# Patient Record
Sex: Female | Born: 1955 | Race: White | Hispanic: No | Marital: Married | Smoking: Never smoker
Health system: Southern US, Community
[De-identification: ages and names within clinical notes are randomized; demographics above are authoritative.]

## PROBLEM LIST (undated history)

## (undated) DIAGNOSIS — M1990 Unspecified osteoarthritis, unspecified site: Secondary | ICD-10-CM

## (undated) DIAGNOSIS — Z8049 Family history of malignant neoplasm of other genital organs: Secondary | ICD-10-CM

## (undated) DIAGNOSIS — N189 Chronic kidney disease, unspecified: Secondary | ICD-10-CM

## (undated) DIAGNOSIS — Z803 Family history of malignant neoplasm of breast: Secondary | ICD-10-CM

## (undated) DIAGNOSIS — Z8042 Family history of malignant neoplasm of prostate: Secondary | ICD-10-CM

## (undated) DIAGNOSIS — M797 Fibromyalgia: Secondary | ICD-10-CM

## (undated) DIAGNOSIS — Z801 Family history of malignant neoplasm of trachea, bronchus and lung: Secondary | ICD-10-CM

## (undated) DIAGNOSIS — G4730 Sleep apnea, unspecified: Secondary | ICD-10-CM

## (undated) DIAGNOSIS — K589 Irritable bowel syndrome without diarrhea: Secondary | ICD-10-CM

## (undated) DIAGNOSIS — R251 Tremor, unspecified: Secondary | ICD-10-CM

## (undated) DIAGNOSIS — Z800 Family history of malignant neoplasm of digestive organs: Secondary | ICD-10-CM

## (undated) DIAGNOSIS — R569 Unspecified convulsions: Secondary | ICD-10-CM

## (undated) DIAGNOSIS — E7800 Pure hypercholesterolemia, unspecified: Secondary | ICD-10-CM

## (undated) HISTORY — DX: Fibromyalgia: M79.7

## (undated) HISTORY — DX: Sleep apnea, unspecified: G47.30

## (undated) HISTORY — DX: Family history of malignant neoplasm of breast: Z80.3

## (undated) HISTORY — DX: Family history of malignant neoplasm of other genital organs: Z80.49

## (undated) HISTORY — DX: Irritable bowel syndrome, unspecified: K58.9

## (undated) HISTORY — DX: Tremor, unspecified: R25.1

## (undated) HISTORY — DX: Pure hypercholesterolemia, unspecified: E78.00

## (undated) HISTORY — DX: Unspecified convulsions: R56.9

## (undated) HISTORY — DX: Family history of malignant neoplasm of digestive organs: Z80.0

## (undated) HISTORY — DX: Unspecified osteoarthritis, unspecified site: M19.90

## (undated) HISTORY — DX: Chronic kidney disease, unspecified: N18.9

## (undated) HISTORY — DX: Family history of malignant neoplasm of prostate: Z80.42

## (undated) HISTORY — DX: Family history of malignant neoplasm of trachea, bronchus and lung: Z80.1

## (undated) MED FILL — Paclitaxel IV Conc 300 MG/50ML (6 MG/ML): INTRAVENOUS | Qty: 27 | Status: AC

## (undated) MED FILL — Dexamethasone Sodium Phosphate Inj 100 MG/10ML: INTRAMUSCULAR | Qty: 1 | Status: AC

---

## 2012-07-10 DIAGNOSIS — R531 Weakness: Secondary | ICD-10-CM | POA: Insufficient documentation

## 2012-07-10 DIAGNOSIS — R269 Unspecified abnormalities of gait and mobility: Secondary | ICD-10-CM

## 2012-07-10 HISTORY — DX: Weakness: R53.1

## 2012-07-10 HISTORY — DX: Unspecified abnormalities of gait and mobility: R26.9

## 2013-02-12 DIAGNOSIS — M47814 Spondylosis without myelopathy or radiculopathy, thoracic region: Secondary | ICD-10-CM | POA: Insufficient documentation

## 2013-02-12 DIAGNOSIS — B0229 Other postherpetic nervous system involvement: Secondary | ICD-10-CM | POA: Insufficient documentation

## 2013-02-12 DIAGNOSIS — IMO0002 Reserved for concepts with insufficient information to code with codable children: Secondary | ICD-10-CM

## 2013-02-12 DIAGNOSIS — M533 Sacrococcygeal disorders, not elsewhere classified: Secondary | ICD-10-CM

## 2013-02-12 DIAGNOSIS — M4714 Other spondylosis with myelopathy, thoracic region: Secondary | ICD-10-CM

## 2013-02-12 DIAGNOSIS — B0222 Postherpetic trigeminal neuralgia: Secondary | ICD-10-CM

## 2013-02-12 DIAGNOSIS — M961 Postlaminectomy syndrome, not elsewhere classified: Secondary | ICD-10-CM

## 2013-02-12 DIAGNOSIS — IMO0001 Reserved for inherently not codable concepts without codable children: Secondary | ICD-10-CM | POA: Insufficient documentation

## 2013-02-12 DIAGNOSIS — M546 Pain in thoracic spine: Secondary | ICD-10-CM

## 2013-02-12 HISTORY — DX: Postlaminectomy syndrome, not elsewhere classified: M96.1

## 2013-02-12 HISTORY — DX: Sacrococcygeal disorders, not elsewhere classified: M53.3

## 2013-02-12 HISTORY — DX: Reserved for inherently not codable concepts without codable children: IMO0001

## 2013-02-12 HISTORY — DX: Postherpetic trigeminal neuralgia: B02.22

## 2013-02-12 HISTORY — DX: Pain in thoracic spine: M54.6

## 2013-02-12 HISTORY — DX: Spondylosis without myelopathy or radiculopathy, thoracic region: M47.814

## 2013-02-12 HISTORY — DX: Other spondylosis with myelopathy, thoracic region: M47.14

## 2013-02-12 HISTORY — DX: Reserved for concepts with insufficient information to code with codable children: IMO0002

## 2013-02-12 HISTORY — DX: Other postherpetic nervous system involvement: B02.29

## 2013-05-29 DIAGNOSIS — N3946 Mixed incontinence: Secondary | ICD-10-CM | POA: Insufficient documentation

## 2013-05-29 HISTORY — DX: Mixed incontinence: N39.46

## 2013-08-30 DIAGNOSIS — G43909 Migraine, unspecified, not intractable, without status migrainosus: Secondary | ICD-10-CM | POA: Insufficient documentation

## 2013-08-30 DIAGNOSIS — G40909 Epilepsy, unspecified, not intractable, without status epilepticus: Secondary | ICD-10-CM

## 2013-08-30 DIAGNOSIS — J309 Allergic rhinitis, unspecified: Secondary | ICD-10-CM | POA: Insufficient documentation

## 2013-08-30 DIAGNOSIS — E785 Hyperlipidemia, unspecified: Secondary | ICD-10-CM | POA: Insufficient documentation

## 2013-08-30 DIAGNOSIS — M797 Fibromyalgia: Secondary | ICD-10-CM | POA: Insufficient documentation

## 2013-08-30 DIAGNOSIS — R262 Difficulty in walking, not elsewhere classified: Secondary | ICD-10-CM | POA: Insufficient documentation

## 2013-08-30 DIAGNOSIS — G4733 Obstructive sleep apnea (adult) (pediatric): Secondary | ICD-10-CM

## 2013-08-30 DIAGNOSIS — F418 Other specified anxiety disorders: Secondary | ICD-10-CM

## 2013-08-30 HISTORY — DX: Obstructive sleep apnea (adult) (pediatric): G47.33

## 2013-08-30 HISTORY — DX: Epilepsy, unspecified, not intractable, without status epilepticus: G40.909

## 2013-08-30 HISTORY — DX: Migraine, unspecified, not intractable, without status migrainosus: G43.909

## 2013-08-30 HISTORY — DX: Difficulty in walking, not elsewhere classified: R26.2

## 2013-08-30 HISTORY — DX: Allergic rhinitis, unspecified: J30.9

## 2013-08-30 HISTORY — DX: Other specified anxiety disorders: F41.8

## 2013-08-30 HISTORY — DX: Hyperlipidemia, unspecified: E78.5

## 2013-10-16 ENCOUNTER — Ambulatory Visit (HOSPITAL_COMMUNITY): Payer: Self-pay | Admitting: Psychiatry

## 2015-03-24 DIAGNOSIS — M533 Sacrococcygeal disorders, not elsewhere classified: Secondary | ICD-10-CM | POA: Insufficient documentation

## 2015-03-24 HISTORY — DX: Sacrococcygeal disorders, not elsewhere classified: M53.3

## 2015-09-07 DIAGNOSIS — Z9181 History of falling: Secondary | ICD-10-CM

## 2015-09-07 DIAGNOSIS — G4700 Insomnia, unspecified: Secondary | ICD-10-CM | POA: Insufficient documentation

## 2015-09-07 HISTORY — DX: History of falling: Z91.81

## 2015-09-07 HISTORY — DX: Insomnia, unspecified: G47.00

## 2017-01-02 HISTORY — PX: BREAST LUMPECTOMY: SHX2

## 2017-05-21 DIAGNOSIS — N6321 Unspecified lump in the left breast, upper outer quadrant: Secondary | ICD-10-CM | POA: Insufficient documentation

## 2017-05-21 DIAGNOSIS — N6091 Unspecified benign mammary dysplasia of right breast: Secondary | ICD-10-CM

## 2017-05-21 HISTORY — DX: Unspecified benign mammary dysplasia of right breast: N60.91

## 2017-05-21 HISTORY — DX: Unspecified lump in the left breast, upper outer quadrant: N63.21

## 2017-05-22 DIAGNOSIS — G2581 Restless legs syndrome: Secondary | ICD-10-CM | POA: Insufficient documentation

## 2017-05-22 HISTORY — DX: Restless legs syndrome: G25.81

## 2017-06-01 DIAGNOSIS — Z01818 Encounter for other preprocedural examination: Secondary | ICD-10-CM | POA: Diagnosis not present

## 2017-06-19 DIAGNOSIS — Z09 Encounter for follow-up examination after completed treatment for conditions other than malignant neoplasm: Secondary | ICD-10-CM | POA: Insufficient documentation

## 2017-06-19 HISTORY — DX: Encounter for follow-up examination after completed treatment for conditions other than malignant neoplasm: Z09

## 2017-07-03 DIAGNOSIS — G4719 Other hypersomnia: Secondary | ICD-10-CM

## 2017-07-03 HISTORY — DX: Other hypersomnia: G47.19

## 2017-10-26 DIAGNOSIS — Z818 Family history of other mental and behavioral disorders: Secondary | ICD-10-CM | POA: Insufficient documentation

## 2017-10-26 HISTORY — DX: Family history of other mental and behavioral disorders: Z81.8

## 2017-12-20 ENCOUNTER — Encounter: Payer: Self-pay | Admitting: Gastroenterology

## 2018-03-13 HISTORY — PX: BLADDER SUSPENSION: SHX72

## 2018-03-13 HISTORY — PX: BLADDER REPAIR: SHX76

## 2018-11-05 DIAGNOSIS — R928 Other abnormal and inconclusive findings on diagnostic imaging of breast: Secondary | ICD-10-CM

## 2018-11-05 HISTORY — DX: Other abnormal and inconclusive findings on diagnostic imaging of breast: R92.8

## 2018-12-19 ENCOUNTER — Encounter: Payer: Self-pay | Admitting: Gastroenterology

## 2019-01-02 ENCOUNTER — Other Ambulatory Visit: Payer: Self-pay

## 2019-01-02 ENCOUNTER — Ambulatory Visit (INDEPENDENT_AMBULATORY_CARE_PROVIDER_SITE_OTHER): Payer: Medicare Other | Admitting: Allergy and Immunology

## 2019-01-02 ENCOUNTER — Encounter: Payer: Self-pay | Admitting: Allergy and Immunology

## 2019-01-02 VITALS — BP 108/84 | HR 104 | Resp 16 | Ht 64.75 in | Wt 203.0 lb

## 2019-01-02 DIAGNOSIS — T782XXD Anaphylactic shock, unspecified, subsequent encounter: Secondary | ICD-10-CM | POA: Diagnosis not present

## 2019-01-02 DIAGNOSIS — T50905D Adverse effect of unspecified drugs, medicaments and biological substances, subsequent encounter: Secondary | ICD-10-CM

## 2019-01-02 NOTE — Progress Notes (Signed)
[facility] [place] [facility] [place]   Follow-up Note  Referring Provider: No ref. provider found Primary Provider: Enid Skeens., MD Date of Office Visit: 01/02/2019  Subjective:   Brandy Maxwell (DOB: 08-16-1955) is a 63 y.o. female who returns to the [facility] on 01/02/2019 in re-evaluation of the following:  HPI: [name] presents to this clinic in evaluation of a recent allergic reaction.  Apparently 1 week ago after her first dose of nitrofurantoin she developed a rather significant reaction manifested as global urticaria and global itching and then she passed out and had a low blood pressure and diarrhea and was evaluated emergency room and admitted to the hospital for treatment including the administration of injectable epinephrine and steroids.  This was her first exposure to nitrofurantoin other than having a few doses earlier this year.  Fortunately, her reaction resolved and she has not had any sequela.  However, since her discharge from the hospital she has been having rather significant pelvic pain.  Her nitrofurantoin was administered for a urinary tract infection and she thinks that she did receive other antibiotics to address this infection when in the hospital.  She has an appointment to see her urologist who performed a bladder sling surgery in March 2020 sometime in the next week or 2.  In October 2020 she apparently developed a sensation of throat swelling and difficulty swallowing and once again required evaluation emergency room.  With this episode she did not have any associated systemic or constitutional symptoms and there was no obvious provoking factor giving rise to that issue.  As well, she has a very complex medical history dating back about 8 years ago.  Apparently she was diagnosed with "shingles in her spine" and ever since that point in time she has had rather significant neurological issues including urinary incontinence,  global muscle weakness, vertigo, chronic pain syndrome, and a fair amount of cognitive difficulties that has been evaluated by multiple medical centers.  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  And she developed sleep apnea around that point in time.  In addition, she has reflux with regurgitation that she treats with Tums on occasion.  She also notes the fact that her voice gets very weak the more that she talks throughout the day.  Allergies as of 01/02/2019      Reactions   Nitrofurantoin Anaphylaxis   Sulfa Antibiotics Rash, Other (See Comments), Hives   Other reaction(s): Rash      Medication List      b complex vitamins capsule Take by mouth.   chlorhexidine 0.12 % solution Commonly known as: PERIDEX USE AS DIRECTED ON BOTTLE   [employer]S VITAMIN B12 1000 MCG tablet Generic drug: cyanocobalamin Take 1,000 mcg by mouth daily.   DULoxetine 30 MG capsule Commonly known as: CYMBALTA Take 90 mg by mouth at bedtime.   EPINEPHrine 0.3 mg/0.3 mL Soaj injection Commonly known as: EPI-PEN PLEASE SEE ATTACHED FOR DETAILED DIRECTIONS   fenofibrate 160 MG tablet Take 160 mg by mouth daily.   fluticasone 50 MCG/ACT nasal spray Commonly known as: FLONASE 1 spray.   gabapentin 600 MG tablet Commonly known as: NEURONTIN Take 1,200 mg by mouth 3 (three) times daily.   lidocaine 5 % Commonly known as: [place] onto the skin.   Linzess 72 MCG capsule Generic drug: linaclotide Take 72 mcg by mouth daily.   modafinil 200 MG tablet  Commonly known as: PROVIGIL Take 200 mg by mouth 2 (two) times daily.   mupirocin cream 2 % Commonly known as: BACTROBAN APPLY TO AFFECTED AREA 3 TIMES A DAY FOR 7 DAYS   oxybutynin 10 MG 24 hr tablet Commonly known as: DITROPAN-XL Take 10 mg by mouth daily.   pramipexole 0.25 MG tablet Commonly known as: MIRAPEX Take 0.5 mg by mouth at bedtime.   traMADol 50 MG  tablet Commonly known as: ULTRAM Take 50 mg by mouth 2 (two) times daily as needed.   Vitamin D (Ergocalciferol) 1.25 MG (50000 UT) Caps capsule Commonly known as: DRISDOL TAKE 1 CAPSULE BY MOUTH ONCE A WEEK FOR 30 DAYS       Past Medical History:  Diagnosis Date  . Fibromyalgia   . Hypercholesteremia   . IBS (irritable bowel syndrome)   . Seizures ([place])   . Tremors of nervous system     Past Surgical History:  Procedure Laterality Date  . BLADDER REPAIR  03/13/2018  . BLADDER SUSPENSION  03/13/2018  . BREAST LUMPECTOMY  2019  . LAMINECTOMY  1998    Review of systems negative except as noted in HPI / PMHx or noted below:  Review of Systems  Constitutional: Negative.   HENT: Negative.   Eyes: Negative.   Respiratory: Negative.   Cardiovascular: Negative.   Gastrointestinal: Negative.   Genitourinary: Negative.   Musculoskeletal: Negative.   Skin: Negative.   Neurological: Negative.   Endo/Heme/Allergies: Negative.   Psychiatric/Behavioral: Negative.      Objective:   Vitals:   01/02/19 1411  BP: 108/84  Pulse: (!) 104  Resp: 16  SpO2: 97%   Height: 5' 4.75" (164.5 cm)  Weight: 203 lb (92.1 kg)   Physical Exam Constitutional:      Appearance: She is not diaphoretic.     Comments: Obvious difficulty with muscle weakness while ambulating using a cane.  HENT:     Head: Normocephalic.     Right Ear: Tympanic membrane, ear canal and external ear normal.     Left Ear: Tympanic membrane, ear canal and external ear normal.     Nose: Nose normal. No mucosal edema or rhinorrhea.     Mouth/Throat:     Pharynx: Uvula midline. No oropharyngeal exudate.  Eyes:     Conjunctiva/sclera: Conjunctivae normal.  Neck:     Thyroid: No thyromegaly.     Trachea: Trachea normal. No tracheal tenderness or tracheal deviation.  Cardiovascular:     Rate and Rhythm: Normal rate and regular rhythm.     Heart sounds: Normal heart sounds, S1 normal and S2 normal. No murmur.    Pulmonary:     Effort: No respiratory distress.     Breath sounds: Normal breath sounds. No stridor. No wheezing or rales.  Lymphadenopathy:     Head:     Right side of head: No tonsillar adenopathy.     Left side of head: No tonsillar adenopathy.     Cervical: No cervical adenopathy.  Skin:    Findings: No erythema or rash.     Nails: There is no clubbing.  Neurological:     Mental Status: She is alert.     Diagnostics: none  Assessment and Plan:   No diagnosis found.  There are no Patient Instructions on file for this visit.  Allena Katz, MD Allergy / Immunology [place]

## 2019-01-06 ENCOUNTER — Encounter: Payer: Self-pay | Admitting: Allergy and Immunology

## 2019-01-06 NOTE — Progress Notes (Signed)
[facility] [place]   NEW PATIENT NOTE  Referring Provider: No ref. provider found Primary Provider: Enid Skeens., MD Date of office visit: 01/02/2019    Subjective:   Chief Complaint:  Brandy Maxwell (DOB: 02-May-1955) is a 64 y.o. female who presents to the clinic on 01/02/2019 with a chief complaint of Anaphylaxis and Allergic Reaction .  HPI:  Brandy Maxwell presents to this clinic in evaluation of a recent allergic reaction.  Apparently 1 week ago, after her first dose of nitrofurantoin, she developed a rather significant reaction manifested as global urticaria and global itching and then she passed out and had a low blood pressure and diarrhea and was evaluated emergency room and admitted to the hospital for treatment including the administration of injectable epinephrine and steroids.  This was her first exposure to nitrofurantoin other than having a few doses earlier this year.  Fortunately, her reaction resolved and she has not had any sequela.  However, since her discharge from the hospital she has been having rather significant pelvic pain.  Her nitrofurantoin was administered for a urinary tract infection and she thinks that she did receive other antibiotics to address this infection when in the hospital.  She has an appointment to see her urologist who performed a bladder sling surgery in March 2020 sometime in the next week or 2.  In October 2020 she apparently developed a sensation of throat swelling and difficulty swallowing and once again required evaluation emergency room.  With this episode she did not have any associated systemic or constitutional symptoms and there was no obvious provoking factor giving rise to that issue.  As well, she has a very complex medical history dating back about 8 years ago.  Apparently she was diagnosed with "shingles in her spine" and ever since that point in time she has had rather significant neurological  issues including urinary incontinence, global muscle weakness, vertigo, chronic pain syndrome, and a fair amount of cognitive difficulties that has been evaluated by multiple medical centers.  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  And she developed sleep apnea around that point in time.  In addition, she has reflux with regurgitation that she treats with Tums on occasion.  She also notes the fact that her voice gets very weak the more that she talks throughout the day.   Past Medical History:  Diagnosis Date  . Fibromyalgia   . Hypercholesteremia   . IBS (irritable bowel syndrome)   . Seizures ([place])   . Tremors of nervous system     Past Surgical History:  Procedure Laterality Date  . BLADDER REPAIR  03/13/2018  . BLADDER SUSPENSION  03/13/2018  . BREAST LUMPECTOMY  2019  . LAMINECTOMY  1998    Allergies as of 01/02/2019      Reactions   Nitrofurantoin Anaphylaxis   Sulfa Antibiotics Rash, Other (See Comments), Hives   Other reaction(s): Rash      Medication List      b complex vitamins capsule Take by mouth.   chlorhexidine 0.12 % solution Commonly known as: PERIDEX USE AS DIRECTED ON BOTTLE   CVS VITAMIN B12 1000 MCG tablet Generic drug: cyanocobalamin Take 1,000 mcg by mouth daily.   DULoxetine 30 MG capsule Commonly known as: CYMBALTA Take 90 mg by mouth at bedtime.   EPINEPHrine 0.3 mg/0.3 mL Soaj injection Commonly known as: EPI-PEN PLEASE SEE ATTACHED FOR  DETAILED DIRECTIONS   fenofibrate 160 MG tablet Take 160 mg by mouth daily.   fluticasone 50 MCG/ACT nasal spray Commonly known as: FLONASE 1 spray.   gabapentin 600 MG tablet Commonly known as: NEURONTIN Take 1,200 mg by mouth 3 (three) times daily.   lidocaine 5 % Commonly known as: [place] onto the skin.   Linzess 72 MCG capsule Generic drug: linaclotide Take 72 mcg by mouth daily.   modafinil 200 MG  tablet Commonly known as: PROVIGIL Take 200 mg by mouth 2 (two) times daily.   mupirocin cream 2 % Commonly known as: BACTROBAN APPLY TO AFFECTED AREA 3 TIMES A DAY FOR 7 DAYS   oxybutynin 10 MG 24 hr tablet Commonly known as: DITROPAN-XL Take 10 mg by mouth daily.   pramipexole 0.25 MG tablet Commonly known as: MIRAPEX Take 0.5 mg by mouth at bedtime.   traMADol 50 MG tablet Commonly known as: ULTRAM Take 50 mg by mouth 2 (two) times daily as needed.   Vitamin D (Ergocalciferol) 1.25 MG (50000 UT) Caps capsule Commonly known as: DRISDOL TAKE 1 CAPSULE BY MOUTH ONCE A WEEK FOR 30 DAYS       Review of systems negative except as noted in HPI / PMHx or noted below:  Review of Systems  Constitutional: Negative.   HENT: Negative.   Eyes: Negative.   Respiratory: Negative.   Cardiovascular: Negative.   Gastrointestinal: Negative.   Genitourinary: Negative.   Musculoskeletal: Negative.   Skin: Negative.   Neurological: Negative.   Endo/Heme/Allergies: Negative.   Psychiatric/Behavioral: Negative.     Family History  Problem Relation Age of Onset  . Hypercholesterolemia Mother   . Alzheimer's disease Mother   . Heart Problems Father   . Clotting disorder Father   . Alzheimer's disease Father   . Allergic rhinitis Sister     Social History   Socioeconomic History  . Marital status: Married    Spouse name: Not on file  . Number of children: Not on file  . Years of education: Not on file  . Highest education level: Not on file  Occupational History  . Not on file  Tobacco Use  . Smoking status: Never Smoker  . Smokeless tobacco: Never Used  Substance and Sexual Activity  . Alcohol use: Never  . Drug use: Never  . Sexual activity: Not on file  Other Topics Concern  . Not on file  Social History Narrative  . Not on file   Social Determinants of Health   Financial Resource Strain:   . Difficulty of Paying Living Expenses: Not on file  Food Insecurity:    . Worried About Charity fundraiser in the Last Year: Not on file  . Ran Out of Food in the Last Year: Not on file  Transportation Needs:   . Lack of Transportation (Medical): Not on file  . Lack of Transportation (Non-Medical): Not on file  Physical Activity:   . Days of Exercise per Week: Not on file  . Minutes of Exercise per Session: Not on file  Stress:   . Feeling of Stress : Not on file  Social Connections:   . Frequency of Communication with Friends and Family: Not on file  . Frequency of Social Gatherings with Friends and Family: Not on file  . Attends Religious Services: Not on file  . Active Member of Clubs or Organizations: Not on file  . Attends Archivist Meetings: Not on file  . Marital Status: Not  on file  Intimate Partner Violence:   . Fear of Current or Ex-Partner: Not on file  . Emotionally Abused: Not on file  . Physically Abused: Not on file  . Sexually Abused: Not on file    Environmental and Social history  Lives in a house with a dry environment, no animals located inside the household, no carpet in the bedroom, no plastic on the bed, no plastic on the pillow, no smokers located inside the household.  Objective:   Vitals:   01/02/19 1411  BP: 108/84  Pulse: (!) 104  Resp: 16  SpO2: 97%   Height: 5' 4.75" (164.5 cm) Weight: 203 lb (92.1 kg)  Physical Exam Constitutional:      Appearance: She is not diaphoretic.     Comments: Obvious weakness ambulating with a cane  HENT:     Head: Normocephalic.     Right Ear: Tympanic membrane, ear canal and external ear normal.     Left Ear: Tympanic membrane, ear canal and external ear normal.     Nose: Nose normal. No mucosal edema or rhinorrhea.     Mouth/Throat:     Pharynx: Uvula midline. No oropharyngeal exudate.  Eyes:     Conjunctiva/sclera: Conjunctivae normal.  Neck:     Thyroid: No thyromegaly.     Trachea: Trachea normal. No tracheal tenderness or tracheal deviation.    Cardiovascular:     Rate and Rhythm: Normal rate and regular rhythm.     Heart sounds: Normal heart sounds, S1 normal and S2 normal. No murmur.  Pulmonary:     Effort: No respiratory distress.     Breath sounds: Normal breath sounds. No stridor. No wheezing or rales.  Lymphadenopathy:     Head:     Right side of head: No tonsillar adenopathy.     Left side of head: No tonsillar adenopathy.     Cervical: No cervical adenopathy.  Skin:    Findings: No erythema or rash.     Nails: There is no clubbing.  Neurological:     Mental Status: She is alert.     Diagnostics: Allergy skin tests were not performed.   Assessment and Plan:    1. Anaphylaxis, subsequent encounter   2. Adverse effect of drug, subsequent encounter    [place] had an obvious episode of anaphylaxis directed against her nitrofurantoin and this medication should never be used again in the future and she should be labeled as severely allergic to this agent.  I have informed her that if she has any allergic reactions in the future unrelated to the use of this medication then she will require further evaluation for her potentially overactive immune system.  The issue of why she has very significant neurological and muscular issues is not well-defined.  She has had extensive evaluation with neurology including nerve conduction velocity studies and screening blood test for a paraneoplastic syndrome and myasthenia gravis all of which have been negative.  I cannot really offer anything at this point in time regarding further evaluation for the etiologic factor responsible for her neurological issues.  Allena Katz, MD Allergy / Immunology [place]

## 2019-01-08 ENCOUNTER — Encounter: Payer: Self-pay | Admitting: Gastroenterology

## 2019-01-20 ENCOUNTER — Encounter: Payer: Self-pay | Admitting: Gastroenterology

## 2019-01-31 DIAGNOSIS — Z79899 Other long term (current) drug therapy: Secondary | ICD-10-CM

## 2019-01-31 HISTORY — DX: Other long term (current) drug therapy: Z79.899

## 2019-02-03 ENCOUNTER — Encounter: Payer: Self-pay | Admitting: Gastroenterology

## 2019-02-03 ENCOUNTER — Telehealth (INDEPENDENT_AMBULATORY_CARE_PROVIDER_SITE_OTHER): Payer: Medicare Other | Admitting: Gastroenterology

## 2019-02-03 ENCOUNTER — Other Ambulatory Visit: Payer: Self-pay

## 2019-02-03 VITALS — Ht 60.0 in | Wt 190.0 lb

## 2019-02-03 DIAGNOSIS — K581 Irritable bowel syndrome with constipation: Secondary | ICD-10-CM | POA: Diagnosis not present

## 2019-02-03 DIAGNOSIS — G8929 Other chronic pain: Secondary | ICD-10-CM

## 2019-02-03 DIAGNOSIS — Z800 Family history of malignant neoplasm of digestive organs: Secondary | ICD-10-CM | POA: Diagnosis not present

## 2019-02-03 DIAGNOSIS — R102 Pelvic and perineal pain: Secondary | ICD-10-CM | POA: Diagnosis not present

## 2019-02-03 DIAGNOSIS — Z01818 Encounter for other preprocedural examination: Secondary | ICD-10-CM | POA: Diagnosis not present

## 2019-02-03 MED ORDER — SUPREP BOWEL PREP KIT 17.5-3.13-1.6 GM/177ML PO SOLN: 1.0000 | ORAL | 0 refills | Status: DC

## 2019-02-03 NOTE — Patient Instructions (Signed)
If you are age 63 or older, your body mass index should be between 23-30. Your Body mass index is 37.11 kg/m. If this is out of the aforementioned range listed, please consider follow up with your Primary Care Provider.  If you are age 2 or younger, your body mass index should be between 19-25. Your Body mass index is 37.11 kg/m. If this is out of the aformentioned range listed, please consider follow up with your Primary Care Provider.   It has been recommended to you by your physician that you have a(n) Colonoscopy completed. Per your request, we did not schedule the procedure(s) today. Please contact our office at [phone] should you decide to have the procedure completed. You will be scheduled for a pre-visit and procedure at that time.  Two days before your procedure: Mix 3 packs (or capfuls) of Miralax in 48 ounces of clear liquid and drink at 6pm.  Continue Linzess   Please purchase the following medications over the counter and take as directed: Miralax 17 grams once daily.    Kegel Exercises  Kegel exercises can help strengthen your pelvic floor muscles. The pelvic floor is a group of muscles that support your rectum, small intestine, and bladder. In females, pelvic floor muscles also help support the womb (uterus). These muscles help you control the flow of urine and stool. Kegel exercises are painless and simple, and they do not require any equipment. Your provider may suggest Kegel exercises to:  Improve bladder and bowel control.  Improve sexual response.  Improve weak pelvic floor muscles after surgery to remove the uterus (hysterectomy) or pregnancy (females).  Improve weak pelvic floor muscles after prostate gland removal or surgery (males). Kegel exercises involve squeezing your pelvic floor muscles, which are the same muscles you squeeze when you try to stop the flow of urine or keep from passing gas. The exercises can be done while sitting, standing, or lying down,  but it is best to vary your position. Exercises How to do Kegel exercises: 1. Squeeze your pelvic floor muscles tight. You should feel a tight lift in your rectal area. If you are a female, you should also feel a tightness in your vaginal area. Keep your stomach, buttocks, and legs relaxed. 2. Hold the muscles tight for up to 10 seconds. 3. Breathe normally. 4. Relax your muscles. 5. Repeat as told by your health care provider. Repeat this exercise daily as told by your health care provider. Continue to do this exercise for at least 4-6 weeks, or for as long as told by your health care provider. You may be referred to a physical therapist who can help you learn more about how to do Kegel exercises. Depending on your condition, your health care provider may recommend:  Varying how long you squeeze your muscles.  Doing several sets of exercises every day.  Doing exercises for several weeks.  Making Kegel exercises a part of your regular exercise routine. This information is not intended to replace advice given to you by your health care provider. Make sure you discuss any questions you have with your health care provider. Document Revised: 08/08/2017 Document Reviewed: 08/08/2017 Elsevier Patient Education  2020 [employer].   Thank you,  Dr. Jackquline Denmark

## 2019-02-03 NOTE — Progress Notes (Signed)
Chief Complaint:   Referring Provider:  Enid Skeens., MD      ASSESSMENT AND PLAN;     #1. Chronic pelvic pain. S/P bladder urethral sling 03/2018.  Being followed by urology (Dr. Venia Minks)  #2. IBS with constipation   #3. FH colon cancer MGM.  Plan: -Linzess 72 mcg p.o. once a day to continue. -Add MiraLAX 17 g p.o. once a day. -Kegel's exercises -Proceed with colonoscopy (2 day prep) for further evaluation as recommended by Dr. Wendie Agreste.  I have discussed risks and benefits. -If still with urinary problems, reappt with [facility] for UDS/Botox bladder injection. -If still with problems and the above WU is negative, proceed with CT scan abdo/pelvis.    HPI:    Brandy Maxwell is a 64 y.o. female with chronic fatigue syndrome, fibromyalgia, chronic pain syndrome being followed by pain clinic, H LD, OSA on CPAP, seizure disorder with longstanding urinary problems including urinary incontinence SP retropubic mid urethral sling on 03/13/2018 with continued urge incontinence and chronic pelvic pain.  Also has longstanding history of constipation with pellet-like stools.  Most recently she has been taking Linzess 72 mcg p.o. once a day and would still be constipated with bowel movements at the frequency of 1 per 2 to 3 days.  She occasionally would have diarrhea.  No melena or hematochezia.  She attributes part of the pelvic pain to "colonic spasms".  Very much concerned as her maternal grandmother had colon cancer.  Advised to get colonoscopy performed.  Past GI procedures: -Colonoscopy 12/2012 ([facility])-mild sigmoid diverticulosis, small internal hemorrhoids.  Advised repeat in 46yr due to [place] Past Medical History:  Diagnosis Date  . Fibromyalgia   . Hypercholesteremia   . IBS (irritable bowel syndrome)   . Seizures ([place])   . Tremors of nervous system     Past Surgical History:  Procedure Laterality Date  . BLADDER REPAIR  03/13/2018  . BLADDER SUSPENSION   03/13/2018  . BREAST LUMPECTOMY  2019  . COLONOSCOPY  12/20/2012   Left colonic diverticulosis predominantly in the sigmoid colon. Small internal hemmorhoids. Otherwise normal colonoscopy.   Marland Kitchen LAMINECTOMY  1998    Family History  Problem Relation Age of Onset  . Hypercholesterolemia Mother   . Alzheimer's disease Mother   . Heart Problems Father   . Clotting disorder Father   . Alzheimer's disease Father   . Allergic rhinitis Sister   . Colon cancer Maternal Grandfather   . Uterine cancer Maternal Aunt   . Esophageal cancer Neg Hx     Social History   Tobacco Use  . Smoking status: Never Smoker  . Smokeless tobacco: Never Used  Substance Use Topics  . Alcohol use: Never  . Drug use: Never    Current Outpatient Medications  Medication Sig Dispense Refill  . b complex vitamins capsule Take 1 capsule by mouth daily.     . chlorhexidine (PERIDEX) 0.12 % solution Use as directed in the mouth or throat daily.     . CVS VITAMIN B12 1000 MCG tablet Take 1,000 mcg by mouth daily.    . DULoxetine (CYMBALTA) 30 MG capsule Take 90 mg by mouth at bedtime.    . fenofibrate 160 MG tablet Take 160 mg by mouth daily.    . fluticasone (FLONASE) 50 MCG/ACT nasal spray Place 1 spray into both nostrils as needed.     . gabapentin (NEURONTIN) 600 MG tablet Take 1,200 mg by mouth 3 (three) times daily.    Marland Kitchen  lidocaine (LIDODERM) 5 % Place 1 patch onto the skin as needed.     Marland Kitchen LINZESS 72 MCG capsule Take 72 mcg by mouth daily.    . modafinil (PROVIGIL) 200 MG tablet Take 200 mg by mouth 2 (two) times daily.    . mupirocin cream (BACTROBAN) 2 % Apply 1 application topically as needed.     Marland Kitchen oxybutynin (DITROPAN-XL) 10 MG 24 hr tablet Take 10 mg by mouth daily.    . pramipexole (MIRAPEX) 0.25 MG tablet Take 0.5 mg by mouth at bedtime.    . traMADol (ULTRAM) 50 MG tablet Take 50 mg by mouth 2 (two) times daily as needed.    . Vitamin D, Ergocalciferol, (DRISDOL) 1.25 MG (50000 UT) CAPS capsule  TAKE 1 CAPSULE BY MOUTH ONCE A WEEK FOR 30 DAYS    . EPINEPHrine 0.3 mg/0.3 mL IJ SOAJ injection PLEASE SEE ATTACHED FOR DETAILED DIRECTIONS     No current facility-administered medications for this visit.    Allergies  Allergen Reactions  . Nitrofurantoin Anaphylaxis  . Sulfa Antibiotics Rash, Other (See Comments) and Hives    Other reaction(s): Rash     Review of Systems:  Constitutional: Denies fever, chills, diaphoresis, appetite change and has fatigue.  HEENT: neg   Respiratory: Denies SOB, DOE, cough, chest tightness,  and wheezing.   Cardiovascular: Denies chest pain, palpitations and leg swelling.  Genitourinary: Denies dysuria, urgency, frequency, hematuria, flank pain and difficulty urinating.  Musculoskeletal: has myalgias, back pain, joint swelling, arthralgias and gait problem.  Skin: No rash.  Neurological: Denies dizziness, had seizures, syncope, weakness, light-headedness, numbness and headaches.  Hematological: Denies adenopathy. Easy bruising, personal or family bleeding history  Psychiatric/Behavioral: has  anxiety or depression     Physical Exam:    Ht 5' (1.524 m)   Wt 190 lb (86.2 kg)   BMI 37.11 kg/m  Wt Readings from Last 3 Encounters:  02/03/19 190 lb (86.2 kg)  01/02/19 203 lb (92.1 kg)   Not examined since it was a televisit  She was originally scheduled to have visit in person.  However, she got confused with the appointments and went to [facility].  Hence we did televisit.  I connected with  Brandy Maxwell on 02/03/19 by a (video not available) phone telemedicine application and verified that I am speaking with the correct person using two identifiers.   I discussed the limitations of evaluation and management by telemedicine. The patient expressed understanding and agreed to proceed.  Extensive notes were reviewed     Carmell Austria, MD 02/03/2019, 2:24 PM  Cc: Enid Skeens., MD

## 2019-02-12 ENCOUNTER — Ambulatory Visit (AMBULATORY_SURGERY_CENTER): Payer: Self-pay | Admitting: *Deleted

## 2019-02-12 ENCOUNTER — Other Ambulatory Visit: Payer: Self-pay

## 2019-02-12 VITALS — Temp 97.6°F | Ht 65.0 in | Wt 195.0 lb

## 2019-02-12 DIAGNOSIS — Z01818 Encounter for other preprocedural examination: Secondary | ICD-10-CM

## 2019-02-12 DIAGNOSIS — Z1211 Encounter for screening for malignant neoplasm of colon: Secondary | ICD-10-CM

## 2019-02-12 NOTE — Progress Notes (Signed)

## 2019-02-21 ENCOUNTER — Other Ambulatory Visit: Payer: Self-pay

## 2019-02-21 ENCOUNTER — Other Ambulatory Visit: Payer: Self-pay | Admitting: Gastroenterology

## 2019-02-21 ENCOUNTER — Telehealth: Payer: Self-pay | Admitting: Gastroenterology

## 2019-02-21 ENCOUNTER — Ambulatory Visit (INDEPENDENT_AMBULATORY_CARE_PROVIDER_SITE_OTHER): Payer: Medicare Other

## 2019-02-21 DIAGNOSIS — Z1159 Encounter for screening for other viral diseases: Secondary | ICD-10-CM

## 2019-02-21 NOTE — Telephone Encounter (Signed)
Sent  Prep instructions via my chart  Called pt- informed resent in my chart

## 2019-02-21 NOTE — Telephone Encounter (Signed)
Pt is scheduled for a colon and stated that she has lost her instructions.  Pt requested for instructions to be resent through [facility].

## 2019-02-22 LAB — SARS CORONAVIRUS 2 (TAT 6-24 HRS): SARS Coronavirus 2: NEGATIVE

## 2019-02-25 ENCOUNTER — Encounter: Payer: Self-pay | Admitting: Gastroenterology

## 2019-02-25 ENCOUNTER — Other Ambulatory Visit: Payer: Self-pay

## 2019-02-25 ENCOUNTER — Ambulatory Visit (AMBULATORY_SURGERY_CENTER): Payer: Medicare Other | Admitting: Gastroenterology

## 2019-02-25 VITALS — BP 108/57 | HR 98 | Temp 97.1°F | Resp 14 | Ht 65.0 in | Wt 195.0 lb

## 2019-02-25 DIAGNOSIS — Z800 Family history of malignant neoplasm of digestive organs: Secondary | ICD-10-CM | POA: Diagnosis not present

## 2019-02-25 DIAGNOSIS — Z1211 Encounter for screening for malignant neoplasm of colon: Secondary | ICD-10-CM

## 2019-02-25 MED ORDER — SODIUM CHLORIDE 0.9 % IV SOLN: 500.0000 mL | Freq: Once | INTRAVENOUS | Status: DC

## 2019-02-25 NOTE — Patient Instructions (Signed)
YOU HAD AN ENDOSCOPIC PROCEDURE TODAY AT [facility]:   Refer to the procedure report that was given to you for any specific questions about what was found during the examination.  If the procedure report does not answer your questions, please call your gastroenterologist to clarify.  If you requested that your care partner not be given the details of your procedure findings, then the procedure report has been included in a sealed envelope for you to review at your convenience later.  YOU SHOULD EXPECT: Some feelings of bloating in the abdomen. Passage of more gas than usual.  Walking can help get rid of the air that was put into your GI tract during the procedure and reduce the bloating. If you had a lower endoscopy (such as a colonoscopy or flexible sigmoidoscopy) you may notice spotting of blood in your stool or on the toilet paper. If you underwent a bowel prep for your procedure, you may not have a normal bowel movement for a few days.  Please Note:  You might notice some irritation and congestion in your nose or some drainage.  This is from the oxygen used during your procedure.  There is no need for concern and it should clear up in a day or so.  SYMPTOMS TO REPORT IMMEDIATELY:   Following lower endoscopy (colonoscopy or flexible sigmoidoscopy):  Excessive amounts of blood in the stool  Significant tenderness or worsening of abdominal pains  Swelling of the abdomen that is new, acute  Fever of 100F or higher   For urgent or emergent issues, a gastroenterologist can be reached at any hour by calling [phone].   DIET:  Please follow a High Fiber Diet (see handout given to you by your recovery nurse).We do recommend a small meal at first, but then you may proceed to your regular diet.  Drink plenty of fluids but you should avoid alcoholic beverages for 24 hours.  MEDICATIONS: Continue present medications.  Follow up with Dr. Lyndel Safe in his office as  needed.  ACTIVITY:  You should plan to take it easy for the rest of today and you should NOT DRIVE or use heavy machinery until tomorrow (because of the sedation medicines used during the test).    FOLLOW UP: Our staff will call the number listed on your records 48-72 hours following your procedure to check on you and address any questions or concerns that you may have regarding the information given to you following your procedure. If we do not reach you, we will leave a message.  We will attempt to reach you two times.  During this call, we will ask if you have developed any symptoms of COVID 19. If you develop any symptoms (ie: fever, flu-like symptoms, shortness of breath, cough etc.) before then, please call [phone].  If you test positive for Covid 19 in the 2 weeks post procedure, please call and report this information to [place].    If any biopsies were taken you will be contacted by phone or by letter within the next 1-3 weeks.  Please call us at [phone] if you have not heard about the biopsies in 3 weeks.   Thank you for allowing [place] to provide for your healthcare needs today.   SIGNATURES/CONFIDENTIALITY: You and/or your care partner have signed paperwork which will be entered into your electronic medical record.  These signatures attest to the fact that that the information above on your After Visit Summary has been reviewed and is understood.  Full responsibility of the confidentiality of this discharge information lies with you and/or your care-partner.

## 2019-02-25 NOTE — Progress Notes (Signed)
PT taken to PACU. Monitors in place. VSS. Report given to RN. 

## 2019-02-25 NOTE — Op Note (Signed)
[facility] Patient Name: Brandy Maxwell Procedure Date: 02/25/2019 10:56 AM MRN: [id number] Endoscopist: Jackquline Denmark , MD Age: 64 Referring MD:  Date of Birth: 1955/11/23 Gender: Female Account #: [id number] Procedure:                Colonoscopy Indications:              Screening for colorectal malignant neoplasm. FH of                            colon cancer in a second-degree relative. Medicines:                Monitored Anesthesia Care Procedure:                Pre-Anesthesia Assessment:                           - Prior to the procedure, a History and Physical                            was performed, and patient medications and                            allergies were reviewed. The patient's tolerance of                            previous anesthesia was also reviewed. The risks                            and benefits of the procedure and the sedation                            options and risks were discussed with the patient.                            All questions were answered, and informed consent                            was obtained. Prior Anticoagulants: The patient has                            taken no previous anticoagulant or antiplatelet                            agents. ASA Grade Assessment: II - A patient with                            mild systemic disease. After reviewing the risks                            and benefits, the patient was deemed in                            satisfactory condition to undergo the procedure.  After obtaining informed consent, the colonoscope                            was passed under direct vision. Throughout the                            procedure, the patient's blood pressure, pulse, and                            oxygen saturations were monitored continuously. The                            Colonoscope was introduced through the anus and                            advanced to the 2 cm  into the ileum. The                            colonoscopy was performed without difficulty. The                            patient tolerated the procedure well. The quality                            of the bowel preparation was good. The terminal                            ileum, ileocecal valve, appendiceal orifice, and                            rectum were photographed. Scope In: 11:03:34 AM Scope Out: 11:20:11 AM Scope Withdrawal Time: 0 hours 12 minutes 20 seconds  Total Procedure Duration: 0 hours 16 minutes 37 seconds  Findings:                 A few small-mouthed diverticula were found in the                            sigmoid colon and descending colon.                           Non-bleeding internal hemorrhoids were found during                            retroflexion. The hemorrhoids were small.                           The terminal ileum appeared normal.                           The exam was otherwise without abnormality on                            direct and retroflexion views. Complications:            No  immediate complications. Estimated Blood Loss:     Estimated blood loss: none. Impression:               -Mild left colonic diverticulosis.                           -Non-bleeding internal hemorrhoids.                           -Otherwise normal colonoscopy to TI. Recommendation:           - Patient has a contact number available for                            emergencies. The signs and symptoms of potential                            delayed complications were discussed with the                            patient. Return to normal activities tomorrow.                            Written discharge instructions were provided to the                            patient.                           - High fiber diet.                           - Continue present medications.                           - Repeat colonoscopy in 10 years for screening                             purposes. Earlier, if with any problems or if there                            is any change in family history.                           - Return to GI office PRN. Jackquline Denmark, MD 02/25/2019 11:25:47 AM This report has been signed electronically.

## 2019-02-25 NOTE — Progress Notes (Signed)
Temp by LC, Vitals by DT  Pt's states no medical or surgical changes since previsit or office visit.

## 2019-02-27 ENCOUNTER — Telehealth: Payer: Self-pay | Admitting: *Deleted

## 2019-02-27 NOTE — Telephone Encounter (Signed)
Follow up call made, no answer, left message 

## 2019-02-27 NOTE — Telephone Encounter (Signed)
  Follow up Call-  Call back number 02/25/2019  Post procedure Call Back phone  # [phone]  Permission to leave phone message Yes  Some recent data might be hidden     Patient questions:  Do you have a fever, pain , or abdominal swelling? No. Pain Score  0 *  Have you tolerated food without any problems? Yes.    Have you been able to return to your normal activities? Yes.    Do you have any questions about your discharge instructions: Diet   No. Medications  No. Follow up visit  No.  Do you have questions or concerns about your Care? No.  Actions: * If pain score is 4 or above: No action needed, pain <4.   1. Have you developed a fever since your procedure? no  2.   Have you had an respiratory symptoms (SOB or cough) since your procedure? no  3.   Have you tested positive for COVID 19 since your procedure no  4.   Have you had any family members/close contacts diagnosed with the COVID 19 since your procedure?  no   If yes to any of these questions please route to Joylene John, RN and Alphonsa Gin, Therapist, sports.

## 2019-03-17 DIAGNOSIS — R32 Unspecified urinary incontinence: Secondary | ICD-10-CM | POA: Insufficient documentation

## 2019-03-17 DIAGNOSIS — E559 Vitamin D deficiency, unspecified: Secondary | ICD-10-CM

## 2019-03-17 DIAGNOSIS — G3184 Mild cognitive impairment, so stated: Secondary | ICD-10-CM

## 2019-03-17 DIAGNOSIS — G629 Polyneuropathy, unspecified: Secondary | ICD-10-CM | POA: Insufficient documentation

## 2019-03-17 DIAGNOSIS — E66811 Obesity, class 1: Secondary | ICD-10-CM

## 2019-03-17 DIAGNOSIS — Z8669 Personal history of other diseases of the nervous system and sense organs: Secondary | ICD-10-CM | POA: Insufficient documentation

## 2019-03-17 DIAGNOSIS — E538 Deficiency of other specified B group vitamins: Secondary | ICD-10-CM

## 2019-03-17 DIAGNOSIS — E669 Obesity, unspecified: Secondary | ICD-10-CM | POA: Insufficient documentation

## 2019-03-17 HISTORY — DX: Obesity, unspecified: E66.9

## 2019-03-17 HISTORY — DX: Deficiency of other specified B group vitamins: E53.8

## 2019-03-17 HISTORY — DX: Polyneuropathy, unspecified: G62.9

## 2019-03-17 HISTORY — DX: Vitamin D deficiency, unspecified: E55.9

## 2019-03-17 HISTORY — DX: Unspecified urinary incontinence: R32

## 2019-03-17 HISTORY — DX: Personal history of other diseases of the nervous system and sense organs: Z86.69

## 2019-03-17 HISTORY — DX: Obesity, class 1: E66.811

## 2019-03-17 HISTORY — DX: Mild cognitive impairment of uncertain or unknown etiology: G31.84

## 2019-03-18 ENCOUNTER — Telehealth: Payer: Self-pay | Admitting: Gastroenterology

## 2019-03-18 NOTE — Telephone Encounter (Signed)
Patient calling- she states that her doctor who refills Linzess for her does not do the prior authorization right and she is always late taking it because she never gets the medication on time. She is asking if Dr. Lyndel Safe would prescribe her this medication so she can go through him to get it for now on.

## 2019-03-19 MED ORDER — LINZESS 72 MCG PO CAPS: 72.0000 ug | ORAL_CAPSULE | Freq: Every day | ORAL | 11 refills | Status: DC

## 2019-03-19 NOTE — Telephone Encounter (Signed)
Please advise 

## 2019-03-19 NOTE — Telephone Encounter (Signed)
I have sent prescription to patients pharmacy. Patient has been notified.

## 2019-03-19 NOTE — Telephone Encounter (Signed)
Please do  RG 

## 2019-06-25 ENCOUNTER — Telehealth: Payer: Self-pay | Admitting: Gastroenterology

## 2019-06-25 NOTE — Telephone Encounter (Signed)
pT needs rf for Linzess. She states that her pharmacy does not want to rf it and told her that we need to call them. [facility] [place].

## 2019-09-18 ENCOUNTER — Ambulatory Visit: Payer: Medicare Other | Admitting: Cardiology

## 2019-09-22 ENCOUNTER — Other Ambulatory Visit: Payer: Self-pay

## 2019-09-22 DIAGNOSIS — N189 Chronic kidney disease, unspecified: Secondary | ICD-10-CM | POA: Insufficient documentation

## 2019-09-22 DIAGNOSIS — R251 Tremor, unspecified: Secondary | ICD-10-CM | POA: Insufficient documentation

## 2019-09-22 DIAGNOSIS — G4730 Sleep apnea, unspecified: Secondary | ICD-10-CM | POA: Insufficient documentation

## 2019-09-22 DIAGNOSIS — M1990 Unspecified osteoarthritis, unspecified site: Secondary | ICD-10-CM | POA: Insufficient documentation

## 2019-09-22 DIAGNOSIS — R569 Unspecified convulsions: Secondary | ICD-10-CM | POA: Insufficient documentation

## 2019-09-22 DIAGNOSIS — K589 Irritable bowel syndrome without diarrhea: Secondary | ICD-10-CM | POA: Insufficient documentation

## 2019-09-22 DIAGNOSIS — E7800 Pure hypercholesterolemia, unspecified: Secondary | ICD-10-CM | POA: Insufficient documentation

## 2019-09-23 ENCOUNTER — Ambulatory Visit (INDEPENDENT_AMBULATORY_CARE_PROVIDER_SITE_OTHER): Payer: Medicare Other | Admitting: Cardiology

## 2019-09-23 ENCOUNTER — Ambulatory Visit (INDEPENDENT_AMBULATORY_CARE_PROVIDER_SITE_OTHER): Payer: Medicare Other

## 2019-09-23 ENCOUNTER — Other Ambulatory Visit: Payer: Self-pay

## 2019-09-23 ENCOUNTER — Encounter: Payer: Self-pay | Admitting: Cardiology

## 2019-09-23 VITALS — BP 112/80 | HR 103 | Ht 65.0 in | Wt 193.0 lb

## 2019-09-23 DIAGNOSIS — R002 Palpitations: Secondary | ICD-10-CM

## 2019-09-23 DIAGNOSIS — I951 Orthostatic hypotension: Secondary | ICD-10-CM

## 2019-09-23 DIAGNOSIS — E782 Mixed hyperlipidemia: Secondary | ICD-10-CM | POA: Diagnosis not present

## 2019-09-23 DIAGNOSIS — R011 Cardiac murmur, unspecified: Secondary | ICD-10-CM

## 2019-09-23 HISTORY — DX: Cardiac murmur, unspecified: R01.1

## 2019-09-23 HISTORY — DX: Palpitations: R00.2

## 2019-09-23 HISTORY — DX: Orthostatic hypotension: I95.1

## 2019-09-23 MED ORDER — METOPROLOL SUCCINATE ER 25 MG PO TB24: 25.0000 mg | ORAL_TABLET | Freq: Every day | ORAL | 6 refills | Status: DC

## 2019-09-23 NOTE — Patient Instructions (Addendum)
Medication Instructions:  Your physician has recommended you make the following change in your medication:   Start Metoprolol Succinate 25 mg daily after you remove the monitor.  *If you need a refill on your cardiac medications before your next appointment, please call your pharmacy*   Lab Work: Your physician recommends that you have a TSH done today in the office.  If you have labs (blood work) drawn today and your tests are completely normal, you will receive your results only by: Marland Kitchen MyChart Message (if you have MyChart) OR . A paper copy in the mail If you have any lab test that is abnormal or we need to change your treatment, we will call you to review the results.   Testing/Procedures: Your physician has requested that you have an echocardiogram. Echocardiography is a painless test that uses sound waves to create images of your heart. It provides your doctor with information about the size and shape of your heart and how well your heart's chambers and valves are working. This procedure takes approximately one hour. There are no restrictions for this procedure.   WHY IS MY DOCTOR PRESCRIBING ZIO? The Zio system is proven and trusted by physicians to detect and diagnose irregular heart rhythms -- and has been prescribed to hundreds of thousands of patients.  The FDA has cleared the Zio system to monitor for many different kinds of irregular heart rhythms. In a study, physicians were able to reach a diagnosis 90% of the time with the Zio system1.  You can wear the Zio monitor -- a small, discreet, comfortable patch -- during your normal day-to-day activity, including while you sleep, shower, and exercise, while it records every single heartbeat for analysis.  1Barrett, P., et al. Comparison of 24 Hour Holter Monitoring Versus 14 Day Novel Adhesive Patch Electrocardiographic Monitoring. [place], 2014.  ZIO VS. HOLTER MONITORING The Zio monitor can be comfortably  worn for up to 14 days. Holter monitors can be worn for 24 to 48 hours, limiting the time to record any irregular heart rhythms you may have. Zio is able to capture data for the 51% of patients who have their first symptom-triggered arrhythmia after 48 hours.1  LIVE WITHOUT RESTRICTIONS The Zio ambulatory cardiac monitor is a small, unobtrusive, and water-resistant patch--you might even forget you're wearing it. The Zio monitor records and stores every beat of your heart, whether you're sleeping, working out, or showering.  Wear the monitor for 3 days, remove 09/26/19.   Follow-Up: At [facility], you and your health needs are our priority.  As part of our continuing mission to provide you with exceptional heart care, we have created designated Provider Care Teams.  These Care Teams include your primary Cardiologist (physician) and Advanced Practice Providers (APPs -  Physician Assistants and Nurse Practitioners) who all work together to provide you with the care you need, when you need it.  We recommend signing up for the patient portal called "MyChart".  Sign up information is provided on this After Visit Summary.  MyChart is used to connect with patients for Virtual Visits (Telemedicine).  Patients are able to view lab/test results, encounter notes, upcoming appointments, etc.  Non-urgent messages can be sent to your provider as well.   To learn more about what you can do with MyChart, go to [email or web address].    Your next appointment:   1 month(s)  The format for your next appointment:   In Person  Provider:   Jyl Heinz, MD  Other Instructions  Echocardiogram An echocardiogram is a procedure that uses painless sound waves (ultrasound) to produce an image of the heart. Images from an echocardiogram can provide important information about:  Signs of coronary artery disease (CAD).  Aneurysm detection. An aneurysm is a weak or damaged part of an artery wall that bulges  out from the normal force of blood pumping through the body.  Heart size and shape. Changes in the size or shape of the heart can be associated with certain conditions, including heart failure, aneurysm, and CAD.  Heart muscle function.  Heart valve function.  Signs of a past heart attack.  Fluid buildup around the heart.  Thickening of the heart muscle.  A tumor or infectious growth around the heart valves. Tell a health care provider about:  Any allergies you have.  All medicines you are taking, including vitamins, herbs, eye drops, creams, and over-the-counter medicines.  Any blood disorders you have.  Any surgeries you have had.  Any medical conditions you have.  Whether you are pregnant or may be pregnant. What are the risks? Generally, this is a safe procedure. However, problems may occur, including:  Allergic reaction to dye (contrast) that may be used during the procedure. What happens before the procedure? No specific preparation is needed. You may eat and drink normally. What happens during the procedure?   An IV tube may be inserted into one of your veins.  You may receive contrast through this tube. A contrast is an injection that improves the quality of the pictures from your heart.  A gel will be applied to your chest.  A wand-like tool (transducer) will be moved over your chest. The gel will help to transmit the sound waves from the transducer.  The sound waves will harmlessly bounce off of your heart to allow the heart images to be captured in real-time motion. The images will be recorded on a computer. The procedure may vary among health care providers and hospitals. What happens after the procedure?  You may return to your normal, everyday life, including diet, activities, and medicines, unless your health care provider tells you not to do that. Summary  An echocardiogram is a procedure that uses painless sound waves (ultrasound) to produce an  image of the heart.  Images from an echocardiogram can provide important information about the size and shape of your heart, heart muscle function, heart valve function, and fluid buildup around your heart.  You do not need to do anything to prepare before this procedure. You may eat and drink normally.  After the echocardiogram is completed, you may return to your normal, everyday life, unless your health care provider tells you not to do that. This information is not intended to replace advice given to you by your health care provider. Make sure you discuss any questions you have with your health care provider. Document Revised: 04/11/2018 Document Reviewed: 01/22/2016 Elsevier Patient Education  [place].   Wear compression stockings.

## 2019-09-23 NOTE — Progress Notes (Signed)
Cardiology Office Note:    Date:  09/23/2019   ID:  Brandy Maxwell, DOB 08-11-1955, MRN [id number]  PCP:  Enid Skeens., MD  Cardiologist:  Jenean Lindau, MD   Referring MD: Enid Skeens., MD    ASSESSMENT:    1. Mixed hyperlipidemia   2. Palpitations   3. Postural hypotension   4. Cardiac murmur    PLAN:    In order of problems listed above:  1. Primary prevention stressed with the patient.  Importance of compliance with diet medication stressed any vocalized understanding. 2. Postural hypotension: Salt intake issues were discussed I told her to increase salt and water in the diet and she promises to do so.  We will also do a TSH today.  Compression stocking use was advised. 3. Palpitations: I discussed this with the patient at length.  We will do a TSH.  I will do a 3-day monitoring.  After the monitoring, we will initiate her on Toprol-XL 25 mg daily. 4. Cardiac murmur: Echocardiogram will be done to assess murmur heard on auscultation. 5. Fall precautions were advised with the patient and she vocalized understanding.Patient will be seen in follow-up appointment in 1 month or earlier if the patient has any concerns    Medication Adjustments/Labs and Tests Ordered: Current medicines are reviewed at length with the patient today.  Concerns regarding medicines are outlined above.  No orders of the defined types were placed in this encounter.  No orders of the defined types were placed in this encounter.    History of Present Illness:    Brandy Maxwell is a 64 y.o. female who is being seen today for the evaluation of postural hypotension-like symptoms and palpitations at the request of [place], Marshall Cork., MD.  Patient is a pleasant 64 year old female she has some spinal issues and therefore has been on disability.  She ambulates with a cane.  She mentions to me that she feels palpitations on and off.  No orthopnea or PND.  It is not clear whether she has had a syncope.   Her son accompanies her for this visit.  No chest pain.  At the time of my evaluation, the patient is alert awake oriented and in no distress.  Past Medical History:  Diagnosis Date  . Abnormal mammogram of left breast 11/05/2018  . Allergic rhinitis 08/30/2013  . Arthritis   . Atypical ductal hyperplasia of right breast 05/21/2017  . Chronic kidney disease    recurrent kidney infections  . Coccydynia 03/24/2015  . 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 Depression with anxiety 08/30/2013  . Difficulty in walking 08/30/2013  . Disorder of sacrum 02/12/2013  . Epilepsy ([place]) 08/30/2013  . Excessive daytime sleepiness 07/03/2017  . Family history of first degree relative with dementia 10/26/2017  . Fibromyalgia   . Gait abnormality 07/10/2012  . Herpes zoster with nervous system complications 9/93/5701  . History of seizure disorder 03/17/2019  . Hypercholesteremia   . Hyperlipidemia 08/30/2013  . IBS (irritable bowel syndrome)   . Insomnia 09/07/2015  . Mass of upper outer quadrant of left breast 05/21/2017  . Medication management 01/31/2019  . Migraine headache 08/30/2013  . Mild cognitive impairment 03/17/2019  . Myalgia and myositis 02/12/2013  . Neuropathy 03/17/2019  . Obesity, Class I, BMI 30-34.9 03/17/2019  . Obstructive sleep apnea (adult) (pediatric) 08/30/2013  . Pain in thoracic spine 02/12/2013  . 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  . Postlaminectomy syndrome, lumbar region 02/12/2013  .  Postoperative examination 06/19/2017  . Restless legs syndrome 05/22/2017  . Risk for falls 09/07/2015  . Seizures ([place])   . Sleep apnea    nightly cpap  . Spondylosis of thoracic region without myelopathy or radiculopathy 02/12/2013  . Thoracic or lumbosacral neuritis or radiculitis 02/12/2013  . Thoracic spondylosis with myelopathy 02/12/2013  . Tremors of nervous system   . Urinary incontinence 03/17/2019  . Urinary incontinence, mixed 05/29/2013  . Vitamin B  12 deficiency 03/17/2019  . Vitamin D deficiency 03/17/2019  . Weakness 07/10/2012    Past Surgical History:  Procedure Laterality Date  . BLADDER REPAIR  03/13/2018  . BLADDER SUSPENSION  03/13/2018  . BREAST LUMPECTOMY  2019  . COLONOSCOPY  12/20/2012   Left colonic diverticulosis predominantly in the sigmoid colon. Small internal hemmorhoids. Otherwise normal colonoscopy.   Marland Kitchen LAMINECTOMY  1998    Current Medications: Current Meds  Medication Sig  . cefdinir (OMNICEF) 300 MG capsule Take 300 mg by mouth 2 (two) times daily.  . chlorhexidine (PERIDEX) 0.12 % solution Use as directed in the mouth or throat daily.   . ciprofloxacin (CIPRO) 250 MG tablet Take by mouth.  . [employer] VITAMIN B12 1000 MCG tablet Take 1,000 mcg by mouth daily.  Marland Kitchen dicyclomine (BENTYL) 10 MG capsule Take 10 mg by mouth 3 (three) times daily as needed.  . DULoxetine (CYMBALTA) 60 MG capsule Take by mouth.  . EPINEPHrine 0.3 mg/0.3 mL IJ SOAJ injection PLEASE SEE ATTACHED FOR DETAILED DIRECTIONS  . fenofibrate 160 MG tablet Take 160 mg by mouth daily.  . fluconazole (DIFLUCAN) 150 MG tablet Take by mouth once as needed.  . gabapentin (NEURONTIN) 600 MG tablet Take 1,200 mg by mouth 3 (three) times daily.  Marland Kitchen lidocaine (LIDODERM) 5 % Place 1 patch onto the skin as needed.   Marland Kitchen LINZESS 72 MCG capsule Take 1 capsule (72 mcg total) by mouth daily.  . modafinil (PROVIGIL) 200 MG tablet Take 200 mg by mouth 2 (two) times daily.  Marland Kitchen MYRBETRIQ 50 MG TB24 tablet Take 50 mg by mouth daily.  . pramipexole (MIRAPEX) 0.25 MG tablet Take 2 tablets by mouth at bedtime.  . traMADol (ULTRAM) 50 MG tablet Take 50 mg by mouth 2 (two) times daily as needed.  . Vitamin D, Ergocalciferol, (DRISDOL) 1.25 MG (50000 UT) CAPS capsule TAKE 1 CAPSULE BY MOUTH ONCE A WEEK FOR 30 DAYS   Current Facility-Administered Medications for the 09/23/19 encounter (Office Visit) with Jair Lindblad, Reita Cliche, MD  Medication  . 0.9 %  sodium chloride infusion      Allergies:   Nitrofurantoin and Sulfa antibiotics   Social History   Socioeconomic History  . Marital status: Married    Spouse name: Not on file  . Number of children: Not on file  . Years of education: Not on file  . Highest education level: Not on file  Occupational History  . Not on file  Tobacco Use  . Smoking status: Never Smoker  . Smokeless tobacco: Never Used  Vaping Use  . Vaping Use: Never used  Substance and Sexual Activity  . Alcohol use: Never  . Drug use: Never  . Sexual activity: Not on file  Other Topics Concern  . Not on file  Social History Narrative  . Not on file   Social Determinants of Health   Financial Resource Strain:   . Difficulty of Paying Living Expenses: Not on file  Food Insecurity:   . Worried About [employer] of  Food in the Last Year: Not on file  . Ran Out of Food in the Last Year: Not on file  Transportation Needs:   . Lack of Transportation (Medical): Not on file  . Lack of Transportation (Non-Medical): Not on file  Physical Activity:   . Days of Exercise per Week: Not on file  . Minutes of Exercise per Session: Not on file  Stress:   . Feeling of Stress : Not on file  Social Connections:   . Frequency of Communication with Friends and Family: Not on file  . Frequency of Social Gatherings with Friends and Family: Not on file  . Attends Religious Services: Not on file  . Active Member of Clubs or Organizations: Not on file  . Attends Archivist Meetings: Not on file  . Marital Status: Not on file     Family History: The patient's family history includes Allergic rhinitis in her sister; Alzheimer's disease in her father and mother; Clotting disorder in her father; Colon cancer in her maternal grandfather; Heart Problems in her father; Hypercholesterolemia in her mother; Uterine cancer in her maternal aunt. There is no history of Esophageal cancer, Stomach cancer, or Rectal cancer.  ROS:   Please see the history of  present illness.    All other systems reviewed and are negative.  EKGs/Labs/Other Studies Reviewed:    The following studies were reviewed today: EKG reveals sinus rhythm and tachycardia nonspecific ST-T changes   Recent Labs: No results found for requested labs within last 8760 hours.  Recent Lipid Panel No results found for: CHOL, TRIG, HDL, CHOLHDL, VLDL, LDLCALC, LDLDIRECT  Physical Exam:    VS:  BP 112/80   Pulse (!) 103   Ht 5\' 5"  (1.651 m)   Wt 193 lb (87.5 kg)   SpO2 97%   BMI 32.12 kg/m     Wt Readings from Last 3 Encounters:  09/23/19 193 lb (87.5 kg)  02/25/19 195 lb (88.5 kg)  02/12/19 195 lb (88.5 kg)     GEN: Patient is in no acute distress HEENT: Normal NECK: No JVD; No carotid bruits LYMPHATICS: No lymphadenopathy CARDIAC: S1 S2 regular, 2/6 systolic murmur at the apex. RESPIRATORY:  Clear to auscultation without rales, wheezing or rhonchi  ABDOMEN: Soft, non-tender, non-distended MUSCULOSKELETAL:  No edema; No deformity  SKIN: Warm and dry NEUROLOGIC:  Alert and oriented x 3 PSYCHIATRIC:  Normal affect    Signed, Jenean Lindau, MD  09/23/2019 4:24 PM    [facility]

## 2019-09-24 LAB — TSH: TSH: 3.67 u[IU]/mL (ref 0.450–4.500)

## 2019-10-06 NOTE — Progress Notes (Unsigned)
Results reviewed in MyChart Routed to PCP

## 2019-10-14 ENCOUNTER — Ambulatory Visit (INDEPENDENT_AMBULATORY_CARE_PROVIDER_SITE_OTHER): Payer: Medicare Other

## 2019-10-14 ENCOUNTER — Other Ambulatory Visit: Payer: Self-pay

## 2019-10-14 DIAGNOSIS — R011 Cardiac murmur, unspecified: Secondary | ICD-10-CM | POA: Diagnosis not present

## 2019-10-14 LAB — ECHOCARDIOGRAM COMPLETE: S' Lateral: 2.5 cm

## 2019-10-14 NOTE — Progress Notes (Signed)
Complete echocardiogram ha been performed.  Jimmy Ardene Remley RDCS, RVT

## 2019-10-16 ENCOUNTER — Telehealth: Payer: Self-pay

## 2019-10-16 NOTE — Telephone Encounter (Signed)
Pt is returning call.  

## 2019-10-16 NOTE — Telephone Encounter (Signed)
-----   Message from Jenean Lindau, MD sent at 10/15/2019  9:25 AM EDT ----- The results of the study is unremarkable. Please inform patient. I will discuss in detail at next appointment. Cc  primary care/referring physician Jenean Lindau, MD 10/15/2019 9:25 AM

## 2019-10-16 NOTE — Telephone Encounter (Signed)
Left message on patients voicemail to please return our call.   

## 2019-10-17 NOTE — Telephone Encounter (Signed)
Results reviewed with pt as per Dr. Revankar's note.  Pt verbalized understanding and had no additional questions. Routed to PCP.  

## 2019-10-29 ENCOUNTER — Other Ambulatory Visit: Payer: Self-pay

## 2019-10-31 ENCOUNTER — Telehealth (INDEPENDENT_AMBULATORY_CARE_PROVIDER_SITE_OTHER): Payer: Medicare Other | Admitting: Cardiology

## 2019-10-31 ENCOUNTER — Encounter: Payer: Self-pay | Admitting: Cardiology

## 2019-10-31 ENCOUNTER — Telehealth: Payer: Self-pay

## 2019-10-31 VITALS — BP 119/99 | HR 96 | Ht 65.0 in | Wt 190.0 lb

## 2019-10-31 DIAGNOSIS — E782 Mixed hyperlipidemia: Secondary | ICD-10-CM | POA: Diagnosis not present

## 2019-10-31 DIAGNOSIS — E119 Type 2 diabetes mellitus without complications: Secondary | ICD-10-CM

## 2019-10-31 DIAGNOSIS — I951 Orthostatic hypotension: Secondary | ICD-10-CM

## 2019-10-31 DIAGNOSIS — E669 Obesity, unspecified: Secondary | ICD-10-CM | POA: Diagnosis not present

## 2019-10-31 HISTORY — DX: Type 2 diabetes mellitus without complications: E11.9

## 2019-10-31 NOTE — Telephone Encounter (Signed)
  Patient Consent for Virtual Visit         Brandy Maxwell has provided verbal consent on 10/31/2019 for a virtual visit (video or telephone).   CONSENT FOR VIRTUAL VISIT FOR:  Brandy Maxwell  By participating in this virtual visit I agree to the following:  I hereby voluntarily request, consent and authorize [facility] and its employed or contracted physicians, physician assistants, nurse practitioners or other licensed health care professionals (the Practitioner), to provide me with telemedicine health care services (the "Services") as deemed necessary by the treating Practitioner. I acknowledge and consent to receive the Services by the Practitioner via telemedicine. I understand that the telemedicine visit will involve communicating with the Practitioner through live audiovisual communication technology and the disclosure of certain medical information by electronic transmission. I acknowledge that I have been given the opportunity to request an in-person assessment or other available alternative prior to the telemedicine visit and am voluntarily participating in the telemedicine visit.  I understand that I have the right to withhold or withdraw my consent to the use of telemedicine in the course of my care at any time, without affecting my right to future care or treatment, and that the Practitioner or I may terminate the telemedicine visit at any time. I understand that I have the right to inspect all information obtained and/or recorded in the course of the telemedicine visit and may receive copies of available information for a reasonable fee.  I understand that some of the potential risks of receiving the Services via telemedicine include:  Marland Kitchen Delay or interruption in medical evaluation due to technological equipment failure or disruption; . Information transmitted may not be sufficient (e.g. poor resolution of images) to allow for appropriate medical decision making by the Practitioner;  and/or  . In rare instances, security protocols could fail, causing a breach of personal health information.  Furthermore, I acknowledge that it is my responsibility to provide information about my medical history, conditions and care that is complete and accurate to the best of my ability. I acknowledge that Practitioner's advice, recommendations, and/or decision may be based on factors not within their control, such as incomplete or inaccurate data provided by me or distortions of diagnostic images or specimens that may result from electronic transmissions. I understand that the practice of medicine is not an exact science and that Practitioner makes no warranties or guarantees regarding treatment outcomes. I acknowledge that a copy of this consent can be made available to me via my patient portal ([facility]), or I can request a printed copy by calling the office of [facility].    I understand that my insurance will be billed for this visit.   I have read or had this consent read to me. . I understand the contents of this consent, which adequately explains the benefits and risks of the Services being provided via telemedicine.  . I have been provided ample opportunity to ask questions regarding this consent and the Services and have had my questions answered to my satisfaction. . I give my informed consent for the services to be provided through the use of telemedicine in my medical care

## 2019-10-31 NOTE — Progress Notes (Signed)
Virtual Visit via Telephone Note   This visit type was conducted due to national recommendations for restrictions regarding the COVID-19 Pandemic (e.g. social distancing) in an effort to limit this patient's exposure and mitigate transmission in our community.  Due to her co-morbid illnesses, this patient is at least at moderate risk for complications without adequate follow up.  This format is felt to be most appropriate for this patient at this time.  The patient did not have access to video technology/had technical difficulties with video requiring transitioning to audio format only (telephone).  All issues noted in this document were discussed and addressed.  No physical exam could be performed with this format.  Please refer to the patient's chart for her  consent to telehealth for [facility].    Date:  10/31/2019   ID:  Brandy Maxwell, DOB Feb 13, 1955, MRN [id number] The patient was identified using 2 identifiers.  Patient Location: Home Provider Location: Office/Clinic  PCP:  Enid Skeens., MD  Cardiologist:  No primary care provider on file.  Electrophysiologist:  None   Evaluation Performed:  Follow-Up Visit  Chief Complaint: Postural hypotension  History of Present Illness:    Brandy Maxwell is a 64 y.o. female with history of questionable hypertension.  She was referred here for the same reason.  Subsequently she tells me that she is feeling much better.  The last time I saw her she had urinary tract infection which has gotten better.  At the time of my evaluation, the patient is alert awake oriented and in no distress.  The patient does not have symptoms concerning for COVID-19 infection (fever, chills, cough, or new shortness of breath).    Past Medical History:  Diagnosis Date  . Abnormal mammogram of left breast 11/05/2018  . Allergic rhinitis 08/30/2013  . Arthritis   . Atypical ductal hyperplasia of right breast 05/21/2017  . Cardiac murmur 09/23/2019  .  Chronic kidney disease    recurrent kidney infections  . Coccydynia 03/24/2015  . 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 Depression with anxiety 08/30/2013  . Difficulty in walking 08/30/2013  . Disorder of sacrum 02/12/2013  . Epilepsy ([place]) 08/30/2013  . Excessive daytime sleepiness 07/03/2017  . Family history of first degree relative with dementia 10/26/2017  . Fibromyalgia   . Gait abnormality 07/10/2012  . Herpes zoster with nervous system complications 6/65/9935  . History of seizure disorder 03/17/2019  . Hypercholesteremia   . Hyperlipidemia 08/30/2013  . IBS (irritable bowel syndrome)   . Insomnia 09/07/2015  . Mass of upper outer quadrant of left breast 05/21/2017  . Medication management 01/31/2019  . Migraine headache 08/30/2013  . Mild cognitive impairment 03/17/2019  . Myalgia and myositis 02/12/2013  . Neuropathy 03/17/2019  . Obesity, Class I, BMI 30-34.9 03/17/2019  . Obstructive sleep apnea (adult) (pediatric) 08/30/2013  . Pain in thoracic spine 02/12/2013  . Palpitations 09/23/2019  . 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  . Postlaminectomy syndrome, lumbar region 02/12/2013  . Postoperative examination 06/19/2017  . Postural hypotension 09/23/2019  . Restless legs syndrome 05/22/2017  . Risk for falls 09/07/2015  . Seizures ([place])   . Sleep apnea    nightly cpap  . Spondylosis of thoracic region without myelopathy or radiculopathy 02/12/2013  . Thoracic or lumbosacral neuritis or radiculitis 02/12/2013  . Thoracic spondylosis with myelopathy 02/12/2013  . Tremors of nervous system   . Urinary incontinence 03/17/2019  . Urinary incontinence, mixed 05/29/2013  . Vitamin B 12  deficiency 03/17/2019  . Vitamin D deficiency 03/17/2019  . Weakness 07/10/2012   Past Surgical History:  Procedure Laterality Date  . BLADDER REPAIR  03/13/2018  . BLADDER SUSPENSION  03/13/2018  . BREAST LUMPECTOMY  2019  . COLONOSCOPY  12/20/2012   Left colonic  diverticulosis predominantly in the sigmoid colon. Small internal hemmorhoids. Otherwise normal colonoscopy.   Marland Kitchen LAMINECTOMY  1998     Current Meds  Medication Sig  . cefdinir (OMNICEF) 300 MG capsule Take 300 mg by mouth 2 (two) times daily.  . chlorhexidine (PERIDEX) 0.12 % solution Use as directed in the mouth or throat daily.   Marland Kitchen dicyclomine (BENTYL) 10 MG capsule Take 10 mg by mouth 3 (three) times daily as needed.  . doxycycline (VIBRA-TABS) 100 MG tablet Take 100 mg by mouth 2 (two) times daily.  . DULoxetine (CYMBALTA) 60 MG capsule Take by mouth.  . EPINEPHrine 0.3 mg/0.3 mL IJ SOAJ injection PLEASE SEE ATTACHED FOR DETAILED DIRECTIONS  . ezetimibe (ZETIA) 10 MG tablet Take 10 mg by mouth daily.  . fenofibrate 160 MG tablet Take 160 mg by mouth daily.  . fluconazole (DIFLUCAN) 150 MG tablet Take by mouth once as needed.  . gabapentin (NEURONTIN) 600 MG tablet Take 1,200 mg by mouth 3 (three) times daily.  Marland Kitchen lidocaine (LIDODERM) 5 % Place 1 patch onto the skin as needed.   Marland Kitchen LINZESS 72 MCG capsule Take 1 capsule (72 mcg total) by mouth daily.  . metoprolol succinate (TOPROL XL) 25 MG 24 hr tablet Take 1 tablet (25 mg total) by mouth daily.  . metroNIDAZOLE (METROCREAM) 0.75 % cream Apply topically.  Marland Kitchen MYRBETRIQ 50 MG TB24 tablet Take 50 mg by mouth daily.  . pramipexole (MIRAPEX) 0.25 MG tablet Take 2 tablets by mouth at bedtime.  . traMADol (ULTRAM) 50 MG tablet Take 50 mg by mouth 2 (two) times daily as needed.  . Vitamin D, Ergocalciferol, (DRISDOL) 1.25 MG (50000 UT) CAPS capsule TAKE 1 CAPSULE BY MOUTH ONCE A WEEK FOR 30 DAYS   Current Facility-Administered Medications for the 10/31/19 encounter (Video Visit) with Daivon Rayos, Reita Cliche, MD  Medication  . 0.9 %  sodium chloride infusion     Allergies:   Nitrofurantoin and Sulfa antibiotics   Social History   Tobacco Use  . Smoking status: Never Smoker  . Smokeless tobacco: Never Used  Vaping Use  . Vaping Use: Never used   Substance Use Topics  . Alcohol use: Never  . Drug use: Never     Family Hx: The patient's family history includes Allergic rhinitis in her sister; Alzheimer's disease in her father and mother; Clotting disorder in her father; Colon cancer in her maternal grandfather; Heart Problems in her father; Hypercholesterolemia in her mother; Uterine cancer in her maternal aunt. There is no history of Esophageal cancer, Stomach cancer, or Rectal cancer.  ROS:   Please see the history of present illness.    Review of systems was negative All other systems reviewed and are negative.   Prior CV studies:   The following studies were reviewed today:  IMPRESSIONS    1. Left ventricular ejection fraction, by estimation, is 60 to 65%. The  left ventricle has normal function. The left ventricle has no regional  wall motion abnormalities. There is mild concentric left ventricular  hypertrophy. Left ventricular diastolic  parameters are consistent with Grade I diastolic dysfunction (impaired  relaxation).  2. Right ventricular systolic function is normal. The right ventricular  size is  normal. There is normal pulmonary artery systolic pressure.  3. The mitral valve is normal in structure. Trivial mitral valve  regurgitation. No evidence of mitral stenosis.  4. The aortic valve is normal in structure. Aortic valve regurgitation is  not visualized. No aortic stenosis is present.  5. The inferior vena cava is normal in size with greater than 50%  respiratory variability, suggesting right atrial pressure of 3 mmHg.   EVENT MONITOR REPORT:   Patient was monitored from 09/23/2019 to 09/26/2019. Indication:                    Palpitations Ordering physician:  Jenean Lindau, MD  Referring physician:        Jenean Lindau, MD    Baseline rhythm: Sinus  Minimum heart rate: 63 BPM.  Average heart rate: 83 BPM.  Maximal heart rate 135 BPM.  Atrial arrhythmia: None significant rare  PACs  Ventricular arrhythmia: Rare PVCs  Conduction abnormality: None  Symptoms: None significant   Conclusion:  Unremarkable event monitor.  Interpreting  cardiologist: Jenean Lindau, MD  Date: 10/03/2019 10:50 AM   Labs/Other Tests and Data Reviewed:    EKG:  EKG from prior visit reviewed.  Recent Labs: 09/23/2019: TSH 3.670   Recent Lipid Panel No results found for: CHOL, TRIG, HDL, CHOLHDL, LDLCALC, LDLDIRECT  Wt Readings from Last 3 Encounters:  10/31/19 190 lb (86.2 kg)  09/23/19 193 lb (87.5 kg)  02/25/19 195 lb (88.5 kg)     Risk Assessment/Calculations:      Objective:    Vital Signs:  BP (!) 119/99   Pulse 96   Ht 5\' 5"  (1.651 m)   Wt 190 lb (86.2 kg)   BMI 31.62 kg/m    VITAL SIGNS:  reviewed  ASSESSMENT & PLAN:    1. Postural hypotension this is resolved.  Patient has taken appropriate measures and maneuvers to take care of herself and feels much better. 2. Mixed dyslipidemia: Managed by primary care physician and diet was emphasized 3. Diet-controlled diabetes mellitus: She mentions to me that she saw her primary care physician and that her diet was emphasized.  And she is taking to care with diet to reduce weight and get her diabetes under control. 4. Patient had multiple questions which were answered to her satisfaction.  COVID-19 Education: The signs and symptoms of COVID-19 were discussed with the patient and how to seek care for testing (follow up with PCP or arrange E-visit).  The importance of social distancing was discussed today.  Time:   Today, I have spent 15 minutes with the patient with telehealth technology discussing the above problems.     Medication Adjustments/Labs and Tests Ordered: Current medicines are reviewed at length with the patient today.  Concerns regarding medicines are outlined above.   Tests Ordered: No orders of the defined types were placed in this encounter.   Medication Changes: No orders of the  defined types were placed in this encounter.   Follow Up:  In Person in 4 month(s)  Signed, Jenean Lindau, MD  10/31/2019 2:40 PM    [place]

## 2020-03-15 ENCOUNTER — Other Ambulatory Visit: Payer: Self-pay | Admitting: Cardiology

## 2020-03-15 NOTE — Telephone Encounter (Signed)
Metoprolol approved and sent 

## 2020-04-11 ENCOUNTER — Other Ambulatory Visit: Payer: Self-pay | Admitting: Gastroenterology

## 2020-05-21 DIAGNOSIS — N319 Neuromuscular dysfunction of bladder, unspecified: Secondary | ICD-10-CM

## 2020-05-21 HISTORY — DX: Neuromuscular dysfunction of bladder, unspecified: N31.9

## 2020-07-18 ENCOUNTER — Other Ambulatory Visit: Payer: Self-pay | Admitting: Gastroenterology

## 2020-07-22 DIAGNOSIS — M792 Neuralgia and neuritis, unspecified: Secondary | ICD-10-CM

## 2020-07-22 HISTORY — DX: Neuralgia and neuritis, unspecified: M79.2

## 2020-07-23 ENCOUNTER — Other Ambulatory Visit: Payer: Self-pay | Admitting: Gastroenterology

## 2020-07-28 ENCOUNTER — Telehealth: Payer: Self-pay | Admitting: Gastroenterology

## 2020-07-28 MED ORDER — LINZESS 72 MCG PO CAPS: 72.0000 ug | ORAL_CAPSULE | Freq: Every day | ORAL | 2 refills | Status: DC

## 2020-07-28 NOTE — Telephone Encounter (Signed)
Inbound call from patient requesting med refill for linzess. Have appt schedule 9/2

## 2020-07-28 NOTE — Telephone Encounter (Signed)
Done

## 2020-09-03 ENCOUNTER — Ambulatory Visit: Payer: Medicare Other | Admitting: Gastroenterology

## 2020-09-29 ENCOUNTER — Other Ambulatory Visit: Payer: Self-pay | Admitting: Cardiology

## 2020-09-29 DIAGNOSIS — R404 Transient alteration of awareness: Secondary | ICD-10-CM

## 2020-09-29 HISTORY — DX: Transient alteration of awareness: R40.4

## 2020-10-11 ENCOUNTER — Other Ambulatory Visit: Payer: Self-pay | Admitting: Vascular Surgery

## 2020-10-12 ENCOUNTER — Other Ambulatory Visit: Payer: Self-pay | Admitting: Vascular Surgery

## 2020-10-12 DIAGNOSIS — N6489 Other specified disorders of breast: Secondary | ICD-10-CM

## 2020-10-13 ENCOUNTER — Ambulatory Visit: Payer: Medicare Other | Admitting: Gastroenterology

## 2020-10-20 ENCOUNTER — Ambulatory Visit
Admission: RE | Admit: 2020-10-20 | Discharge: 2020-10-20 | Disposition: A | Discharge status: Home / Self Care | Payer: Medicare Other | Source: Ambulatory Visit | Attending: Vascular Surgery | Admitting: Vascular Surgery

## 2020-10-20 ENCOUNTER — Other Ambulatory Visit: Payer: Self-pay

## 2020-10-20 DIAGNOSIS — N6489 Other specified disorders of breast: Secondary | ICD-10-CM

## 2020-10-23 ENCOUNTER — Other Ambulatory Visit: Payer: Self-pay | Admitting: Gastroenterology

## 2020-10-26 ENCOUNTER — Telehealth: Payer: Self-pay

## 2020-10-26 DIAGNOSIS — Z170 Estrogen receptor positive status [ER+]: Secondary | ICD-10-CM

## 2020-10-26 DIAGNOSIS — C50112 Malignant neoplasm of central portion of left female breast: Secondary | ICD-10-CM | POA: Insufficient documentation

## 2020-10-26 HISTORY — DX: Estrogen receptor positive status (ER+): Z17.0

## 2020-10-26 HISTORY — DX: Malignant neoplasm of central portion of left female breast: C50.112

## 2020-10-26 NOTE — Telephone Encounter (Signed)
   Name: Brandy Maxwell  DOB: [birth year]  MRN: [id number]  Primary Cardiologist: Jenean Lindau, MD  Chart reviewed as part of pre-operative protocol coverage. Because of [name] past medical history and time since last visit, she will require a follow-up visit in order to better assess preoperative cardiovascular risk. Last OV was nearly 1 year ago on 10/31/19.  Pre-op covering staff: - Please schedule appointment and call patient to inform them. If patient already had an upcoming appointment within acceptable timeframe, please add "pre-op clearance" to the appointment notes so provider is aware. - Please contact requesting surgeon's office via preferred method (i.e, phone, fax) to inform them of need for appointment prior to surgery.  No anticoag/antiplatelets listed on MAR to hold at this time.  Charlie Pitter, PA-C  10/26/2020, 2:36 PM

## 2020-10-26 NOTE — Telephone Encounter (Signed)
   [facility] Pre-operative Risk Assessment    Patient Name: Brandy Maxwell  DOB: 1955/07/05 MRN: [id number]  HEARTCARE STAFF:  - IMPORTANT!!!!!! Under Visit Info/Reason for Call, type in Other and utilize the format Clearance MM/DD/YY or Clearance TBD. Do not use dashes or single digits. - Please review there is not already an duplicate clearance open for this procedure. - If request is for dental extraction, please clarify the # of teeth to be extracted. - If the patient is currently at the dentist's office, call Pre-Op Callback Staff (MA/nurse) to input urgent request.  - If the patient is not currently in the dentist office, please route to the Pre-Op pool.  Request for surgical clearance:  What type of surgery is being performed? [facility] / Possible Lumpectomy / Mastectomy  When is this surgery scheduled? TBD  What type of clearance is required (medical clearance vs. Pharmacy clearance to hold med vs. Both)? Medical  Are there any medications that need to be held prior to surgery and how long? Not Noted  Practice name and name of physician performing surgery?  [facility] / Dr Marylu Lund, MD, FACS  What is the office phone number? [phone]   7.   What is the office fax number? [phone]  8.   Anesthesia type (None, local, MAC, general) ?  None Noted  Toni Arthurs 10/26/2020, 1:34 PM  _________________________________________________________________   (provider comments below)

## 2020-10-27 ENCOUNTER — Other Ambulatory Visit: Payer: Self-pay

## 2020-10-27 NOTE — Telephone Encounter (Signed)
Pt has appt with Dr. Geraldo Pitter 10/29/20 for pre op appt. Will send notes to MD for appt. Will send FYI to surgeon's office pt has appt.

## 2020-10-29 ENCOUNTER — Ambulatory Visit (INDEPENDENT_AMBULATORY_CARE_PROVIDER_SITE_OTHER): Payer: Medicare Other | Admitting: Cardiology

## 2020-10-29 ENCOUNTER — Other Ambulatory Visit: Payer: Self-pay

## 2020-10-29 ENCOUNTER — Encounter: Payer: Self-pay | Admitting: Cardiology

## 2020-10-29 VITALS — BP 110/78 | HR 86 | Ht 65.0 in | Wt 197.6 lb

## 2020-10-29 DIAGNOSIS — E782 Mixed hyperlipidemia: Secondary | ICD-10-CM

## 2020-10-29 DIAGNOSIS — R0609 Other forms of dyspnea: Secondary | ICD-10-CM

## 2020-10-29 DIAGNOSIS — Z01810 Encounter for preprocedural cardiovascular examination: Secondary | ICD-10-CM

## 2020-10-29 HISTORY — DX: Other forms of dyspnea: R06.09

## 2020-10-29 NOTE — Progress Notes (Signed)
Cardiology Office Note:    Date:  10/29/2020   ID:  Brandy Maxwell, DOB 1955/12/01, MRN [id number]  PCP:  Enid Skeens., MD  Cardiologist:  Jenean Lindau, MD   Referring MD: Enid Skeens., MD    ASSESSMENT:    1. Mixed hyperlipidemia   2. Preop cardiovascular exam   3. Dyspnea on exertion    PLAN:    In order of problems listed above:  Primary prevention stressed with the patient.  Importance of compliance with diet medication stressed and she vocalized understanding. Preop cardiovascular risk stratification: I discussed this with the patient at extensive length.  She has multiple risk factors for coronary artery disease and has dyspnea on exertion.  I would like to evaluate her with a Lexiscan sestamibi.  If this is negative then she is not at high risk for coronary events during the aforementioned surgery.  Medical hemodynamic monitoring will further reduce the risk of coronary events.  Prefer that beta-blocker therapies are uninterrupted in the perioperative period. Mixed dyslipidemia: Managed by primary care.  Diet emphasized.  Weight reduction stressed. Breast cancer: She tells me that she is going to need surgery for this and also oral surgery for dental issues. With recommendations will be made based on the findings of the stress test.Patient will be seen in follow-up appointment in 6 months or earlier if the patient has any concerns    Medication Adjustments/Labs and Tests Ordered: Current medicines are reviewed at length with the patient today.  Concerns regarding medicines are outlined above.  No orders of the defined types were placed in this encounter.  No orders of the defined types were placed in this encounter.    No chief complaint on file.    History of Present Illness:    Brandy Maxwell is a 65 y.o. female.  Patient has past medical history of palpitations, mixed dyslipidemia.  She has breast cancer and and she has issues for which she needs oral  surgery and breast cancer surgery.  She has dyspnea on exertion.  She has multiple risk factors for coronary artery disease.  She is sent here for preop evaluation.  She denies any chest pain orthopnea or PND.  At the time of my evaluation, the patient is alert awake oriented and in no distress.  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 02/12/2013   Depression with anxiety 08/30/2013   Diet-controlled diabetes mellitus ([name]) 10/31/2019   Difficulty in walking 08/30/2013   Disorder of sacrum 02/12/2013   Epilepsy ([place]) 08/30/2013   Excessive daytime sleepiness 07/03/2017   Family history of first degree relative with dementia 10/26/2017   Fibromyalgia    Gait abnormality 07/10/2012   Herpes zoster with nervous system complications 5/73/2202   History of seizure disorder 03/17/2019   Hypercholesteremia    Hyperlipidemia 08/30/2013   IBS (irritable bowel syndrome)    Insomnia 09/07/2015   Malignant neoplasm of central portion of left breast in female, estrogen receptor positive ([place]) 10/26/2020   Mass of upper outer quadrant of left breast 05/21/2017   Medication management 01/31/2019   Migraine headache 08/30/2013   Mild cognitive impairment 03/17/2019  Myalgia and myositis 02/12/2013   Neurogenic bladder 05/21/2020   Neuropathic pain 07/22/2020   Neuropathy 03/17/2019   Obesity, Class I, BMI 30-34.9 03/17/2019   Obstructive sleep apnea (adult) (pediatric) 08/30/2013   Pain in thoracic spine 02/12/2013   Palpitations 09/23/2019   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   Postlaminectomy syndrome, lumbar region 02/12/2013   Postoperative examination  06/19/2017   Postural hypotension 09/23/2019   Restless legs syndrome 05/22/2017   Risk for falls 09/07/2015   Seizures ([place])    Sleep apnea    nightly cpap   Spondylosis of thoracic region without myelopathy or radiculopathy 02/12/2013   Thoracic or lumbosacral neuritis or radiculitis 02/12/2013   Thoracic spondylosis with myelopathy 02/12/2013   Tremors of nervous system    Urinary incontinence 03/17/2019   Urinary incontinence, mixed 05/29/2013   Vitamin B 12 deficiency 03/17/2019   Vitamin D deficiency 03/17/2019   Weakness 07/10/2012    Past Surgical History:  Procedure Laterality Date   BLADDER REPAIR  03/13/2018   BLADDER SUSPENSION  03/13/2018   BREAST LUMPECTOMY  2019   COLONOSCOPY  12/20/2012   Left colonic diverticulosis predominantly in the sigmoid colon. Small internal hemmorhoids. Otherwise normal colonoscopy.    LAMINECTOMY  1998    Current Medications: Current Meds  Medication Sig   amoxicillin (AMOXIL) 500 MG capsule Take 1,500 mg by mouth daily.   dicyclomine (BENTYL) 10 MG capsule Take 10 mg by mouth 3 (three) times daily as needed for pain. With IBS   doxycycline (VIBRA-TABS) 100 MG tablet Take 100 mg by mouth 2 (two) times daily.   DULoxetine (CYMBALTA) 60 MG capsule Take 60 mg by mouth daily.   EPINEPHrine 0.3 mg/0.3 mL IJ SOAJ injection Inject 0.3 mg into the muscle as needed for anaphylaxis.   ezetimibe (ZETIA) 10 MG tablet Take 10 mg by mouth daily.   fenofibrate 160 MG tablet Take 160 mg by mouth daily.   gabapentin (NEURONTIN) 600 MG tablet Take 1,200 mg by mouth 3 (three) times daily.   lidocaine (LIDODERM) 5 % Place 1 patch onto the skin as needed for pain.   LINZESS 72 MCG capsule TAKE 1 CAPSULE BY MOUTH DAILY.   metoprolol succinate (TOPROL-XL) 25 MG 24 hr tablet TAKE 1 TABLET BY MOUTH EVERY DAY   metroNIDAZOLE (METROCREAM) 0.75 % cream Apply 1 application topically daily.   MYRBETRIQ 50 MG TB24 tablet Take 50 mg by mouth daily.   pramipexole (MIRAPEX) 1  MG tablet Take 0.5 mg by mouth daily.   traMADol (ULTRAM) 50 MG tablet Take 50 mg by mouth 2 (two) times daily as needed for pain.   Vitamin D, Ergocalciferol, (DRISDOL) 1.25 MG (50000 UT) CAPS capsule TAKE 1 CAPSULE BY MOUTH ONCE A WEEK FOR 30 DAYS     Allergies:   Nitrofurantoin and Sulfa antibiotics   Social History   Socioeconomic History   Marital status: Married    Spouse name: Not on file   Number of children: Not on file   Years of education: Not on file   Highest education level: Not on file  Occupational History   Not on file  Tobacco Use   Smoking status: Never   Smokeless tobacco: Never  Vaping Use   Vaping Use: Never used  Substance and Sexual Activity   Alcohol use: Never   Drug use: Never   Sexual activity: Not on file  Other Topics Concern   Not on file  Social History Narrative   Not on file  Social Determinants of Health   Financial Resource Strain: Not on file  Food Insecurity: Not on file  Transportation Needs: Not on file  Physical Activity: Not on file  Stress: Not on file  Social Connections: Not on file     Family History: The patient's family history includes Allergic rhinitis in her sister; Alzheimer's disease in her father and mother; Clotting disorder in her father; Colon cancer in her maternal grandfather; Heart Problems in her father; Hypercholesterolemia in her mother; Uterine cancer in her maternal aunt. There is no history of Esophageal cancer, Stomach cancer, or Rectal cancer.  ROS:   Please see the history of present illness.    All other systems reviewed and are negative.  EKGs/Labs/Other Studies Reviewed:    The following studies were reviewed today: I discussed my findings with the patient at length.   Recent Labs: No results found for requested labs within last 8760 hours.  Recent Lipid Panel No results found for: CHOL, TRIG, HDL, CHOLHDL, VLDL, LDLCALC, LDLDIRECT  Physical Exam:    VS:  BP 110/78   Pulse 86   Ht  5\' 5"  (1.651 m)   Wt 197 lb 9.6 oz (89.6 kg)   SpO2 96%   BMI 32.88 kg/m     Wt Readings from Last 3 Encounters:  10/29/20 197 lb 9.6 oz (89.6 kg)  10/31/19 190 lb (86.2 kg)  09/23/19 193 lb (87.5 kg)     GEN: Patient is in no acute distress HEENT: Normal NECK: No JVD; No carotid bruits LYMPHATICS: No lymphadenopathy CARDIAC: Hear sounds regular, 2/6 systolic murmur at the apex. RESPIRATORY:  Clear to auscultation without rales, wheezing or rhonchi  ABDOMEN: Soft, non-tender, non-distended MUSCULOSKELETAL:  No edema; No deformity  SKIN: Warm and dry NEUROLOGIC:  Alert and oriented x 3 PSYCHIATRIC:  Normal affect   Signed, Jenean Lindau, MD  10/29/2020 9:27 AM    [facility]

## 2020-10-29 NOTE — Patient Instructions (Signed)
Medication Instructions:  Your physician recommends that you continue on your current medications as directed. Please refer to the Current Medication list given to you today.  *If you need a refill on your cardiac medications before your next appointment, please call your pharmacy*   Lab Work: None ordered If you have labs (blood work) drawn today and your tests are completely normal, you will receive your results only by: MyChart Message (if you have MyChart) OR A paper copy in the mail If you have any lab test that is abnormal or we need to change your treatment, we will call you to review the results.   Testing/Procedures: Your physician has requested that you have a lexiscan myoview. For further information please visit [email or web address]. Please follow instruction sheet, as given.  The test will take approximately 3 to 4 hours to complete; you may bring reading material.  If someone comes with you to your appointment, they will need to remain in the main lobby due to limited space in the testing area.   How to prepare for your Myocardial Perfusion Test: Do not eat or drink 3 hours prior to your test, except you may have water. Do not consume products containing caffeine (regular or decaffeinated) 12 hours prior to your test. (ex: coffee, chocolate, sodas, tea). Do bring a list of your current medications with you.  If not listed below, you may take your medications as normal. Do wear comfortable clothes (no dresses or overalls) and walking shoes, tennis shoes preferred (No heels or open toe shoes are allowed). Do NOT wear cologne, perfume, aftershave, or lotions (deodorant is allowed). If these instructions are not followed, your test will have to be rescheduled.    Follow-Up: At [facility], you and your health needs are our priority.  As part of our continuing mission to provide you with exceptional heart care, we have created designated Provider Care Teams.  These Care Teams  include your primary Cardiologist (physician) and Advanced Practice Providers (APPs -  Physician Assistants and Nurse Practitioners) who all work together to provide you with the care you need, when you need it.  We recommend signing up for the patient portal called "MyChart".  Sign up information is provided on this After Visit Summary.  MyChart is used to connect with patients for Virtual Visits (Telemedicine).  Patients are able to view lab/test results, encounter notes, upcoming appointments, etc.  Non-urgent messages can be sent to your provider as well.   To learn more about what you can do with MyChart, go to [email or web address].    Your next appointment:   6 month(s)  The format for your next appointment:   In Person  Provider:   Rajan Revankar, MD   Other Instructions Cardiac Nuclear Scan A cardiac nuclear scan is a test that is done to check the flow of blood to your heart. It is done when you are resting and when you are exercising. The test looks for problems such as: Not enough blood reaching a portion of the heart. The heart muscle not working as it should. You may need this test if: You have heart disease. You have had lab results that are not normal. You have had heart surgery or a balloon procedure to open up blocked arteries (angioplasty). You have chest pain. You have shortness of breath. In this test, a special dye (tracer) is put into your bloodstream. The tracer will travel to your heart. A camera will then take pictures of your heart to see   how the tracer moves through your heart. This test is usually done at a hospital and takes 2-4 hours. Tell a doctor about: Any allergies you have. All medicines you are taking, including vitamins, herbs, eye drops, creams, and over-the-counter medicines. Any problems you or family members have had with anesthetic medicines. Any blood disorders you have. Any surgeries you have had. Any medical conditions you  have. Whether you are pregnant or may be pregnant. What are the risks? Generally, this is a safe test. However, problems may occur, such as: Serious chest pain and heart attack. This is only a risk if the stress portion of the test is done. Rapid heartbeat. A feeling of warmth in your chest. This feeling usually does not last long. Allergic reaction to the tracer. What happens before the test? Ask your doctor about changing or stopping your normal medicines. This is important. Follow instructions from your doctor about what you cannot eat or drink. Remove your jewelry on the day of the test. What happens during the test? An IV tube will be inserted into one of your veins. Your doctor will give you a small amount of tracer through the IV tube. You will wait for 20-40 minutes while the tracer moves through your bloodstream. Your heart will be monitored with an electrocardiogram (ECG). You will lie down on an exam table. Pictures of your heart will be taken for about 15-20 minutes. You may also have a stress test. For this test, one of these things may be done: You will be asked to exercise on a treadmill or a stationary bike. You will be given medicines that will make your heart work harder. This is done if you are unable to exercise. When blood flow to your heart has peaked, a tracer will again be given through the IV tube. After 20-40 minutes, you will get back on the exam table. More pictures will be taken of your heart. Depending on the tracer that is used, more pictures may need to be taken 3-4 hours later. Your IV tube will be removed when the test is over. The test may vary among doctors and hospitals. What happens after the test? Ask your doctor: Whether you can return to your normal schedule, including diet, activities, and medicines. Whether you should drink more fluids. This will help to remove the tracer from your body. Drink enough fluid to keep your pee (urine) pale  yellow. Ask your doctor, or the department that is doing the test: When will my results be ready? How will I get my results? Summary A cardiac nuclear scan is a test that is done to check the flow of blood to your heart. Tell your doctor whether you are pregnant or may be pregnant. Before the test, ask your doctor about changing or stopping your normal medicines. This is important. Ask your doctor whether you can return to your normal activities. You may be asked to drink more fluids. This information is not intended to replace advice given to you by your health care provider. Make sure you discuss any questions you have with your health care provider. Document Revised: 04/10/2018 Document Reviewed: 06/04/2017 Elsevier Patient Education  2021 Elsevier Inc.    

## 2020-11-01 ENCOUNTER — Telehealth (HOSPITAL_COMMUNITY): Payer: Self-pay | Admitting: *Deleted

## 2020-11-01 ENCOUNTER — Other Ambulatory Visit: Payer: Self-pay | Admitting: Vascular Surgery

## 2020-11-01 DIAGNOSIS — C50912 Malignant neoplasm of unspecified site of left female breast: Secondary | ICD-10-CM

## 2020-11-01 NOTE — Telephone Encounter (Signed)
Left message on voicemail per DPR in reference to upcoming appointment scheduled on  11/04/20 with detailed instructions given per Myocardial Perfusion Study Information Sheet for the test. LM to arrive 15 minutes early, and that it is imperative to arrive on time for appointment to keep from having the test rescheduled. If you need to cancel or reschedule your appointment, please call the office within 24 hours of your appointment. Failure to do so may result in a cancellation of your appointment, and a $50 no show fee. Phone number given for call back for any questions. Mell Mellott Jacqueline   

## 2020-11-04 ENCOUNTER — Other Ambulatory Visit: Payer: Self-pay

## 2020-11-04 ENCOUNTER — Ambulatory Visit (INDEPENDENT_AMBULATORY_CARE_PROVIDER_SITE_OTHER): Payer: Medicare Other

## 2020-11-04 DIAGNOSIS — R0609 Other forms of dyspnea: Secondary | ICD-10-CM | POA: Diagnosis not present

## 2020-11-04 DIAGNOSIS — Z01810 Encounter for preprocedural cardiovascular examination: Secondary | ICD-10-CM | POA: Diagnosis not present

## 2020-11-04 LAB — MYOCARDIAL PERFUSION IMAGING
LV dias vol: 55 mL (ref 46–106)
LV sys vol: 7 mL
Nuc Stress EF: 86 %
Peak HR: 100 {beats}/min
Rest HR: 87 {beats}/min
Rest Nuclear Isotope Dose: 10.4 mCi
SDS: 3
SRS: 5
SSS: 8
ST Depression (mm): 0 mm
Stress Nuclear Isotope Dose: 32.1 mCi
TID: 1.12

## 2020-11-04 MED ORDER — REGADENOSON 0.4 MG/5ML IV SOLN
0.4000 mg | Freq: Once | INTRAVENOUS | Status: AC
  Administered 2020-11-04: 0.4 mg via INTRAVENOUS

## 2020-11-04 MED ORDER — TECHNETIUM TC 99M TETROFOSMIN IV KIT
32.1000 | PACK | Freq: Once | INTRAVENOUS | Status: AC | PRN
  Administered 2020-11-04: 32.1 via INTRAVENOUS

## 2020-11-04 MED ORDER — TECHNETIUM TC 99M TETROFOSMIN IV KIT
10.4000 | PACK | Freq: Once | INTRAVENOUS | Status: AC | PRN
  Administered 2020-11-04: 10.4 via INTRAVENOUS

## 2020-11-08 ENCOUNTER — Telehealth: Payer: Self-pay

## 2020-11-08 NOTE — Telephone Encounter (Signed)
-----   Message from Jenean Lindau, MD sent at 11/05/2020  8:13 AM EDT ----- The results of the study is unremarkable. Please inform patient. I will discuss in detail at next appointment. Cc  primary care/referring physician Jenean Lindau, MD 11/05/2020 8:13 AM

## 2020-11-08 NOTE — Telephone Encounter (Signed)
Left message on patients voicemail to please return our call.   

## 2020-11-09 NOTE — Telephone Encounter (Signed)
Brandy Maxwell is returning [employer].

## 2020-11-09 NOTE — Telephone Encounter (Signed)
Spoke with patient regarding results and recommendation.  Patient verbalizes understanding and is agreeable to plan of care. Advised patient to call back with any issues or concerns.  

## 2020-11-17 ENCOUNTER — Ambulatory Visit (INDEPENDENT_AMBULATORY_CARE_PROVIDER_SITE_OTHER): Payer: Medicare Other | Admitting: Gastroenterology

## 2020-11-17 ENCOUNTER — Encounter: Payer: Self-pay | Admitting: Gastroenterology

## 2020-11-17 ENCOUNTER — Other Ambulatory Visit: Payer: Self-pay

## 2020-11-17 VITALS — BP 110/72 | HR 88 | Ht 65.0 in | Wt 198.0 lb

## 2020-11-17 DIAGNOSIS — R102 Pelvic and perineal pain: Secondary | ICD-10-CM | POA: Diagnosis not present

## 2020-11-17 DIAGNOSIS — G8929 Other chronic pain: Secondary | ICD-10-CM

## 2020-11-17 DIAGNOSIS — K581 Irritable bowel syndrome with constipation: Secondary | ICD-10-CM

## 2020-11-17 MED ORDER — LINZESS 72 MCG PO CAPS: 72.0000 ug | ORAL_CAPSULE | Freq: Every day | ORAL | 4 refills | Status: DC

## 2020-11-17 NOTE — Progress Notes (Signed)
Chief Complaint:   Referring Provider:  Enid Skeens., MD      ASSESSMENT AND PLAN;     #1. Chronic pelvic pain. S/P bladder urethral sling 03/2018.  Being followed by urology (Dr. Venia Minks)  #2. IBS-C   Plan: -Linzess 72 mcg p.o. once a day #90, 4 refills -Call if any problems.    HPI:    Brandy Maxwell is a 64 y.o. female with chronic fatigue syndrome, fibromyalgia, chronic pain syndrome being followed by pain clinic, H LD, OSA on CPAP, seizure disorder with longstanding urinary problems including urinary incontinence SP retropubic mid urethral sling on 03/13/2018 with continued urge incontinence and chronic pelvic pain.  Here for linzess refill.   Awaiting  L mastectomy Dr Noberto Retort  Also has longstanding history of constipation with pellet-like stools.  Most recently she has been taking Linzess 72 mcg p.o. once a day and would still be constipated with bowel movements at the frequency of 1 per 2 to 3 days.  She occasionally would have diarrhea.  No melena or hematochezia.  She attributes part of the pelvic pain to "colonic spasms".  Very much concerned as her maternal grandmother had colon cancer.   Past GI procedures:  Colon 02/2019 -Mild left colonic diverticulosis. -Non-bleeding internal hemorrhoids. -Otherwise normal colonoscopy to TI. -Rpt in 10 yrs  -Colonoscopy 12/2012 ([facility])-mild sigmoid diverticulosis, small internal hemorrhoids.  Advised repeat in 43yr due to [place] 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 02/12/2013   Depression with anxiety 08/30/2013   Diet-controlled diabetes mellitus ([place]) 10/31/2019    Difficulty in walking 08/30/2013   Disorder of sacrum 02/12/2013   Epilepsy ([place]) 08/30/2013   Excessive daytime sleepiness 07/03/2017   Family history of first degree relative with dementia 10/26/2017   Fibromyalgia    Gait abnormality 07/10/2012   Herpes zoster with nervous system complications 9/89/2119   History of seizure disorder 03/17/2019   Hypercholesteremia    Hyperlipidemia 08/30/2013   IBS (irritable bowel syndrome)    Insomnia 09/07/2015   Malignant neoplasm of central portion of left breast in female, estrogen receptor positive ([place]) 10/26/2020   Mass of upper outer quadrant of left breast 05/21/2017   Medication management 01/31/2019   Migraine headache 08/30/2013   Mild cognitive impairment 03/17/2019   Myalgia and myositis 02/12/2013   Neurogenic bladder 05/21/2020   Neuropathic pain 07/22/2020   Neuropathy 03/17/2019   Obesity, Class I, BMI 30-34.9 03/17/2019   Obstructive sleep apnea (adult) (pediatric) 08/30/2013   Pain in thoracic spine 02/12/2013   Palpitations 09/23/2019   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   Postlaminectomy syndrome, lumbar region 02/12/2013   Postoperative examination 06/19/2017   Postural hypotension 09/23/2019   Restless legs syndrome 05/22/2017   Risk for falls 09/07/2015   Seizures ([facility])    Sleep apnea    nightly cpap   Spondylosis of thoracic region without myelopathy or radiculopathy 02/12/2013   Thoracic or lumbosacral neuritis or radiculitis 02/12/2013   Thoracic spondylosis with myelopathy 02/12/2013   Tremors of nervous system    Urinary incontinence 03/17/2019   Urinary incontinence, mixed 05/29/2013   Vitamin B 12 deficiency 03/17/2019   Vitamin  D deficiency 03/17/2019   Weakness 07/10/2012    Past Surgical History:  Procedure Laterality Date   BLADDER REPAIR  03/13/2018   BLADDER SUSPENSION  03/13/2018   BREAST LUMPECTOMY  2019   COLONOSCOPY  12/20/2012   Left colonic diverticulosis predominantly in the sigmoid colon. Small internal  hemmorhoids. Otherwise normal colonoscopy.    LAMINECTOMY  1998    Family History  Problem Relation Age of Onset   Hypercholesterolemia Mother    Alzheimer's disease Mother    Heart Problems Father    Clotting disorder Father    Alzheimer's disease Father    Allergic rhinitis Sister    Colon cancer Maternal Grandfather    Uterine cancer Maternal Aunt    Esophageal cancer Neg Hx    Stomach cancer Neg Hx    Rectal cancer Neg Hx     Social History   Tobacco Use   Smoking status: Never   Smokeless tobacco: Never  Vaping Use   Vaping Use: Never used  Substance Use Topics   Alcohol use: Never   Drug use: Never    Current Outpatient Medications  Medication Sig Dispense Refill   dicyclomine (BENTYL) 10 MG capsule Take 10 mg by mouth 3 (three) times daily as needed for pain. With IBS     DULoxetine (CYMBALTA) 60 MG capsule Take 60 mg by mouth daily.     EPINEPHrine 0.3 mg/0.3 mL IJ SOAJ injection Inject 0.3 mg into the muscle as needed for anaphylaxis.     ezetimibe (ZETIA) 10 MG tablet Take 10 mg by mouth daily.     fenofibrate 160 MG tablet Take 160 mg by mouth daily.     gabapentin (NEURONTIN) 600 MG tablet Take 1,200 mg by mouth 3 (three) times daily.     lidocaine (LIDODERM) 5 % Place 1 patch onto the skin as needed for pain.     LINZESS 72 MCG capsule TAKE 1 CAPSULE BY MOUTH DAILY. 30 capsule 0   metoprolol succinate (TOPROL-XL) 25 MG 24 hr tablet TAKE 1 TABLET BY MOUTH EVERY DAY 90 tablet 2   MYRBETRIQ 50 MG TB24 tablet Take 50 mg by mouth daily.     pramipexole (MIRAPEX) 1 MG tablet Take 0.5 mg by mouth daily.     traMADol (ULTRAM) 50 MG tablet Take 50 mg by mouth 2 (two) times daily as needed for pain.     Vitamin D, Ergocalciferol, (DRISDOL) 1.25 MG (50000 UT) CAPS capsule TAKE 1 CAPSULE BY MOUTH ONCE A WEEK FOR 30 DAYS     No current facility-administered medications for this visit.    Allergies  Allergen Reactions   Nitrofurantoin Anaphylaxis   Sulfa  Antibiotics Rash, Other (See Comments) and Hives    Other reaction(s): Rash     Review of Systems:  neg     Physical Exam:    BP 110/72   Pulse 88   Ht 5\' 5"  (1.651 m)   Wt 198 lb (89.8 kg)   SpO2 98%   BMI 32.95 kg/m  Wt Readings from Last 3 Encounters:  11/17/20 198 lb (89.8 kg)  11/04/20 197 lb (89.4 kg)  10/29/20 197 lb 9.6 oz (89.6 kg)  Gen: awake, alert, NAD HEENT: anicteric, no pallor CV: RRR, no mrg Pulm: CTA b/l Abd: soft, NT/ND, +BS throughout Ext: no c/c/e Neuro: nonfocal      Carmell Austria, MD 11/17/2020, 11:28 AM  Cc: Enid Skeens., MD

## 2020-11-17 NOTE — Patient Instructions (Signed)
If you are age 64 or older, your body mass index should be between 23-30. Your Body mass index is 32.95 kg/m. If this is out of the aforementioned range listed, please consider follow up with your Primary Care Provider.  If you are age 33 or younger, your body mass index should be between 19-25. Your Body mass index is 32.95 kg/m. If this is out of the aformentioned range listed, please consider follow up with your Primary Care Provider.   ________________________________________________________  The [facility] providers would like to encourage you to use [facility] to communicate with providers for non-urgent requests or questions.  Due to long hold times on the telephone, sending your provider a message by [facility] may be a faster and more efficient way to get a response.  Please allow 48 business hours for a response.  Please remember that this is for non-urgent requests.  _______________________________________________________  We have given you samples of the following medication to take: Linzess 3mcg  Kegel Exercises Kegel exercises can help strengthen your pelvic floor muscles. The pelvic floor is a group of muscles that support your rectum, small intestine, and bladder. In females, pelvic floor muscles also help support the uterus. These muscles help you control the flow of urine and stool (feces). Kegel exercises are painless and simple. They do not require any equipment. Your provider may suggest Kegel exercises to: Improve bladder and bowel control. Improve sexual response. Improve weak pelvic floor muscles after surgery to remove the uterus (hysterectomy) or after pregnancy, in females. Improve weak pelvic floor muscles after prostate gland removal or surgery, in males. Kegel exercises involve squeezing your pelvic floor muscles. These are the same muscles you squeeze when you try to stop the flow of urine or keep from passing gas. The exercises can be done while sitting, standing, or  lying down, but it is best to vary your position. Ask your health care provider which exercises are safe for you. Do exercises exactly as told by your health care provider and adjust them as directed. Do not begin these exercises until told by your health care provider. Exercises How to do Kegel exercises: Squeeze your pelvic floor muscles tight. You should feel a tight lift in your rectal area. If you are a female, you should also feel a tightness in your vaginal area. Keep your stomach, buttocks, and legs relaxed. Hold the muscles tight for up to 10 seconds. Breathe normally. Relax your muscles for up to 10 seconds. Repeat as told by your health care provider. Repeat this exercise daily as told by your health care provider. Continue to do this exercise for at least 4-6 weeks, or for as long as told by your health care provider. You may be referred to a physical therapist who can help you learn more about how to do Kegel exercises. Depending on your condition, your health care provider may recommend: Varying how long you squeeze your muscles. Doing several sets of exercises every day. Doing exercises for several weeks. Making Kegel exercises a part of your regular exercise routine. This information is not intended to replace advice given to you by your health care provider. Make sure you discuss any questions you have with your health care provider. Document Revised: 04/29/2020 Document Reviewed: 04/29/2020 Elsevier Patient Education  2022 [employer].

## 2020-11-21 ENCOUNTER — Other Ambulatory Visit: Payer: Self-pay

## 2020-11-21 ENCOUNTER — Ambulatory Visit
Admission: RE | Admit: 2020-11-21 | Discharge: 2020-11-21 | Disposition: A | Discharge status: Home / Self Care | Payer: Medicare Other | Source: Ambulatory Visit | Attending: Vascular Surgery | Admitting: Vascular Surgery

## 2020-11-21 DIAGNOSIS — C50912 Malignant neoplasm of unspecified site of left female breast: Secondary | ICD-10-CM

## 2020-11-21 MED ORDER — GADOBUTROL 1 MMOL/ML IV SOLN
9.0000 mL | Freq: Once | INTRAVENOUS | Status: AC | PRN
  Administered 2020-11-21: 9 mL via INTRAVENOUS

## 2020-11-29 ENCOUNTER — Telehealth: Payer: Self-pay

## 2020-11-29 NOTE — Telephone Encounter (Signed)
   [facility] Pre-operative Risk Assessment    Patient Name: Brandy Maxwell  DOB: 03/31/1955 MRN: [id number]  HEARTCARE STAFF:  - IMPORTANT!!!!!! Under Visit Info/Reason for Call, type in Other and utilize the format Clearance MM/DD/YY or Clearance TBD. Do not use dashes or single digits. - Please review there is not already an duplicate clearance open for this procedure. - If request is for dental extraction, please clarify the # of teeth to be extracted. - If the patient is currently at the dentist's office, call Pre-Op Callback Staff (MA/nurse) to input urgent request.  - If the patient is not currently in the dentist office, please route to the Pre-Op pool.  Request for surgical clearance:  What type of surgery is being performed? Left Mastectomy  When is this surgery scheduled? 12/17/20  What type of clearance is required (medical clearance vs. Pharmacy clearance to hold med vs. Both)? Medical  Are there any medications that need to be held prior to surgery and how long?   Practice name and name of physician performing surgery? [facility]- Dr. Noberto Retort  What is the office phone number? [phone]   7.   What is the office fax number? [phone]  8.   Anesthesia type (None, local, MAC, general) ? General   Lowella Grip 11/29/2020, 4:53 PM  _________________________________________________________________   (provider comments below)

## 2020-11-30 NOTE — Telephone Encounter (Signed)
   Name: Brandy Maxwell  DOB: 1955-07-06  MRN: [id number]   Primary Cardiologist: Jenean Lindau, MD  Chart reviewed as part of pre-operative protocol coverage.   Brandy Maxwell was last seen on 10/29/20 by Dr. Geraldo Pitter. Given her risk factors, she was referred for nuclear stress test on 11/04/20 which was negative for ischemia.   Therefore, based on ACC/AHA guidelines, the patient would be at acceptable risk for the planned procedure without further cardiovascular testing.    I will route this recommendation to the requesting party via Epic fax function and remove from pre-op pool. Please call with questions.  Tami Lin Hilman Kissling, PA 11/30/2020, 11:30 AM

## 2020-12-17 ENCOUNTER — Encounter: Payer: Self-pay | Admitting: Oncology

## 2020-12-28 DIAGNOSIS — C773 Secondary and unspecified malignant neoplasm of axilla and upper limb lymph nodes: Secondary | ICD-10-CM | POA: Insufficient documentation

## 2020-12-28 HISTORY — DX: Secondary and unspecified malignant neoplasm of axilla and upper limb lymph nodes: C77.3

## 2020-12-31 ENCOUNTER — Telehealth: Payer: Self-pay | Admitting: Oncology

## 2020-12-31 NOTE — Telephone Encounter (Signed)
Scheduled appt per 12/28 referral. Pt is aware of appt date and time. Pt is aware to arrive 15 mins prior to appt time.

## 2021-01-14 ENCOUNTER — Telehealth: Payer: Self-pay | Admitting: Oncology

## 2021-01-14 NOTE — Telephone Encounter (Signed)
Pt contacted the office requesting information on how many family members can attend her NP appt. Per Levada Dy, LPN  pt can have 2 additional support people at the time of visit.

## 2021-01-18 NOTE — Progress Notes (Signed)
[facility]  [address] [place],  [place]  [address] [phone]  Clinic Day:  01/19/2021  Referring physician: Marylu Lund., MD  This document serves as a record of services personally performed by Hosie Poisson, MD. It was created on their behalf by [facility] E, a trained medical scribe. The creation of this record is based on the scribe's personal observations and the provider's statements to them.  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 She has undergone left mastectomy but one node was positive for malignancy. Adjuvant radiation will not be required, but I definitely recommend hormonal therapy. I am concerned about her risk for recurrence in view of the positive node, grade 2 histology, and the low level of estrogen receptors with negative progesterone receptors. We will plan to pursue EndoPredict testing to determine how much she would benefit from adjuvant chemotherapy. I explained this procedure and the benefits of it, and she agrees.  Significant family history of multiple malignancies as well as thromboses. I will research whether she qualifies for genetic testing.   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  This is a pleasant 66 year old female recently diagnosed with stage IIA left breast cancer. She has already undergone left mastectomy with Dr. Noberto Retort but one sentinel lymph node was positive for malignancy. The diagnosis, prognosis, pathology and prognostic indicators were reviewed with the patient and her family. As she had one positive node, CT chest, abdomen and pelvis will be obtained to rule out other evidence of metastases. It is difficult to determine whether she would benefit from adjuvant chemotherapy and so we discussed EndoPredict testing to assist in decision  making, and she is in agreement. We will plan to see her back in 2 weeks to review the results and finalize a treatment plan. Even though her estrogen receptor was low positive, she would still likely benefit from endocrine therapy with an aromatase inhibitor such as anastrozole for a total of 5-10 years. We will schedule her for baseline bone density. She and her family understand and agree with this plan of care. I have answered her questions and she knows to call with any concerns.  Thank you for the opportunity to participate in the care of your patients  I provided 50 minutes of face-to-face time during this this encounter and > 50% was spent counseling as documented under my assessment and plan.    [name], MD [facility] AT [facility] [address] 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 female referred by Dr. Jerel Shepherd for the evaluation and treatment of breast cancer. This was found on screening bilateral mammogram from September 2022 which revealed a possible mass within the left breast. Right breast is without masses. Diagnostic unilateral left mammogram confirmed an architectural distortion at the left breast 12 o'clock. Biopsy was pursed in October and surgical pathology from this procedure revealed invasive mammary carcinoma and mammary carcinoma in situ. HER2 was negative (0). Estrogen receptor was positive at 30% and progesterone receptor was negative at 0%. Ki67 was 1%. She  was referred to Dr. Cipriano Mile and underwent left mastectomy and sentinel lymph node biopsy on December 16th. Final pathology confirmed invasive ductal carcinoma, grade 2, measuring 7 mm, and ductal carcinoma in situ.  All margins were negative. One sentinel lymph nodes was positive for malignancy (1/1) for a T1b N1a M0, stage IIA. Blood counts and chemistries were unremarkable.  INTERVAL HISTORY:  I have reviewed her chart and materials related to her cancer extensively and collaborated history with the patient. Summary of oncologic history is as follows: Oncology History   No history exists.    Miara states that she is healing well from her procedure. She has continued annual mammography for the past 8 years. She did have atypical ductal hyperplasia within a complex sclerosing lesion of the right breast in 2019. She has arthralgias and back pain from degenerative changes. She also has been diagnosed with fibromyalgia. She reports constant tinnitus. 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 She has occasional tremor. She is unsteady on her feet and so uses a cane to ambulate. She does have occasions of urinary incontinence as well as bowel incontinence. She is vitamin D deficient and continues oral supplement 50,000 units weekly. She is also deficient in B12 and continues injections. Of note, she has a significant family history of multiple malignancies as well as thromboses. She states that they have not undergone genetic testing. She has met with Dr. Orlene Erm today, and adjuvant radiation therapy will not be required. 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 Her  appetite is good, and she is eating well. She denies any significant unintentional weight loss or gain.  She denies fever, chills or other signs of infection.  She denies nausea, vomiting, bowel issues, or abdominal pain.  She denies sore throat, cough, dyspnea, or chest pain. She started menarche at age 4. She gave birth to her 1st child at age 29 and her last child at age 5. She went through menopause in her 72s. She was on hormone replacement therapy for a short  time. She has never undergone baseline bone density. She is accompanied by her son Mitzi Hansen and daughter Kyra Searles today.   HISTORY:   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 02/12/2013   Depression with anxiety 08/30/2013   Diet-controlled diabetes mellitus ([place]) 10/31/2019   Difficulty in walking 08/30/2013   Disorder of sacrum 02/12/2013   Epilepsy ([place]) 08/30/2013   Excessive daytime sleepiness 07/03/2017   Family history of first degree relative with dementia 10/26/2017   Fibromyalgia    Gait abnormality 07/10/2012   Herpes zoster with nervous system complications 5/68/1275   History of seizure disorder 03/17/2019   Hypercholesteremia    Hyperlipidemia 08/30/2013   IBS (irritable bowel syndrome)    Insomnia 09/07/2015   Malignant neoplasm of central portion of left breast in female, estrogen receptor positive ([place]) 10/26/2020   Mass of upper outer quadrant of left breast 05/21/2017   Medication management 01/31/2019   Migraine headache 08/30/2013   Mild cognitive impairment 03/17/2019   Myalgia and myositis 02/12/2013   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/21/2020   Neuropathic pain 07/22/2020   Neuropathy 03/17/2019   Obesity, Class I, BMI 30-34.9 03/17/2019   Obstructive sleep apnea (adult) (pediatric) 08/30/2013   Pain in thoracic spine 02/12/2013   Palpitations 09/23/2019   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   Postlaminectomy syndrome, lumbar region 02/12/2013   Postoperative examination 06/19/2017   Postural hypotension 09/23/2019   Restless legs syndrome 05/22/2017   Risk for falls 09/07/2015   Seizures ([place])    Sleep apnea    nightly cpap   Spondylosis  of thoracic region without myelopathy or radiculopathy 02/12/2013   Thoracic or lumbosacral neuritis or radiculitis 02/12/2013   Thoracic spondylosis with myelopathy 02/12/2013   Tremors of nervous system    Urinary incontinence 03/17/2019   Urinary incontinence, mixed 05/29/2013   Vitamin B 12 deficiency 03/17/2019   Vitamin D deficiency 03/17/2019   Weakness 07/10/2012    Past Surgical History:  Procedure Laterality Date   BLADDER REPAIR  03/13/2018   BLADDER SUSPENSION  03/13/2018   BREAST LUMPECTOMY  2019   COLONOSCOPY  12/20/2012   Left colonic diverticulosis predominantly in the sigmoid colon. Small internal hemmorhoids. Otherwise normal colonoscopy.    LAMINECTOMY  1998    Family History  Problem Relation Age of Onset   Hypercholesterolemia Mother    Alzheimer's disease Mother    Heart Problems Father    Clotting disorder Father    Alzheimer's disease Father    Allergic rhinitis Sister    Uterine cancer Maternal Aunt    Breast cancer Maternal Aunt 60       metastasis to bone and brain   Hemophilia Maternal Aunt    Lung cancer Maternal Uncle 30       smoker   Lung cancer Maternal Uncle        60s   Breast cancer Paternal Aunt 60       mets to bone   Liver cancer Paternal Uncle    Kidney cancer Paternal Uncle    Colon cancer Maternal Grandfather        34s   Brain cancer Paternal Grandfather        50s   Esophageal cancer Neg Hx    Stomach cancer Neg Hx    Rectal cancer Neg Hx   Mother-Osteoporosis  Social History:  reports that she has never smoked. She has never used smokeless tobacco. She reports that she does not drink alcohol and does not use drugs.The patient is accompanied by two of her children today. She is married and lives at home with her spouse. She has 6 children. She is retired, and has been exposed to asbestos for a short time.  Allergies:  Allergies  Allergen Reactions   Nitrofurantoin Anaphylaxis   Sulfa Antibiotics Rash, Other (See Comments) and  Hives    Other reaction(s): Rash     Current Medications: Current Outpatient Medications  Medication Sig Dispense Refill   dicyclomine (BENTYL) 10 MG capsule Take 10 mg by mouth 3 (three) times daily as needed for pain. With IBS     DULoxetine (CYMBALTA) 60 MG capsule Take 60 mg by mouth daily.     EPINEPHrine 0.3 mg/0.3 mL IJ SOAJ injection Inject 0.3 mg into the muscle as needed for anaphylaxis.     ezetimibe (ZETIA) 10 MG tablet Take 10 mg by mouth daily.     fenofibrate 160 MG tablet Take 160 mg by mouth daily.     gabapentin (NEURONTIN) 600 MG tablet Take 1,200 mg by mouth 3 (  three) times daily.     lidocaine (LIDODERM) 5 % Place 1 patch onto the skin as needed for pain.     LINZESS 72 MCG capsule Take 1 capsule (72 mcg total) by mouth daily. 90 capsule 4   metoprolol succinate (TOPROL-XL) 25 MG 24 hr tablet TAKE 1 TABLET BY MOUTH EVERY DAY 90 tablet 2   MYRBETRIQ 50 MG TB24 tablet Take 50 mg by mouth daily.     pramipexole (MIRAPEX) 1 MG tablet Take 0.5 mg by mouth daily.     traMADol (ULTRAM) 50 MG tablet Take 50 mg by mouth 2 (two) times daily as needed for pain.     Vitamin D, Ergocalciferol, (DRISDOL) 1.25 MG (50000 UT) CAPS capsule TAKE 1 CAPSULE BY MOUTH ONCE A WEEK FOR 30 DAYS     No current facility-administered medications for this visit.    REVIEW OF SYSTEMS:  Review of Systems  Constitutional: Negative.  Negative for appetite change, chills, fatigue, fever and unexpected weight change.  HENT:   Positive for tinnitus (constant).   Eyes: Negative.   Respiratory: Negative.  Negative for chest tightness, cough, hemoptysis, shortness of breath and wheezing.   Cardiovascular:  Negative for chest pain, leg swelling and palpitations.       Heart murmur  Gastrointestinal: Negative.  Negative for abdominal distention, abdominal pain, blood in stool, constipation, diarrhea, nausea and vomiting.  Endocrine: Positive for hot flashes.  Genitourinary:  Positive for bladder  incontinence (and bowel incontinence). Negative for difficulty urinating, dysuria, frequency and hematuria.   Musculoskeletal:  Positive for arthralgias, back pain (chronic with degenerative changes), gait problem (unsteady, uses a cane to ambulate) and myalgias (fibromyalgia). Negative for flank pain.  Skin: Negative.   Neurological:  Positive for gait problem (unsteady, uses a cane to ambulate) and seizures (epileptic seizures). Negative for dizziness, extremity weakness, headaches, light-headedness, numbness and speech difficulty.  Hematological: Negative.   Psychiatric/Behavioral: Negative.  Negative for depression and sleep disturbance. The patient is not nervous/anxious.      VITALS:  Blood pressure 124/65, pulse 88, temperature 99.1 F (37.3 C), temperature source Oral, resp. rate 18, height 5' 5" (1.651 m), weight 198 lb 12.8 oz (90.2 kg), SpO2 95 %.  Wt Readings from Last 3 Encounters:  01/19/21 198 lb 12.8 oz (90.2 kg)  11/17/20 198 lb (89.8 kg)  11/04/20 197 lb (89.4 kg)    Body mass index is 33.08 kg/m.  Performance status (ECOG): 1 - Symptomatic but completely ambulatory  PHYSICAL EXAM:  Physical Exam Constitutional:      General: She is not in acute distress.    Appearance: Normal appearance. She is normal weight.  HENT:     Head: Normocephalic and atraumatic.  Eyes:     General: No scleral icterus.    Extraocular Movements: Extraocular movements intact.     Conjunctiva/sclera: Conjunctivae normal.     Pupils: Pupils are equal, round, and reactive to light.  Cardiovascular:     Rate and Rhythm: Normal rate and regular rhythm.     Pulses: Normal pulses.     Heart sounds: Normal heart sounds. No murmur heard.   No friction rub. No gallop.  Pulmonary:     Effort: Pulmonary effort is normal. No respiratory distress.     Breath sounds: Normal breath sounds.  Chest:     Comments: Left mastectomy is negative with slight amount of fluid. Scar of the lateral right  breast. No masses. Abdominal:     General: Bowel sounds are  normal. There is no distension.     Palpations: Abdomen is soft. There is no hepatomegaly, splenomegaly or mass.     Tenderness: There is no abdominal tenderness.  Musculoskeletal:        General: Normal range of motion.     Cervical back: Normal range of motion and neck supple.     Right lower leg: No edema.     Left lower leg: No edema.  Lymphadenopathy:     Cervical: No cervical adenopathy.  Skin:    General: Skin is warm and dry.  Neurological:     General: No focal deficit present.     Mental Status: She is alert and oriented to person, place, and time. Mental status is at baseline.  Psychiatric:        Mood and Affect: Mood normal.        Behavior: Behavior normal.        Thought Content: Thought content normal.        Judgment: Judgment normal.     LABS:  No flowsheet data found. No flowsheet data found.   STUDIES:  No results found.    EXAM: 09/16/2020 DIGITAL SCREENING BILATERAL MAMMOGRAM WITH TOMOSYNTHESIS AND CAD   TECHNIQUE:  Bilateral screening digital craniocaudal and mediolateral oblique  mammograms were obtained. Bilateral screening digital breast  tomosynthesis was performed. The images were evaluated with  computer-aided detection.   COMPARISON: Previous exam(s).   ACR Breast Density Category b: There are scattered areas of  fibroglandular density.   FINDINGS:  In the left breast, a possible mass warrants further evaluation. In  the right breast, no findings suspicious for malignancy.   IMPRESSION:  Further evaluation    EXAM: 10/08/2020 DIGITAL DIAGNOSTIC UNILATERAL LEFT MAMMOGRAM WITH TOMOSYNTHESIS AND  CAD; ULTRASOUND LEFT BREAST   TECHNIQUE:  Left digital diagnostic mammography and breast tomosynthesis was  performed. The images were evaluated with computer-aided detection.;  Targeted ultrasound examination of the left breast was performed.   COMPARISON: Prior films    ACR Breast Density Category b: There are scattered areas of  fibroglandular density.   FINDINGS:  Spot compression cc and MLO views of the left breast are submitted.  There is architectural distortion at the left breast 12 o'clock.  Targeted ultrasound is performed, showing no focal abnormal discrete  cystic or solid lesion at left breast 12 o'clock the correlate to  the mammographic finding. Ultrasound of the left axilla is negative.   IMPRESSION:  Suspicious findings.   SURGICAL PATHOLOGY: 10/20/2020 Breast, left, needle core biopsy, 12 o'clock:  Invasive mammary carcinoma  Mammary carcinoma in situ HER2: Negative (0) ER: Positive 30% PR: Negative 0% Ki67: 1%      I, Rita Ohara, am acting as scribe for [name], MD  I have reviewed this report as typed by the medical scribe, and it is complete and accurate.

## 2021-01-19 ENCOUNTER — Other Ambulatory Visit: Payer: Self-pay

## 2021-01-19 ENCOUNTER — Encounter: Payer: Self-pay | Admitting: Oncology

## 2021-01-19 ENCOUNTER — Other Ambulatory Visit: Payer: Self-pay | Admitting: Oncology

## 2021-01-19 ENCOUNTER — Inpatient Hospital Stay: Payer: Medicare Other | Attending: Oncology | Admitting: Oncology

## 2021-01-19 VITALS — BP 124/65 | HR 88 | Temp 99.1°F | Resp 18 | Ht 65.0 in | Wt 198.8 lb

## 2021-01-19 DIAGNOSIS — E559 Vitamin D deficiency, unspecified: Secondary | ICD-10-CM

## 2021-01-19 DIAGNOSIS — C773 Secondary and unspecified malignant neoplasm of axilla and upper limb lymph nodes: Secondary | ICD-10-CM

## 2021-01-19 DIAGNOSIS — Z170 Estrogen receptor positive status [ER+]: Secondary | ICD-10-CM

## 2021-01-19 DIAGNOSIS — C50112 Malignant neoplasm of central portion of left female breast: Secondary | ICD-10-CM

## 2021-01-19 DIAGNOSIS — Z803 Family history of malignant neoplasm of breast: Secondary | ICD-10-CM

## 2021-01-19 DIAGNOSIS — Z79811 Long term (current) use of aromatase inhibitors: Secondary | ICD-10-CM

## 2021-01-19 DIAGNOSIS — Z808 Family history of malignant neoplasm of other organs or systems: Secondary | ICD-10-CM

## 2021-01-19 DIAGNOSIS — Z8051 Family history of malignant neoplasm of kidney: Secondary | ICD-10-CM

## 2021-01-19 DIAGNOSIS — Z801 Family history of malignant neoplasm of trachea, bronchus and lung: Secondary | ICD-10-CM

## 2021-01-19 DIAGNOSIS — E538 Deficiency of other specified B group vitamins: Secondary | ICD-10-CM

## 2021-01-21 ENCOUNTER — Telehealth: Payer: Self-pay | Admitting: Genetic Counselor

## 2021-01-21 NOTE — Telephone Encounter (Signed)
Received messaged from Brandy Maxwell about Brandy Maxwell needing genetics appt.  Patient preferred video visit.  Scheduled appt for 1/24 at 1pm with Brandy Maxwell.  Confirmed appt date/time with patient's son, Brandy Maxwell.

## 2021-01-25 ENCOUNTER — Inpatient Hospital Stay (HOSPITAL_BASED_OUTPATIENT_CLINIC_OR_DEPARTMENT_OTHER): Payer: Medicare Other | Admitting: Licensed Clinical Social Worker

## 2021-01-25 ENCOUNTER — Encounter: Payer: Self-pay | Admitting: Licensed Clinical Social Worker

## 2021-01-25 DIAGNOSIS — C50112 Malignant neoplasm of central portion of left female breast: Secondary | ICD-10-CM | POA: Diagnosis not present

## 2021-01-25 DIAGNOSIS — Z801 Family history of malignant neoplasm of trachea, bronchus and lung: Secondary | ICD-10-CM | POA: Insufficient documentation

## 2021-01-25 DIAGNOSIS — Z8049 Family history of malignant neoplasm of other genital organs: Secondary | ICD-10-CM | POA: Diagnosis not present

## 2021-01-25 DIAGNOSIS — Z800 Family history of malignant neoplasm of digestive organs: Secondary | ICD-10-CM | POA: Insufficient documentation

## 2021-01-25 DIAGNOSIS — Z803 Family history of malignant neoplasm of breast: Secondary | ICD-10-CM

## 2021-01-25 DIAGNOSIS — Z170 Estrogen receptor positive status [ER+]: Secondary | ICD-10-CM

## 2021-01-25 DIAGNOSIS — Z8042 Family history of malignant neoplasm of prostate: Secondary | ICD-10-CM

## 2021-01-25 NOTE — Progress Notes (Signed)
REFERRING PROVIDER: [name], MD [address] [place],  [place] [address]  PRIMARY PROVIDER:  Enid Skeens., MD  PRIMARY REASON FOR VISIT:  1. Malignant neoplasm of central portion of left breast in female, estrogen receptor positive ([facility])   2. Family history of breast cancer   3. Family history of colon cancer   4. Family history of uterine cancer   5. Family history of lung cancer    I connected with Ms. Stotts on 01/25/2021 at 1:00 PM EDT by MyChart video conference and verified that I am speaking with the correct person using two identifiers.    Patient location: home Provider location: [place]:   Ms. Lengacher, a 66 y.o. female, was seen for a [facility] cancer genetics consultation at the request of Dr. Hinton Rao due to a personal and family history of cancer.  Ms. Proffit presents to clinic today to discuss the possibility of a hereditary predisposition to cancer, genetic testing, and to further clarify her future cancer risks, as well as potential cancer risks for family members.   In 2022, at the age of 19, Ms. Desir was diagnosed with invasive ductal carcinoma of the left breast, ER+, PR-, HER2-. The treatment plan includes left mastectomy which has been completed, remainder of treatment plan is still being determined. Ms. Cincotta does have history of right breast lumpectomy which showed atypical ductal hyperplasia, fibrocystic disease and a complex sclerosing lesion. Ms. Swenor reports having 23 and me testing done that showed a BRCA2 variant.   CANCER HISTORY:  Oncology History  Malignant neoplasm of central portion of left breast in female, estrogen receptor positive ([facility])  10/26/2020 Initial Diagnosis   Malignant neoplasm of central portion of left breast in female, estrogen receptor positive ([facility])   12/17/2020 Cancer Staging   Staging form: Breast, AJCC 8th Edition - Clinical stage from 12/17/2020: Stage IIA (cT1b,  cN1(sn), cM0, G2, ER+, PR-, HER2-) - Signed by [name], MD on 01/19/2021 Histopathologic type: Infiltrating duct carcinoma, NOS Stage prefix: Initial diagnosis Method of lymph node assessment: Sentinel lymph node biopsy Nuclear grade: G2 Multigene prognostic tests performed: EndoPredict Histologic grading system: 3 grade system Laterality: Left Tumor size (mm): 7 Lymph-vascular invasion (LVI): LVI not present (absent)/not identified Diagnostic confirmation: Positive histology Specimen type: Excision Staged by: Managing physician Menopausal status: Postmenopausal Ki-67 (%): 1 Stage used in treatment planning: Yes National guidelines used in treatment planning: Yes Type of national guideline used in treatment planning: NCCN       RISK FACTORS:  Menarche was at age 72.  First live birth at age 69.  OCP use for approximately 0 years.  Ovaries intact: yes.  Hysterectomy: no.  Menopausal status: postmenopausal.  Colonoscopy: yes; normal.  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 02/12/2013   Depression with anxiety 08/30/2013   Diet-controlled diabetes mellitus ([facility]) 10/31/2019   Difficulty in walking 08/30/2013   Disorder of sacrum 02/12/2013   Epilepsy ([facility]) 08/30/2013   Excessive daytime sleepiness 07/03/2017   Family history of breast cancer    Family history of colon cancer    Family history of  first degree relative with dementia 10/26/2017   Family history of lung cancer    Family history of uterine cancer    Fibromyalgia    Gait abnormality 07/10/2012   Herpes zoster with nervous system complications  02/12/2013   History of seizure disorder 03/17/2019   Hypercholesteremia    Hyperlipidemia 08/30/2013   IBS (irritable bowel syndrome)    Insomnia 09/07/2015   Malignant neoplasm of central portion of left breast in female, estrogen receptor positive ([facility]) 10/26/2020   Mass of upper outer quadrant of left breast 05/21/2017   Medication management 01/31/2019   Migraine headache 08/30/2013   Mild cognitive impairment 03/17/2019   Myalgia and myositis 02/12/2013   Neurogenic bladder 05/21/2020   Neuropathic pain 07/22/2020   Neuropathy 03/17/2019   Obesity, Class I, BMI 30-34.9 03/17/2019   Obstructive sleep apnea (adult) (pediatric) 08/30/2013   Pain in thoracic spine 02/12/2013   Palpitations 09/23/2019   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   Postlaminectomy syndrome, lumbar region 02/12/2013   Postoperative examination 06/19/2017   Postural hypotension 09/23/2019   Restless legs syndrome 05/22/2017   Risk for falls 09/07/2015   Seizures ([facility])    Sleep apnea    nightly cpap   Spondylosis of thoracic region without myelopathy or radiculopathy 02/12/2013   Thoracic or lumbosacral neuritis or radiculitis 02/12/2013   Thoracic spondylosis with myelopathy 02/12/2013   Tremors of nervous system    Urinary incontinence 03/17/2019   Urinary incontinence, mixed 05/29/2013   Vitamin B 12 deficiency 03/17/2019   Vitamin D deficiency 03/17/2019   Weakness 07/10/2012    Past Surgical History:  Procedure Laterality Date   BLADDER REPAIR  03/13/2018   BLADDER SUSPENSION  03/13/2018   BREAST LUMPECTOMY  2019   COLONOSCOPY  12/20/2012   Left colonic diverticulosis predominantly in the sigmoid colon. Small internal hemmorhoids. Otherwise normal colonoscopy.    LAMINECTOMY  1998    Social History   Socioeconomic History   Marital status: Married    Spouse name: Not on file   Number of children: Not on file   Years of education: Not on file   Highest education level: Not  on file  Occupational History   Not on file  Tobacco Use   Smoking status: Never   Smokeless tobacco: Never  Vaping Use   Vaping Use: Never used  Substance and Sexual Activity   Alcohol use: Never   Drug use: Never   Sexual activity: Not on file  Other Topics Concern   Not on file  Social History Narrative   Not on file   Social Determinants of Health   Financial Resource Strain: Not on file  Food Insecurity: Not on file  Transportation Needs: Not on file  Physical Activity: Not on file  Stress: Not on file  Social Connections: Not on file     FAMILY HISTORY:  We obtained a detailed, 4-generation family history.  Significant diagnoses are listed below: Family History  Problem Relation Age of Onset   Hypercholesterolemia Mother    Alzheimer's disease Mother    Heart Problems Father    Clotting disorder Father    Alzheimer's disease Father    Allergic rhinitis Sister    Uterine cancer Maternal Aunt    Breast cancer Maternal Aunt 60       metastasis to bone and brain   Hemophilia Maternal Aunt    Lung cancer Maternal Uncle 50       smoker   Lung cancer Maternal Uncle  42s   Breast cancer Paternal Aunt 18       mets to bone   Liver cancer Paternal Uncle    Kidney cancer Paternal Uncle    Colon cancer Maternal Grandfather        58s   Brain cancer Paternal Grandfather        44s   Esophageal cancer Neg Hx    Stomach cancer Neg Hx    Rectal cancer Neg Hx    Ms. Bredeson has 5 sons, 1 daughter, no cancers. She has 2 sisters and 1 brother, no cancer.  Ms. Latka mother died at 88 of kidney disease. Patient had 4 maternal aunts, 4 maternal uncles. One aunt had breast cancer in her 41s and died at 6 after recurrence. Another aunt had breast cancer in her 78s and died in her 67s. An aunt had uterine cancer in her 16s-50s and died at 37, she had a daughter that has colon cancer. Two maternal uncles had lung cancer, both had history of smoking. A cousin had brain  cancer and died at 6, and another cousin had bone cancer and passed from it. Maternal grandmother died at 52, grandfather died at 40 and had colon cancer in his 57s.   Ms. Rosner father had prostate cancer in his 66s and died at 14. Patient had 2 paternal aunts, 2 paternal uncles. One aunt had breast cancer in her 36s, three recurrences, and died at 43. Her daughter had breast cancer in her 20s and had bilateral mastectomy, unknown if she had genetic testing. A paternal uncle had liver/kidney cancer and died at 74. Paternal grandfather had brain cancer in his 64s and died at 7. Grandmother died at 79.  Ms. Birt is unaware of previous family history of genetic testing for hereditary cancer risks. Patient's maternal ancestors are of [place], [place], [place] descent, and paternal ancestors are of [place], [place] descent. There is reported Ashkenazi Jewish ancestry, small percentage on 23 and me/ancestry testing. There is no known consanguinity.    GENETIC COUNSELING ASSESSMENT: Ms. Caillouet is a 66 y.o. female with a personal and family history which is somewhat suggestive of a hereditary cancer syndrome and predisposition to cancer. We, therefore, discussed and recommended the following at today's visit.   DISCUSSION: We discussed that approximately 10% of breast cancer is hereditary. Most cases of hereditary breast cancer are associated with BRCA1/BRCA2 genes, although there are other genes associated with hereditary cancer as well. Cancers and risks are gene specific.  We discussed BRCA2 specifically since this came up on her 99 and me testing, and we recommend confirming that variant with clinical testing. We discussed that testing is beneficial for several reasons including knowing about other cancer risks, identifying potential screening and risk-reduction options that may be appropriate, and to understand if other family members could be at risk for cancer and allow them to undergo genetic  testing.   We reviewed the characteristics, features and inheritance patterns of hereditary cancer syndromes. We also discussed genetic testing, including the appropriate family members to test, the process of testing, insurance coverage and turn-around-time for results. We discussed the implications of a negative, positive and/or variant of uncertain significant result. We recommended Ms. Hurwitz pursue genetic testing for the [facility] Multi-Cancer+RNA gene panel.   The Multi-Cancer Panel + RNA offered by [facility] includes sequencing and/or deletion duplication testing of the following 84 genes: AIP, ALK, APC, ATM, AXIN2,BAP1,  BARD1, BLM, BMPR1A, BRCA1, BRCA2, BRIP1, CASR, CDC73, CDH1, CDK4, CDKN1B, CDKN1C, CDKN2A (  p14ARF), CDKN2A (p16INK4a), CEBPA, CHEK2, CTNNA1, DICER1, DIS3L2, EGFR (c.2369C>T, p.Thr790Met variant only), EPCAM (Deletion/duplication testing only), FH, FLCN, GATA2, GPC3, GREM1 (Promoter region deletion/duplication testing only), HOXB13 (c.251G>A, p.Gly84Glu), HRAS, KIT, MAX, MEN1, MET, MITF (c.952G>A, p.Glu318Lys variant only), MLH1, MSH2, MSH3, MSH6, MUTYH, NBN, NF1, NF2, NTHL1, PALB2, PDGFRA, PHOX2B, PMS2, POLD1, POLE, POT1, PRKAR1A, PTCH1, PTEN, RAD50, RAD51C, RAD51D, RB1, RECQL4, RET, RUNX1, SDHAF2, SDHA (sequence changes only), SDHB, SDHC, SDHD, SMAD4, SMARCA4, SMARCB1, SMARCE1, STK11, SUFU, TERC, TERT, TMEM127, TP53, TSC1, TSC2, VHL, WRN and WT1.  Based on Ms. Beverlin's personal and family history of cancer, she meets medical criteria for genetic testing. Despite that she meets criteria, she may still have an out of pocket cost. We discussed that if her out of pocket cost for testing is over $100, the laboratory will call and confirm whether she wants to proceed with testing.  If the out of pocket cost of testing is less than $100 she will be billed by the genetic testing laboratory.   PLAN: After considering the risks, benefits, and limitations, Ms. Hemann provided informed consent to  pursue genetic testing. She will have blood drawn on 2/2 and the blood sample will be sent to [facility] for analysis of the Multi-Cancer+RNA panel. Results should be available within approximately 2-3 weeks' time, at which point they will be disclosed by telephone to Ms. Mcnair, as will any additional recommendations warranted by these results. Ms. Dlouhy will receive a summary of her genetic counseling visit and a copy of her results once available. This information will also be available in Epic.   Ms. Sulton questions were answered to her satisfaction today. Our contact information was provided should additional questions or concerns arise. Thank you for the referral and allowing [place] to share in the care of your patient.   Faith Rogue, MS, [facility] Genetic Counselor [email or web address] Phone: [phone]  The patient was seen for a total of 35 minutes in virtual genetic counseling.  Patient was seen alone. Dr. Grayland Ormond was available for discussion regarding this case.   _______________________________________________________________________ For Office Staff:  Number of people involved in session: 1 Was an Intern/ student involved with case: no

## 2021-01-27 ENCOUNTER — Other Ambulatory Visit: Payer: Self-pay | Admitting: Oncology

## 2021-01-28 ENCOUNTER — Telehealth: Payer: Self-pay

## 2021-01-28 NOTE — Telephone Encounter (Signed)
-----   Message from [name], MD sent at 01/27/2021  2:19 PM EST ----- Regarding: call Tell her no sign of spread of cancer.  She just has a fluid collection at the surgery site, a seroma

## 2021-01-28 NOTE — Progress Notes (Signed)
[place]  [address] [place],  [place]  [address] [phone]  Clinic Day:  02/03/2021  Referring physician: Enid Skeens., MD  This document serves as a record of services personally performed by Hosie Poisson, MD. It was created on their behalf by [facility], a trained medical scribe. The creation of this record is based on the scribe's personal observations and the provider's statements to them.  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 She has undergone left mastectomy but one node was positive for malignancy. I definitely recommend hormonal therapy. I am concerned about her risk for recurrence in view of the positive node, grade 2 histology, and the low level of estrogen receptors with negative progesterone receptors. EndoPredict has revealed her to be extremely high risk with an EP clin score of 5.4, and so I recommend chemotherapy with AC every 3 weeks followed by weekly Taxol. Her risk of recurrence in the next 10 years is 55% and she has an estimated chemotherapy benefit of 27%.  Significant family history of multiple malignancies as well as thromboses. I will refer to the genetic counselor to discuss genetic testing.   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   I have recommended chemotherapy with AC every 3 weeks followed by weekly Taxol. She is hesitant about the Taxol portion and we can decide on this later. I will schedule her for a baseline ECHO with Dr. Geraldo Pitter so that he can compare to any prior studies. We will contact her surgeon to place a port. We will schedule her for chemo-education and then decide when to start treatment. Even though her estrogen receptor was low positive, she would still likely benefit from  endocrine therapy with an aromatase inhibitor such as anastrozole for a total of 5-10 years, after chemotherapy is completed. We will schedule her for baseline bone density. She and her family understand and agree with this plan of care. I have answered her questions and she knows to call with any concerns.  I provided 30 minutes of face-to-face time during this this encounter and > 50% was spent counseling as documented under my assessment and plan.    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 female referred by Dr. Jerel Shepherd for the evaluation and treatment of breast cancer. This was found on screening bilateral mammogram from September 2022 which revealed a possible mass within the left breast. Right breast is without masses. Diagnostic unilateral left mammogram confirmed an architectural distortion at the left breast 12 o'clock. Biopsy was pursed in October and surgical pathology from this procedure revealed invasive mammary carcinoma and mammary carcinoma in situ. HER2 was negative (0). Estrogen receptor was positive at 30% and progesterone receptor was negative at 0%. Ki67 was 1%. She was referred to Dr. Cipriano Mile and underwent left mastectomy and sentinel lymph node biopsy on December 16th. Final pathology confirmed invasive ductal carcinoma, grade 2, measuring 7 mm, and ductal carcinoma in  situ. All margins were negative. One sentinel lymph nodes was positive for malignancy (1/1) for a T1b N1a M0, stage IIA. Blood counts and chemistries were unremarkable.  INTERVAL HISTORY:  I have reviewed her chart and materials related to her cancer extensively  and collaborated history with the patient. Summary of oncologic history is as follows: Oncology History  Malignant neoplasm of central portion of left breast in female, estrogen receptor positive ([place])  09/16/2020 Mammogram   DIGITAL SCREENING BILATERAL MAMMOGRAM: In the left breast, a possible mass warrants further evaluation. In the right breast, no findings suspicious for malignancy.    10/08/2020 Mammogram   DIAGNOSTIC UNILATERAL LEFT MAMMOGRAM AND LEFT BREAST ULTRASOUND: There is architectural distortion at the left breast 12 o'clock.  Targeted ultrasound is performed, showing no focal abnormal discrete cystic or solid lesion at left breast 12 o'clock the correlate to the mammographic finding. Ultrasound of the left axilla is negative.   10/20/2020 Pathology Results   Breast, left, needle core biopsy, 12 o'clock:             Invasive mammary carcinoma             Mammary carcinoma in situ HER2: Negative (0) ER: Positive 30% PR: Negative 0% Ki67: 1%   10/26/2020 Initial Diagnosis   Malignant neoplasm of central portion of left breast in female, estrogen receptor positive ([place])   12/17/2020 Cancer Staging   Staging form: Breast, AJCC 8th Edition - Clinical stage from 12/17/2020: Stage IIA (cT1b, cN1(sn), cM0, G2, ER+, PR-, HER2-) - Signed by [name], MD on 01/19/2021 Histopathologic type: Infiltrating duct carcinoma, NOS Stage prefix: Initial diagnosis Method of lymph node assessment: Sentinel lymph node biopsy Nuclear grade: G2 Multigene prognostic tests performed: EndoPredict Histologic grading system: 3 grade system Laterality: Left Tumor size (mm): 7 Lymph-vascular invasion (LVI): LVI not present (absent)/not identified Diagnostic confirmation: Positive histology Specimen type: Excision Staged by: Managing physician Menopausal status: Postmenopausal Ki-67 (%): 1 Stage used in treatment planning: Yes National guidelines used in treatment planning: Yes Type of  national guideline used in treatment planning: NCCN    12/17/2020 Pathology Results   Breast, simple mastectomy, left:         Invasive ductal carcinoma, grade 2, 7 mm, and ductal carcinoma in situ         Ribbon clip and biopsy reaction present         All margins negative for invasive carcinoma and DCIS         Fibrocystic changes Lymph node, sentinel, biopsy, left axillary:         Tumor present in regional lymph node (1/1)     Brandy Maxwell states that she is healing well from her procedure. She has continued annual mammography for the past 8 years. She did have atypical ductal hyperplasia within a complex sclerosing lesion of the right breast in 2019. She has arthralgias and back pain from degenerative changes. She also has been diagnosed with fibromyalgia. She reports constant tinnitus. 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 She has occasional tremor. She is unsteady on her feet and so uses a cane to ambulate. She does have occasions of urinary incontinence as well as bowel incontinence. She is vitamin D deficient and continues oral supplement 50,000 units weekly. She is also deficient in B12 and continues injections. Of note, she has a significant family history of multiple malignancies as well as thromboses. She states that they have not undergone  genetic testing. She has met with Dr. Orlene Erm today, and adjuvant radiation therapy will not be required. 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 She started menarche at age 45. She gave birth to her 1st child at age 85 and her last child at age 47. She went through menopause in her 30s. She was on hormone replacement therapy for a short time. She has never undergone baseline bone density. She was accompanied by her son Mitzi Hansen and daughter Kyra Searles at her first appointment, and I recommended Endopredict testing in view of the positive lymph node.   Brandy Maxwell is here  for follow up to review recent imaging and genomic results. CT chest, abdomen and pelvis from January 25th revealed status post left mastectomy. There is a fluid attenuation collection in the left mastectomy bed measuring 6.5 x 2.6 cm, likely a postoperative seroma. No evidence of lymphadenopathy or metastatic disease in the chest, abdomen, or pelvis. Hepatic steatosis. Benign, calcified granulomatous mediastinal lymph nodes and pulmonary nodules. EndoPredict testing revealed and EPclin score of 5.4, which is considered high risk. Her risk for distant recurrence in the next 10 years with endocrine therapy alone is 55%, and she would gain an additional 27% benefit from the addition of chemotherapy. Her risk for late distant recurrence in the next 15 years is 43%. I have therefore recommended chemotherapy and we discussed various regimens. My recommendation would be AC for 3 months follow by Taxol for 3 months. She is very concerned as she already has significant baseline neuropathy, at least grade 3. She also has seen her urologist today and will be arranging for Botox injections of the urethra. She has seen Dr. Geraldo Pitter regarding a heart murmur in the past, so we will request he check an Echocardiogram prior to Adriamycin chemotherapy. CBC and CMP are unremarkable except for a non-fasting blood sugar of 196.  Her  appetite is good.  She denies fever, chills or other signs of infection.  She denies nausea, vomiting, bowel issues, or abdominal pain.  She denies sore throat, cough, dyspnea, or chest pain. She is accompanied by her daughter today.  HISTORY:   Allergies:  Allergies  Allergen Reactions   Nitrofurantoin Anaphylaxis   Sulfa Antibiotics Rash, Other (See Comments) and Hives    Other reaction(s): Rash     Current Medications: Current Outpatient Medications  Medication Sig Dispense Refill   dicyclomine (BENTYL) 10 MG capsule Take 10 mg by mouth 3 (three) times daily as needed for pain. With IBS      DULoxetine (CYMBALTA) 60 MG capsule Take 60 mg by mouth daily.     EPINEPHrine 0.3 mg/0.3 mL IJ SOAJ injection Inject 0.3 mg into the muscle as needed for anaphylaxis.     ezetimibe (ZETIA) 10 MG tablet Take 10 mg by mouth daily.     fenofibrate 160 MG tablet Take 160 mg by mouth daily.     gabapentin (NEURONTIN) 600 MG tablet Take 1,200 mg by mouth 3 (three) times daily.     lidocaine (LIDODERM) 5 % Place 1 patch onto the skin as needed for pain.     LINZESS 72 MCG capsule Take 1 capsule (72 mcg total) by mouth daily. 90 capsule 4   metoprolol succinate (TOPROL-XL) 25 MG 24 hr tablet TAKE 1 TABLET BY MOUTH EVERY DAY 90 tablet 2   MYRBETRIQ 50 MG TB24 tablet Take 50 mg by mouth daily.     pramipexole (MIRAPEX) 1 MG tablet  Take 0.5 mg by mouth daily.     traMADol (ULTRAM) 50 MG tablet Take 50 mg by mouth 2 (two) times daily as needed for pain.     Vitamin D, Ergocalciferol, (DRISDOL) 1.25 MG (50000 UT) CAPS capsule TAKE 1 CAPSULE BY MOUTH ONCE A WEEK FOR 30 DAYS     No current facility-administered medications for this visit.    REVIEW OF SYSTEMS:  Review of Systems  Constitutional: Negative.  Negative for appetite change, chills, fatigue, fever and unexpected weight change.  HENT:   Negative for tinnitus.   Eyes: Negative.   Respiratory: Negative.  Negative for chest tightness, cough, hemoptysis, shortness of breath and wheezing.   Cardiovascular:  Negative for chest pain, leg swelling and palpitations.       Heart murmur  Gastrointestinal: Negative.  Negative for abdominal distention, abdominal pain, blood in stool, constipation, diarrhea, nausea and vomiting.  Endocrine: Negative for hot flashes.  Genitourinary:  Positive for bladder incontinence (and bowel incontinence). Negative for difficulty urinating, dysuria, frequency and hematuria.   Musculoskeletal:  Positive for arthralgias, back pain (chronic with degenerative changes), gait problem (unsteady, uses a cane to ambulate) and  myalgias (fibromyalgia). Negative for flank pain.  Skin: Negative.   Neurological:  Positive for gait problem (unsteady, uses a cane to ambulate) and seizures (Hx of epileptic seizures). Negative for dizziness, extremity weakness, headaches, light-headedness, numbness and speech difficulty.  Hematological: Negative.   Psychiatric/Behavioral: Negative.  Negative for depression and sleep disturbance. The patient is not nervous/anxious.      VITALS:  Blood pressure (!) 107/59, pulse (!) 104, temperature 98.5 F (36.9 C), temperature source Oral, resp. rate 18, height $RemoveBe'5\' 5"'omIhvucPk$  (1.651 m), weight 198 lb 3.2 oz (89.9 kg), SpO2 97 %.  Wt Readings from Last 3 Encounters:  02/03/21 198 lb 3.2 oz (89.9 kg)  01/19/21 198 lb 12.8 oz (90.2 kg)  11/17/20 198 lb (89.8 kg)    Body mass index is 32.98 kg/m.  Performance status (ECOG): 1 - Symptomatic but completely ambulatory  PHYSICAL EXAM:  Physical Exam Constitutional:      General: She is not in acute distress.    Appearance: Normal appearance. She is normal weight.  HENT:     Head: Normocephalic and atraumatic.  Eyes:     General: No scleral icterus.    Extraocular Movements: Extraocular movements intact.     Conjunctiva/sclera: Conjunctivae normal.     Pupils: Pupils are equal, round, and reactive to light.  Cardiovascular:     Rate and Rhythm: Regular rhythm. Tachycardia present.     Pulses: Normal pulses.     Heart sounds: Normal heart sounds. No murmur heard.   No friction rub. No gallop.  Pulmonary:     Effort: Pulmonary effort is normal. No respiratory distress.     Breath sounds: Normal breath sounds.  Abdominal:     General: Bowel sounds are normal. There is no distension.     Palpations: Abdomen is soft. There is no hepatomegaly, splenomegaly or mass.     Tenderness: There is no abdominal tenderness.  Musculoskeletal:        General: Normal range of motion.     Cervical back: Normal range of motion and neck supple.     Right  lower leg: No edema.     Left lower leg: No edema.  Lymphadenopathy:     Cervical: No cervical adenopathy.  Skin:    General: Skin is warm and dry.  Neurological:  General: No focal deficit present.     Mental Status: She is alert and oriented to person, place, and time. Mental status is at baseline.  Psychiatric:        Mood and Affect: Mood normal.        Behavior: Behavior normal.        Thought Content: Thought content normal.        Judgment: Judgment normal.     LABS:   CBC Latest Ref Rng & Units 02/03/2021  WBC - 6.3  Hemoglobin 12.0 - 16.0 15.6  Hematocrit 36 - 46 45  Platelets 150 - 399 286   CMP Latest Ref Rng & Units 02/03/2021  BUN 4 - 21 16  Creatinine 0.5 - 1.1 0.9  Sodium 137 - 147 138  Potassium 3.4 - 5.3 4.4  Chloride 99 - 108 104  CO2 13 - 22 26(A)  Calcium 8.7 - 10.7 9.5  Alkaline Phos 25 - 125 50  AST 13 - 35 43(A)  ALT 7 - 35 41(A)     STUDIES:  No results found.    EXAM: 01/26/2021 CT CHEST, ABDOMEN, AND PELVIS WITH CONTRAST   TECHNIQUE:  Multidetector CT imaging of the chest, abdomen and pelvis was  performed following the standard protocol during bolus  administration of intravenous contrast.   RADIATION DOSE REDUCTION: This exam was performed according to the  departmental dose-optimization program which includes automated  exposure control, adjustment of the mA and/or kV according to  patient size and/or use of iterative reconstruction technique.   CONTRAST: 100 mL Isovue 370 iodinated contrast IV, additional oral  enteric contrast   COMPARISON: MR breast, 11/22/2018   FINDINGS:  CT CHEST FINDINGS  Cardiovascular: No significant vascular findings. Normal heart size.  No pericardial effusion.  Mediastinum/Nodes: No enlarged mediastinal, hilar, or axillary lymph  nodes. Small calcified mediastinal and bilateral hilar lymph nodes.  Thyroid gland, trachea, and esophagus demonstrate no significant  findings.  Lungs/Pleura:  Multiple small, benign bilateral calcified pulmonary  nodules. Mild, predominantly bandlike scarring of the bilateral lung  bases. No pleural effusion or pneumothorax.  Musculoskeletal: No chest wall mass or suspicious osseous lesions  identified. Status post left mastectomy. There is a fluid  attenuation collection in the left mastectomy bed measuring 6.5 x  2.6 cm, likely a seroma (series 2, image 34).   CT ABDOMEN PELVIS FINDINGS  Hepatobiliary: No solid liver abnormality is seen. Hepatic  steatosis. No gallstones, gallbladder wall thickening, or biliary  dilatation.  Pancreas: Unremarkable. No pancreatic ductal dilatation or  surrounding inflammatory changes.  Spleen: Normal in size without significant abnormality.  Adrenals/Urinary Tract: Adrenal glands are unremarkable. Kidneys are  normal, without renal calculi, solid lesion, or hydronephrosis.  Bladder is unremarkable.  Stomach/Bowel: Stomach is within normal limits. Appendix appears  normal. No evidence of bowel wall thickening, distention, or  inflammatory changes. Sigmoid diverticula.  Vascular/Lymphatic: Scattered aortic atherosclerosis. No enlarged  abdominal or pelvic lymph nodes.  Reproductive: No mass or other abnormality.  Other: No abdominal wall hernia or abnormality. No ascites.  Musculoskeletal: No acute osseous findings.   IMPRESSION:  1. Status post left mastectomy. There is a fluid attenuation  collection in the left mastectomy bed measuring 6.5 x 2.6 cm, likely  a postoperative seroma. The presence or absence of infection in a  postoperative fluid collection is not established by CT.  2. No evidence of lymphadenopathy or metastatic disease in the  chest, abdomen, or pelvis.  3. Hepatic steatosis.  4. Benign, calcified granulomatous mediastinal lymph nodes and  pulmonary nodules, for which no further follow-up or  characterization is specifically indicated.  Aortic Atherosclerosis (ICD10-I70.0).    I,  Rita Ohara, am acting as scribe for [name], MD  I have reviewed this report as typed by the medical scribe, and it is complete and accurate.

## 2021-01-28 NOTE — Telephone Encounter (Signed)
Patient notified

## 2021-02-01 ENCOUNTER — Encounter: Payer: Self-pay | Admitting: Oncology

## 2021-02-02 ENCOUNTER — Telehealth: Payer: Self-pay

## 2021-02-02 NOTE — Telephone Encounter (Signed)
Endopredict is pending per Rosanne Sack PA-C

## 2021-02-02 NOTE — Telephone Encounter (Signed)
-----   Message from [name], MD sent at 01/26/2021  7:31 AM EST ----- Regarding: lab appt She returns in 1 week for Endopredict results on 2/2, is that in process? She is down for lab appt that day, I wonder why?  Labs done in Dec were all normal

## 2021-02-03 ENCOUNTER — Inpatient Hospital Stay: Payer: Medicare Other | Attending: Oncology | Admitting: Oncology

## 2021-02-03 ENCOUNTER — Other Ambulatory Visit: Payer: Self-pay | Admitting: Oncology

## 2021-02-03 ENCOUNTER — Telehealth: Payer: Self-pay | Admitting: Oncology

## 2021-02-03 ENCOUNTER — Other Ambulatory Visit: Payer: Self-pay

## 2021-02-03 ENCOUNTER — Encounter: Payer: Self-pay | Admitting: Oncology

## 2021-02-03 ENCOUNTER — Other Ambulatory Visit: Payer: Self-pay | Admitting: Hematology and Oncology

## 2021-02-03 ENCOUNTER — Inpatient Hospital Stay: Payer: Medicare Other

## 2021-02-03 VITALS — BP 107/59 | HR 104 | Temp 98.5°F | Resp 18 | Ht 65.0 in | Wt 198.2 lb

## 2021-02-03 DIAGNOSIS — Z170 Estrogen receptor positive status [ER+]: Secondary | ICD-10-CM | POA: Insufficient documentation

## 2021-02-03 DIAGNOSIS — Z5111 Encounter for antineoplastic chemotherapy: Secondary | ICD-10-CM | POA: Insufficient documentation

## 2021-02-03 DIAGNOSIS — C50112 Malignant neoplasm of central portion of left female breast: Secondary | ICD-10-CM

## 2021-02-03 DIAGNOSIS — C773 Secondary and unspecified malignant neoplasm of axilla and upper limb lymph nodes: Secondary | ICD-10-CM | POA: Insufficient documentation

## 2021-02-03 DIAGNOSIS — Z5189 Encounter for other specified aftercare: Secondary | ICD-10-CM | POA: Insufficient documentation

## 2021-02-03 DIAGNOSIS — Z0189 Encounter for other specified special examinations: Secondary | ICD-10-CM

## 2021-02-03 LAB — CBC AND DIFFERENTIAL
HCT: 45 (ref 36–46)
Hemoglobin: 15.6 (ref 12.0–16.0)
Neutrophils Absolute: 3.47
Platelets: 286 (ref 150–399)
WBC: 6.3

## 2021-02-03 LAB — COMPREHENSIVE METABOLIC PANEL
Albumin: 4.2 (ref 3.5–5.0)
Calcium: 9.5 (ref 8.7–10.7)

## 2021-02-03 LAB — BASIC METABOLIC PANEL
BUN: 16 (ref 4–21)
CO2: 26 — AB (ref 13–22)
Chloride: 104 (ref 99–108)
Creatinine: 0.9 (ref 0.5–1.1)
Glucose: 196
Potassium: 4.4 (ref 3.4–5.3)
Sodium: 138 (ref 137–147)

## 2021-02-03 LAB — CBC
MCV: 94 (ref 81–99)
RBC: 4.75 (ref 3.87–5.11)

## 2021-02-03 LAB — HEPATIC FUNCTION PANEL
ALT: 41 — AB (ref 7–35)
AST: 43 — AB (ref 13–35)
Alkaline Phosphatase: 50 (ref 25–125)
Bilirubin, Total: 0.6

## 2021-02-03 NOTE — Telephone Encounter (Unsigned)
02/03/21 Spoke with patients son and gave appt time for Echo at [place]

## 2021-02-08 ENCOUNTER — Other Ambulatory Visit: Payer: Self-pay

## 2021-02-08 DIAGNOSIS — C50112 Malignant neoplasm of central portion of left female breast: Secondary | ICD-10-CM

## 2021-02-09 ENCOUNTER — Other Ambulatory Visit: Payer: Self-pay

## 2021-02-09 ENCOUNTER — Ambulatory Visit: Payer: Medicare Other | Admitting: Hematology and Oncology

## 2021-02-09 ENCOUNTER — Ambulatory Visit (INDEPENDENT_AMBULATORY_CARE_PROVIDER_SITE_OTHER): Payer: Medicare Other

## 2021-02-09 ENCOUNTER — Other Ambulatory Visit: Payer: Self-pay | Admitting: Oncology

## 2021-02-09 DIAGNOSIS — C50112 Malignant neoplasm of central portion of left female breast: Secondary | ICD-10-CM

## 2021-02-09 DIAGNOSIS — Z170 Estrogen receptor positive status [ER+]: Secondary | ICD-10-CM | POA: Diagnosis not present

## 2021-02-09 DIAGNOSIS — Z0189 Encounter for other specified special examinations: Secondary | ICD-10-CM | POA: Diagnosis not present

## 2021-02-09 LAB — ECHOCARDIOGRAM COMPLETE: S' Lateral: 2.4 cm

## 2021-02-09 NOTE — Progress Notes (Signed)
START ON PATHWAY REGIMEN - Breast     Cycles 1 through 4: A cycle is every 21 days:     Doxorubicin      Cyclophosphamide    Cycles 5 through 16: A cycle is every 7 days:     Paclitaxel   **Always confirm dose/schedule in your pharmacy ordering system**  Patient Characteristics: Postoperative without Neoadjuvant Therapy (Pathologic Staging), Invasive Disease, Adjuvant Therapy, HER2 Negative/Unknown/Equivocal, ER Positive, Node Positive, Node Positive (1-3), EndoPredict(R), EPClin High Risk  (3.4 - 8.2) Therapeutic Status: Postoperative without Neoadjuvant Therapy (Pathologic Staging) AJCC Grade: G2 AJCC N Category: pN1 AJCC M Category: cM0 ER Status: Positive (+) AJCC 8 Stage Grouping: IIA HER2 Status: Negative (-) Oncotype Dx Recurrence Score: Ordered Other Genomic Test AJCC T Category: pT1b PR Status: Negative (-) Adjuvant Therapy Status: No Adjuvant Therapy Received Yet or Changing Initial Adjuvant Regimen due to Tolerance Has this patient completed genomic testing<= Yes - EndoPredict(R) EPClin Risk Score: EPClin High Risk (3.4 - 8.2) Intent of Therapy: Curative Intent, Discussed with Patient

## 2021-02-10 ENCOUNTER — Encounter: Payer: Self-pay | Admitting: Hematology and Oncology

## 2021-02-10 ENCOUNTER — Inpatient Hospital Stay (INDEPENDENT_AMBULATORY_CARE_PROVIDER_SITE_OTHER): Payer: Medicare Other | Admitting: Hematology and Oncology

## 2021-02-10 ENCOUNTER — Encounter: Payer: Self-pay | Admitting: Oncology

## 2021-02-10 VITALS — BP 119/58 | HR 98 | Temp 98.1°F | Resp 20 | Ht 65.0 in | Wt 197.7 lb

## 2021-02-10 DIAGNOSIS — C50112 Malignant neoplasm of central portion of left female breast: Secondary | ICD-10-CM

## 2021-02-10 DIAGNOSIS — Z170 Estrogen receptor positive status [ER+]: Secondary | ICD-10-CM | POA: Diagnosis not present

## 2021-02-10 DIAGNOSIS — Z780 Asymptomatic menopausal state: Secondary | ICD-10-CM | POA: Insufficient documentation

## 2021-02-10 DIAGNOSIS — M8580 Other specified disorders of bone density and structure, unspecified site: Secondary | ICD-10-CM

## 2021-02-10 HISTORY — DX: Asymptomatic menopausal state: Z78.0

## 2021-02-10 HISTORY — DX: Other specified disorders of bone density and structure, unspecified site: M85.80

## 2021-02-10 MED ORDER — DEXAMETHASONE 4 MG PO TABS: 8.0000 mg | ORAL_TABLET | Freq: Every day | ORAL | 1 refills | Status: DC

## 2021-02-10 MED ORDER — PROCHLORPERAZINE MALEATE 10 MG PO TABS: 10.0000 mg | ORAL_TABLET | Freq: Four times a day (QID) | ORAL | 1 refills | Status: DC | PRN

## 2021-02-10 MED ORDER — ONDANSETRON HCL 8 MG PO TABS: 8.0000 mg | ORAL_TABLET | Freq: Two times a day (BID) | ORAL | 1 refills | Status: DC | PRN

## 2021-02-10 NOTE — Progress Notes (Signed)
[facility] NOTE  Patient Care Team: Enid Skeens., MD as PCP - General (Family Medicine) Revankar, Reita Cliche, MD as PCP - Cardiology (Cardiology)   Name of the patient: Brandy Maxwell  [id number]  05/18/1955   Date of visit: 02/10/21  Diagnosis- Breast Cancer  Chief complaint/Reason for visit- Initial Meeting for [facility], preparing for starting chemotherapy  Heme/Onc history:  Oncology History  Malignant neoplasm of central portion of left breast in female, estrogen receptor positive ([place])  09/16/2020 Mammogram   DIGITAL SCREENING BILATERAL MAMMOGRAM: In the left breast, a possible mass warrants further evaluation. In the right breast, no findings suspicious for malignancy.    10/08/2020 Mammogram   DIAGNOSTIC UNILATERAL LEFT MAMMOGRAM AND LEFT BREAST ULTRASOUND: There is architectural distortion at the left breast 12 o'clock.  Targeted ultrasound is performed, showing no focal abnormal discrete cystic or solid lesion at left breast 12 o'clock the correlate to the mammographic finding. Ultrasound of the left axilla is negative.   10/20/2020 Pathology Results   Breast, left, needle core biopsy, 12 o'clock:             Invasive mammary carcinoma             Mammary carcinoma in situ HER2: Negative (0) ER: Positive 30% PR: Negative 0% Ki67: 1%   10/26/2020 Initial Diagnosis   Malignant neoplasm of central portion of left breast in female, estrogen receptor positive ([place])   12/17/2020 Cancer Staging   Staging form: Breast, AJCC 8th Edition - Clinical stage from 12/17/2020: Stage IIA (cT1b, cN1(sn), cM0, G2, ER+, PR-, HER2-) - Signed by [name], MD on 01/19/2021 Histopathologic type: Infiltrating duct carcinoma, NOS Stage prefix: Initial diagnosis Method of lymph node assessment: Sentinel lymph node biopsy Nuclear grade: G2 Multigene prognostic tests performed: EndoPredict Histologic grading system: 3 grade system Laterality:  Left Tumor size (mm): 7 Lymph-vascular invasion (LVI): LVI not present (absent)/not identified Diagnostic confirmation: Positive histology Specimen type: Excision Staged by: Managing physician Menopausal status: Postmenopausal Ki-67 (%): 1 Stage used in treatment planning: Yes National guidelines used in treatment planning: Yes Type of national guideline used in treatment planning: NCCN    12/17/2020 Pathology Results   Breast, simple mastectomy, left:         Invasive ductal carcinoma, grade 2, 7 mm, and ductal carcinoma in situ         Ribbon clip and biopsy reaction present         All margins negative for invasive carcinoma and DCIS         Fibrocystic changes Lymph node, sentinel, biopsy, left axillary:         Tumor present in regional lymph node (1/1)   02/18/2021 -  Chemotherapy   Patient is on Treatment Plan : Breast AC q21 Days / PACLitaxel q7d       Interval history-  Patient presents to chemo care clinic today for initial meeting in preparation for starting chemotherapy. I introduced the chemo care clinic and we discussed that the role of the clinic is to assist those who are at an increased risk of emergency room visits and/or complications during the course of 66 treatment. We discussed that the increased risk takes into account factors such as 66, performance status, and co-morbidities. We also discussed that for some, this might include barriers to care such as not having a primary care provider, lack of insurance/transportation, or not being able to afford medications. We discussed that the goal of  the program is to help prevent unplanned ER visits and help reduce complications during chemotherapy. We do this by discussing specific risk factors to each individual and identifying ways that we can help improve these risk factors and reduce barriers to care.   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 02/12/2013   Depression with anxiety 08/30/2013   Diet-controlled diabetes mellitus ([place]) 10/31/2019   Difficulty in walking 08/30/2013   Disorder of sacrum 02/12/2013   Epilepsy ([place]) 08/30/2013   Excessive daytime sleepiness 07/03/2017   Family history of breast cancer    Family history of colon cancer    Family history of first degree relative with dementia 10/26/2017   Family history of lung cancer    Family history of prostate cancer    Family history of uterine cancer    Fibromyalgia    Gait abnormality 07/10/2012   Herpes zoster with nervous system complications 79/02/4095   History of seizure disorder 03/17/2019   Hypercholesteremia    Hyperlipidemia 08/30/2013   IBS (irritable bowel syndrome)    Insomnia 09/07/2015   Malignant neoplasm of central portion of left breast in female, estrogen receptor positive ([place]) 10/26/2020   Mass of upper outer quadrant of left breast 05/21/2017   Medication management 01/31/2019   Migraine headache 08/30/2013   Mild cognitive impairment 03/17/2019   Myalgia and myositis 02/12/2013   Neurogenic bladder 05/21/2020   Neuropathic pain 07/22/2020   Neuropathy 03/17/2019   Obesity, Class I, BMI 30-34.9 03/17/2019   Obstructive sleep apnea (adult) (pediatric) 08/30/2013   Pain in thoracic spine 02/12/2013   Palpitations 09/23/2019   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   Postlaminectomy syndrome, lumbar region 02/12/2013   Postoperative examination 06/19/2017   Postural hypotension  09/23/2019   Restless legs syndrome 05/22/2017   Risk for falls 09/07/2015   Seizures ([place])    Sleep apnea    nightly cpap   Spondylosis of thoracic region without myelopathy or radiculopathy 02/12/2013   Thoracic or lumbosacral neuritis or radiculitis 02/12/2013   Thoracic spondylosis with myelopathy 02/12/2013   Tremors of nervous system    Urinary incontinence 03/17/2019   Urinary incontinence, mixed 05/29/2013   Vitamin B 12 deficiency 03/17/2019   Vitamin D deficiency 03/17/2019   Weakness 07/10/2012    Past Surgical History:  Procedure Laterality Date   BLADDER REPAIR  03/13/2018   BLADDER SUSPENSION  03/13/2018   BREAST LUMPECTOMY  2019   COLONOSCOPY  12/20/2012   Left colonic diverticulosis predominantly in the sigmoid colon. Small internal hemmorhoids. Otherwise normal colonoscopy.    LAMINECTOMY  1998    Social History   Socioeconomic History   Marital status: Married    Spouse name: Not on file   Number of children: Not on file   Years of education: Not on file   Highest education level: Not on file  Occupational History   Not on file  Tobacco Use   Smoking status: Never   Smokeless tobacco: Never  Vaping Use  Vaping Use: Never used  Substance and Sexual Activity   Alcohol use: Never   Drug use: Never   Sexual activity: Not on file  Other Topics Concern   Not on file  Social History Narrative   Not on file   Social Determinants of Health   Financial Resource Strain: Not on file  Food Insecurity: Not on file  Transportation Needs: Not on file  Physical Activity: Not on file  Stress: Not on file  Social Connections: Not on file  Intimate Partner Violence: Not on file    Family History  Problem Relation Age of Onset   Hypercholesterolemia Mother    Alzheimer's disease Mother    Heart Problems Father    Clotting disorder Father    Alzheimer's disease Father    Prostate cancer Father        dx 79s   Allergic rhinitis Sister    Uterine  cancer Maternal Aunt        dx 39s, d. 66   Breast cancer Maternal Aunt 60       recurrence, metastatic, d. 37   Hemophilia Maternal Aunt    Breast cancer Maternal Aunt        dx 4-s d. 47s   Lung cancer Maternal Uncle 80       smoker   Lung cancer Maternal Uncle        60s   Breast cancer Paternal Aunt 50       dx 70s d. 28, 3 recurrences   Liver cancer Paternal Uncle    Kidney cancer Paternal Uncle    Colon cancer Maternal Grandfather        72s   Brain cancer Paternal Grandfather        7s   Colon cancer Cousin    Breast cancer Cousin        dx 64s, bilateral mastectomy   Esophageal cancer Neg Hx    Stomach cancer Neg Hx    Rectal cancer Neg Hx      Current Outpatient Medications:    cefdinir (OMNICEF) 300 MG capsule, Take by mouth., Disp: , Rfl:    dexamethasone (DECADRON) 4 MG tablet, Take 2 tablets (8 mg total) by mouth daily. Take daily for 3 days after chemo. Take with food., Disp: 30 tablet, Rfl: 1   dicyclomine (BENTYL) 10 MG capsule, Take 10 mg by mouth 3 (three) times daily as needed for pain. With IBS, Disp: , Rfl:    DULoxetine (CYMBALTA) 60 MG capsule, Take 60 mg by mouth daily., Disp: , Rfl:    EPINEPHrine 0.3 mg/0.3 mL IJ SOAJ injection, Inject 0.3 mg into the muscle as needed for anaphylaxis., Disp: , Rfl:    ezetimibe (ZETIA) 10 MG tablet, Take 10 mg by mouth daily., Disp: , Rfl:    fenofibrate 160 MG tablet, Take 160 mg by mouth daily., Disp: , Rfl:    gabapentin (NEURONTIN) 600 MG tablet, Take 1,200 mg by mouth 3 (three) times daily., Disp: , Rfl:    lidocaine (LIDODERM) 5 %, Place 1 patch onto the skin as needed for pain., Disp: , Rfl:    LINZESS 72 MCG capsule, Take 1 capsule (72 mcg total) by mouth daily., Disp: 90 capsule, Rfl: 4   metoprolol succinate (TOPROL-XL) 25 MG 24 hr tablet, TAKE 1 TABLET BY MOUTH EVERY DAY, Disp: 90 tablet, Rfl: 2   MYRBETRIQ 50 MG TB24 tablet, Take 50 mg by mouth daily., Disp: , Rfl:    ondansetron (ZOFRAN) 8  MG tablet,  Take 1 tablet (8 mg total) by mouth 2 (two) times daily as needed. Start on the third day after chemotherapy., Disp: 30 tablet, Rfl: 1   pramipexole (MIRAPEX) 1 MG tablet, Take 0.5 mg by mouth daily., Disp: , Rfl:    prochlorperazine (COMPAZINE) 10 MG tablet, Take 1 tablet (10 mg total) by mouth every 6 (six) hours as needed (Nausea or vomiting)., Disp: 30 tablet, Rfl: 1   traMADol (ULTRAM) 50 MG tablet, Take 50 mg by mouth 2 (two) times daily as needed for pain., Disp: , Rfl:    Vitamin D, Ergocalciferol, (DRISDOL) 1.25 MG (50000 UT) CAPS capsule, TAKE 1 CAPSULE BY MOUTH ONCE A WEEK FOR 30 DAYS, Disp: , Rfl:   CMP Latest Ref Rng & Units 02/03/2021  BUN 4 - 21 16  Creatinine 0.5 - 1.1 0.9  Sodium 137 - 147 138  Potassium 3.4 - 5.3 4.4  Chloride 99 - 108 104  CO2 13 - 22 26(A)  Calcium 8.7 - 10.7 9.5  Alkaline Phos 25 - 125 50  AST 13 - 35 43(A)  ALT 7 - 35 41(A)   CBC Latest Ref Rng & Units 02/03/2021  WBC - 6.3  Hemoglobin 12.0 - 16.0 15.6  Hematocrit 36 - 46 45  Platelets 150 - 399 286    No images are attached to the encounter.  ECHOCARDIOGRAM COMPLETE  Result Date: 02/09/2021    ECHOCARDIOGRAM REPORT   Patient Name:   KERRY-ANNE MEZO Date of Exam: 02/09/2021 Medical Rec #:  [id number]     Height:       65.0 in Accession #:    [id number]    Weight:       198.2 lb Date of Birth:  December 16, 1955     BSA:          1.971 m Patient Age:    58 years      BP:           107/59 mmHg Patient Gender: F             HR:           68 bpm. Exam Location:  [facility] Procedure: 2D Echo, Cardiac Doppler, Color Doppler and Strain Analysis Indications:    Malignant neoplasm of central portion of left breast in female,                 estrogen receptor positive ([place]) [C50.112, Z17.0 (ICD-10-CM)];                 Encounter for other specified special examinations [Z01.89                 (ICD-10-CM)]  History:        Patient has prior history of Echocardiogram examinations, most                 recent 10/14/2019. Risk  Factors:Dyslipidemia.  Sonographer:    Philipp Deputy RDCS Referring Phys: [place] Comments: Image acquisition challenging due to uncooperative patient. Patient having back spasms/pain thoroughout echo study. IMPRESSIONS  1. Left ventricular ejection fraction, by estimation, is 60 to 65%. The left ventricle has normal function. The left ventricle has no regional wall motion abnormalities. There is mild concentric left ventricular hypertrophy. Left ventricular diastolic parameters are consistent with Grade I diastolic dysfunction (impaired relaxation). The average left ventricular global longitudinal strain is -16.6 %.  2. Right ventricular systolic function is normal. The right ventricular size  is normal. Tricuspid regurgitation signal is inadequate for assessing PA pressure.  3. The mitral valve is normal in structure. No evidence of mitral valve regurgitation. No evidence of mitral stenosis.  4. The aortic valve is normal in structure. Aortic valve regurgitation is not visualized. No aortic stenosis is present.  5. The inferior vena cava is normal in size with greater than 50% respiratory variability, suggesting right atrial pressure of 3 mmHg. FINDINGS  Left Ventricle: Left ventricular ejection fraction, by estimation, is 60 to 65%. The left ventricle has normal function. The left ventricle has no regional wall motion abnormalities. The average left ventricular global longitudinal strain is -16.6 %. The left ventricular internal cavity size was normal in size. There is mild concentric left ventricular hypertrophy. Left ventricular diastolic parameters are consistent with Grade I diastolic dysfunction (impaired relaxation). Normal left ventricular filling pressure. Right Ventricle: The right ventricular size is normal. No increase in right ventricular wall thickness. Right ventricular systolic function is normal. Tricuspid regurgitation signal is inadequate for assessing PA  pressure. Left Atrium: Left atrial size was normal in size. Right Atrium: Right atrial size was normal in size. Pericardium: There is no evidence of pericardial effusion. Mitral Valve: The mitral valve is normal in structure. No evidence of mitral valve regurgitation. No evidence of mitral valve stenosis. Tricuspid Valve: The tricuspid valve is normal in structure. Tricuspid valve regurgitation is trivial. No evidence of tricuspid stenosis. Aortic Valve: The aortic valve is normal in structure. Aortic valve regurgitation is not visualized. No aortic stenosis is present. Pulmonic Valve: The pulmonic valve was not well visualized. Aorta: The aortic root and ascending aorta are structurally normal, with no evidence of dilitation and the aortic arch was not well visualized. Venous: The pulmonary veins were not well visualized. The inferior vena cava is normal in size with greater than 50% respiratory variability, suggesting right atrial pressure of 3 mmHg. IAS/Shunts: No atrial level shunt detected by color flow Doppler.  LEFT VENTRICLE PLAX 2D LVIDd:         4.30 cm Diastology LVIDs:         2.40 cm LV e' medial:  8.27 cm/s LV PW:         1.00 cm LV e' lateral: 9.25 cm/s LV IVS:        1.00 cm                        2D Longitudinal Strain                        2D Strain GLS Avg:     -16.6 % RIGHT VENTRICLE             IVC RV Basal diam:  2.10 cm     IVC diam: 1.60 cm RV S prime:     13.10 cm/s TAPSE (M-mode): 2.5 cm LEFT ATRIUM             Index        RIGHT ATRIUM           Index LA diam:        3.00 cm 1.52 cm/m   RA Area:     12.40 cm LA Vol (A2C):   34.0 ml 17.25 ml/m  RA Volume:   28.30 ml  14.36 ml/m LA Vol (A4C):   20.8 ml 10.56 ml/m LA Biplane Vol: 28.8 ml 14.62 ml/m  AORTIC VALVE LVOT Vmax:   79.00  cm/s LVOT Vmean:  58.500 cm/s LVOT VTI:    0.183 m  AORTA Ao Root diam: 3.30 cm Ao Asc diam:  2.60 cm  SHUNTS Systemic VTI: 0.18 m Shirlee More MD Electronically signed by Shirlee More MD Signature Date/Time:  02/09/2021/5:24:10 PM    Final      Assessment and plan- Patient is a 66 y.o. female who presents to [facility] for initial meeting in preparation for starting chemotherapy for the treatment of breast cancer.   Chemo Care Clinic/High Risk for ER/Hospitalization during chemotherapy- We discussed the role of the chemo care clinic and identified patient specific risk factors. 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 02/12/2013   Depression with anxiety 08/30/2013   Diet-controlled diabetes mellitus ([place]) 10/31/2019   Difficulty in walking 08/30/2013   Disorder of sacrum 02/12/2013   Epilepsy ([place]) 08/30/2013   Excessive daytime sleepiness 07/03/2017   Family history of breast cancer    Family history of colon cancer    Family history of first degree relative with dementia 10/26/2017   Family history of lung cancer    Family history of prostate cancer    Family history of uterine cancer    Fibromyalgia    Gait abnormality 07/10/2012   Herpes zoster with nervous system complications 40/10/2723   History of seizure disorder 03/17/2019   Hypercholesteremia    Hyperlipidemia 08/30/2013   IBS (irritable bowel syndrome)    Insomnia 09/07/2015   Malignant neoplasm of central portion of left breast in female, estrogen receptor positive ([place]) 10/26/2020   Mass of upper outer quadrant of left breast 05/21/2017   Medication management 01/31/2019   Migraine headache 08/30/2013   Mild cognitive impairment 03/17/2019    Myalgia and myositis 02/12/2013   Neurogenic bladder 05/21/2020   Neuropathic pain 07/22/2020   Neuropathy 03/17/2019   Obesity, Class I, BMI 30-34.9 03/17/2019   Obstructive sleep apnea (adult) (pediatric) 08/30/2013   Pain in thoracic spine 02/12/2013   Palpitations 09/23/2019   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   Postlaminectomy syndrome, lumbar region 02/12/2013   Postoperative examination 06/19/2017   Postural hypotension 09/23/2019   Restless legs syndrome 05/22/2017   Risk for falls 09/07/2015   Seizures ([place])    Sleep apnea    nightly cpap   Spondylosis of thoracic region without myelopathy or radiculopathy 02/12/2013   Thoracic or lumbosacral neuritis or radiculitis 02/12/2013   Thoracic spondylosis with myelopathy 02/12/2013   Tremors of nervous system    Urinary incontinence 03/17/2019   Urinary incontinence, mixed 05/29/2013   Vitamin B 12 deficiency 03/17/2019   Vitamin D deficiency 03/17/2019   Weakness 07/10/2012    Patient has past surgical history positive for: Past Surgical History:  Procedure Laterality Date   BLADDER REPAIR  03/13/2018   BLADDER SUSPENSION  03/13/2018   BREAST LUMPECTOMY  2019   COLONOSCOPY  12/20/2012   Left colonic diverticulosis predominantly in the sigmoid colon. Small internal hemmorhoids. Otherwise normal colonoscopy.    LAMINECTOMY  1998   Provided general information including the following:  1.  Date of education: 02/10/2021 2.  Physician name: Dr. Hinton Rao 3.  Diagnosis: Breast Cancer 4.  Stage: Stage IIA 5.  Curative or palliative 6.  Chemotherapy plan including drugs and how often: Doxorubicin, cyclophosphamide, paclitaxel 7.  Start date: 02/18/2021 8.  Other referrals: None at this time 9.  The patient is to call our office with any questions or concerns.  Our office number [phone], if after hours or on the weekend, call the same number and wait for the answering service.  There is always an oncologist on  call 10.  Medications prescribed: Dexamethasone, Prochlorperazine, Ondansetron 11.  The patient has verbalized understanding of the treatment plan and has no barriers to adherence or understanding.  Obtained signed consent from patient.  Discussed symptoms including 1.  Low blood counts including red blood cells, white blood cells and platelets. 2. Infection including to avoid large crowds, wash hands frequently, and stay away from people who were sick.  If fever develops of 100.4 or higher, call our office. 3.  Mucositis-given instructions on mouth rinse (baking soda and salt mixture).  Keep mouth clean.  Use soft bristle toothbrush.  If mouth sores develop, call our clinic. 4.  Nausea/vomiting-gave prescriptions for ondansetron 4 mg every 4 hours as needed for nausea, may take around the clock if persistent.  Compazine 10 mg every 6 hours, may take around the clock if persistent. 5.  Diarrhea-use over-the-counter Imodium.  Call clinic if not controlled. 6.  Constipation-use senna, 1 to 2 tablets twice a day.  If no BM in 2 to 3 days call the clinic. 7.  Loss of appetite-try to eat small meals every 2-3 hours.  Call clinic if not eating. 8.  Taste changes-zinc 500 mg daily.  If becomes severe call clinic. 9.  Alcoholic beverages. 10.  Drink 2 to 3 quarts of water per day. 11.  Peripheral neuropathy-patient to call if numbness or tingling in hands or feet is persistent  Neulasta-will be given 24 to 48 hours after chemotherapy.  Gave information sheet on bone and joint pain.  Use Claritin or Pepcid.  May use ibuprofen or Aleve.  Call if symptoms persist or are unbearable.  Gave information on the supportive care team and how to contact them regarding services.  Discussed advanced directives.  The patient does not have their advanced directives but will look at the copy provided in their notebook and will call with any questions. Spiritual Nutrition Financial Social worker Advanced  directives  Answered questions to patient satisfaction.  Patient is to call with any further questions or concerns.   The medication prescribed to the patient will be printed out from [email or web address] This will give the following information: Name of your medication Approved uses Dose and schedule Storage and handling Handling body fluids and waste Drug and food interactions Possible side effects and management Pregnancy, sexual activity, and contraception Obtaining medication   We discussed that social determinants of health may have significant impacts on health and outcomes for cancer patients.  Today we discussed specific social determinants of performance status, alcohol use, depression, financial needs, food insecurity, housing, interpersonal violence, social connections, stress, tobacco use, and transportation.    After lengthy discussion the following were identified as areas of need:   Outpatient services: We discussed options including home based and outpatient services, DME and care program. We discusssed that patients who participate in regular physical activity report fewer negative impacts of cancer and treatments and report less fatigue.  Financial Concerns: We discussed that living with cancer can create tremendous financial burden.  We discussed options for assistance. I asked that if assistance is needed in affording medications or paying bills to please let us know so that we can provide assistance. We discussed options for food including social services and onsite food pantry.  We will also notify Mort Sawyers to see if cancer center can provide additional support.  Referral to Social work: Introduced Education officer, museum Mort Sawyers and the services he can provide such as support with utility bill, cell phone and gas vouchers.   Support groups: We discussed options for support groups at the cancer center. If interested, please notify nurse navigator to enroll. We  discussed options for managing stress including healthy eating, exercise as well as participating in no charge counseling services at the cancer center and support groups.  If these are of interest, patient can notify either myself or primary nursing team.We discussed options for management including medications and referral to quit Smart program  Transportation: We discussed options for transportation.  I have notified primary oncology team who will help assist with arranging Lucianne Lei transportation for appointments when/if needed. We also discussed options for transportation on short notice/acute visits.  Palliative care services: We have palliative care services available in the cancer center to discuss goals of care and advanced care planning.  Please let us know if you have any questions or would like to speak to our palliative nurse practitioner.  Symptom Management Clinic: We discussed our symptom management clinic which is available for acute concerns while receiving treatment such as nausea, vomiting or diarrhea.  We can be reached via telephone at [phone] or through my chart.  We are available for virtual or in person visits on the same day from 830 to 4 PM Monday through Friday. She denies needing specific assistance at this time and She will be followed by Dr. Hinton Rao clinical team.  Plan: Discussed symptom management clinic. Discussed palliative care services. Discussed resources that are available here at the cancer center. Discussed medications and new prescriptions to begin treatment such as anti-nausea or steroids.   Disposition: RTC on   Visit Diagnosis 1. Malignant neoplasm of central portion of left breast in female, estrogen receptor positive ([place])     Patient expressed understanding and was in agreement with this plan. She also understands that She can call clinic at any time with any questions, concerns, or complaints.   I provided 10 minutes of  face to face  during  this encounter, and > 50% was spent counseling as documented under my assessment & plan.   Dayton Scrape, FNP- [facility] [place]

## 2021-02-11 ENCOUNTER — Other Ambulatory Visit: Payer: Self-pay | Admitting: Pharmacist

## 2021-02-11 NOTE — Progress Notes (Signed)
..  Pharmacist Chemotherapy Monitoring - Initial Assessment    Anticipated start date: 02/18/21  The following has been reviewed per standard work regarding the patient's treatment regimen: The patient's diagnosis, treatment plan and drug doses, and organ/hematologic function Lab orders and baseline tests specific to treatment regimen  The treatment plan start date, drug sequencing, and pre-medications Prior authorization status  Patient's documented medication list, including drug-drug interaction screen and prescriptions for anti-emetics and supportive care specific to the treatment regimen The drug concentrations, fluid compatibility, administration routes, and timing of the medications to be used The patient's access for treatment and lifetime cumulative dose history, if applicable  The patient's medication allergies and previous infusion related reactions, if applicable   Changes made to treatment plan:  N/A  Follow up needed:  - port placement scheduled 2/15    Brandy Maxwell, [facility], 02/11/2021  2:04 PM

## 2021-02-13 ENCOUNTER — Encounter: Payer: Self-pay | Admitting: Oncology

## 2021-02-15 ENCOUNTER — Other Ambulatory Visit: Payer: Self-pay | Admitting: Licensed Clinical Social Worker

## 2021-02-15 DIAGNOSIS — Z170 Estrogen receptor positive status [ER+]: Secondary | ICD-10-CM

## 2021-02-16 ENCOUNTER — Other Ambulatory Visit: Payer: Self-pay

## 2021-02-16 ENCOUNTER — Inpatient Hospital Stay: Payer: Medicare Other

## 2021-02-16 ENCOUNTER — Encounter: Payer: Self-pay | Admitting: Hematology and Oncology

## 2021-02-16 DIAGNOSIS — Z170 Estrogen receptor positive status [ER+]: Secondary | ICD-10-CM

## 2021-02-16 DIAGNOSIS — C50112 Malignant neoplasm of central portion of left female breast: Secondary | ICD-10-CM

## 2021-02-16 LAB — HEPATIC FUNCTION PANEL
ALT: 30 (ref 7–35)
AST: 40 — AB (ref 13–35)
Alkaline Phosphatase: 40 (ref 25–125)
Bilirubin, Total: 0.4

## 2021-02-16 LAB — BASIC METABOLIC PANEL
BUN: 14 (ref 4–21)
CO2: 24 — AB (ref 13–22)
Chloride: 109 — AB (ref 99–108)
Creatinine: 0.8 (ref 0.5–1.1)
Glucose: 128
Potassium: 4.1 (ref 3.4–5.3)
Sodium: 140 (ref 137–147)

## 2021-02-16 LAB — CBC AND DIFFERENTIAL
HCT: 41 (ref 36–46)
Hemoglobin: 13.9 (ref 12.0–16.0)
Neutrophils Absolute: 2.81
Platelets: 290 (ref 150–399)
WBC: 5.5

## 2021-02-16 LAB — COMPREHENSIVE METABOLIC PANEL
Albumin: 3.8 (ref 3.5–5.0)
Calcium: 8.6 — AB (ref 8.7–10.7)

## 2021-02-16 LAB — CBC: RBC: 4.34 (ref 3.87–5.11)

## 2021-02-17 MED FILL — Fosaprepitant Dimeglumine For IV Infusion 150 MG (Base Eq): INTRAVENOUS | Qty: 5 | Status: AC

## 2021-02-17 MED FILL — Doxorubicin HCl Inj 2 MG/ML: INTRAVENOUS | Qty: 61 | Status: AC

## 2021-02-17 MED FILL — Cyclophosphamide For Inj 1 GM: INTRAMUSCULAR | Qty: 61 | Status: AC

## 2021-02-18 ENCOUNTER — Other Ambulatory Visit: Payer: Self-pay

## 2021-02-18 ENCOUNTER — Inpatient Hospital Stay: Payer: Medicare Other

## 2021-02-18 VITALS — BP 112/61 | HR 86 | Temp 98.2°F | Resp 20 | Ht 65.0 in | Wt 197.0 lb

## 2021-02-18 DIAGNOSIS — Z5111 Encounter for antineoplastic chemotherapy: Secondary | ICD-10-CM | POA: Diagnosis not present

## 2021-02-18 DIAGNOSIS — Z5189 Encounter for other specified aftercare: Secondary | ICD-10-CM | POA: Diagnosis not present

## 2021-02-18 DIAGNOSIS — C50112 Malignant neoplasm of central portion of left female breast: Secondary | ICD-10-CM

## 2021-02-18 DIAGNOSIS — C773 Secondary and unspecified malignant neoplasm of axilla and upper limb lymph nodes: Secondary | ICD-10-CM | POA: Diagnosis not present

## 2021-02-18 DIAGNOSIS — Z170 Estrogen receptor positive status [ER+]: Secondary | ICD-10-CM | POA: Diagnosis not present

## 2021-02-18 MED ORDER — SODIUM CHLORIDE 0.9 % IV SOLN: Freq: Once | INTRAVENOUS | Status: AC

## 2021-02-18 MED ORDER — SODIUM CHLORIDE 0.9 % IV SOLN
10.0000 mg | Freq: Once | INTRAVENOUS | Status: AC
  Administered 2021-02-18: 10 mg via INTRAVENOUS
  Filled 2021-02-18: qty 1

## 2021-02-18 MED ORDER — SODIUM CHLORIDE 0.9 % IV SOLN
150.0000 mg | Freq: Once | INTRAVENOUS | Status: AC
  Administered 2021-02-18: 150 mg via INTRAVENOUS
  Filled 2021-02-18: qty 150

## 2021-02-18 MED ORDER — HEPARIN SOD (PORK) LOCK FLUSH 100 UNIT/ML IV SOLN
500.0000 [IU] | Freq: Once | INTRAVENOUS | Status: AC | PRN
  Administered 2021-02-18: 500 [IU]

## 2021-02-18 MED ORDER — SODIUM CHLORIDE 0.9% FLUSH
10.0000 mL | INTRAVENOUS | Status: DC | PRN
  Administered 2021-02-18: 10 mL

## 2021-02-18 MED ORDER — DOXORUBICIN HCL CHEMO IV INJECTION 2 MG/ML
60.0000 mg/m2 | Freq: Once | INTRAVENOUS | Status: AC
  Administered 2021-02-18: 122 mg via INTRAVENOUS
  Filled 2021-02-18: qty 61

## 2021-02-18 MED ORDER — PALONOSETRON HCL INJECTION 0.25 MG/5ML
0.2500 mg | Freq: Once | INTRAVENOUS | Status: AC
  Administered 2021-02-18: 0.25 mg via INTRAVENOUS
  Filled 2021-02-18: qty 5

## 2021-02-18 MED ORDER — SODIUM CHLORIDE 0.9 % IV SOLN
600.0000 mg/m2 | Freq: Once | INTRAVENOUS | Status: AC
  Administered 2021-02-18: 1220 mg via INTRAVENOUS
  Filled 2021-02-18: qty 61

## 2021-02-18 NOTE — Patient Instructions (Signed)
Cyclophosphamide Injection What is this medication? CYCLOPHOSPHAMIDE (sye kloe FOSS fa mide) is a chemotherapy drug. It slows the growth of cancer cells. This medicine is used to treat many types of cancer like lymphoma, myeloma, leukemia, breast cancer, and ovarian cancer, to name a few. This medicine may be used for other purposes; ask your health care provider or pharmacist if you have questions. COMMON BRAND NAME(S): Cytoxan, Neosar What should I tell my care team before I take this medication? They need to know if you have any of these conditions: heart disease history of irregular heartbeat infection kidney disease liver disease low blood counts, like white cells, platelets, or red blood cells on hemodialysis recent or ongoing radiation therapy scarring or thickening of the lungs trouble passing urine an unusual or allergic reaction to cyclophosphamide, other medicines, foods, dyes, or preservatives pregnant or trying to get pregnant breast-feeding How should I use this medication? This drug is usually given as an injection into a vein or muscle or by infusion into a vein. It is administered in a hospital or clinic by a specially trained health care professional. Talk to your pediatrician regarding the use of this medicine in children. Special care may be needed. Overdosage: If you think you have taken too much of this medicine contact a poison control center or emergency room at once. NOTE: This medicine is only for you. Do not share this medicine with others. What if I miss a dose? It is important not to miss your dose. Call your doctor or health care professional if you are unable to keep an appointment. What may interact with this medication? amphotericin B azathioprine certain antivirals for HIV or hepatitis certain medicines for blood pressure, heart disease, irregular heart beat certain medicines that treat or prevent blood clots like warfarin certain other medicines for  cancer cyclosporine etanercept indomethacin medicines that relax muscles for surgery medicines to increase blood counts metronidazole This list may not describe all possible interactions. Give your health care provider a list of all the medicines, herbs, non-prescription drugs, or dietary supplements you use. Also tell them if you smoke, drink alcohol, or use illegal drugs. Some items may interact with your medicine. What should I watch for while using this medication? Your condition will be monitored carefully while you are receiving this medicine. You may need blood work done while you are taking this medicine. Drink water or other fluids as directed. Urinate often, even at night. Some products may contain alcohol. Ask your health care professional if this medicine contains alcohol. Be sure to tell all health care professionals you are taking this medicine. Certain medicines, like metronidazole and disulfiram, can cause an unpleasant reaction when taken with alcohol. The reaction includes flushing, headache, nausea, vomiting, sweating, and increased thirst. The reaction can last from 30 minutes to several hours. Do not become pregnant while taking this medicine or for 1 year after stopping it. Women should inform their health care professional if they wish to become pregnant or think they might be pregnant. Men should not father a child while taking this medicine and for 4 months after stopping it. There is potential for serious side effects to an unborn child. Talk to your health care professional for more information. Do not breast-feed an infant while taking this medicine or for 1 week after stopping it. This medicine has caused ovarian failure in some women. This medicine may make it more difficult to get pregnant. Talk to your health care professional if you are concerned  about your fertility. This medicine has caused decreased sperm counts in some men. This may make it more difficult to  father a child. Talk to your health care professional if you are concerned about your fertility. Call your health care professional for advice if you get a fever, chills, or sore throat, or other symptoms of a cold or flu. Do not treat yourself. This medicine decreases your body's ability to fight infections. Try to avoid being around people who are sick. Avoid taking medicines that contain aspirin, acetaminophen, ibuprofen, naproxen, or ketoprofen unless instructed by your health care professional. These medicines may hide a fever. Talk to your health care professional about your risk of cancer. You may be more at risk for certain types of cancer if you take this medicine. If you are going to need surgery or other procedure, tell your health care professional that you are using this medicine. Be careful brushing or flossing your teeth or using a toothpick because you may get an infection or bleed more easily. If you have any dental work done, tell your dentist you are receiving this medicine. What side effects may I notice from receiving this medication? Side effects that you should report to your doctor or health care professional as soon as possible: allergic reactions like skin rash, itching or hives, swelling of the face, lips, or tongue breathing problems nausea, vomiting signs and symptoms of bleeding such as bloody or black, tarry stools; red or dark brown urine; spitting up blood or brown material that looks like coffee grounds; red spots on the skin; unusual bruising or bleeding from the eyes, gums, or nose signs and symptoms of heart failure like fast, irregular heartbeat, sudden weight gain; swelling of the ankles, feet, hands signs and symptoms of infection like fever; chills; cough; sore throat; pain or trouble passing urine signs and symptoms of kidney injury like trouble passing urine or change in the amount of urine signs and symptoms of liver injury like dark yellow or brown urine;  general ill feeling or flu-like symptoms; light-colored stools; loss of appetite; nausea; right upper belly pain; unusually weak or tired; yellowing of the eyes or skin Side effects that usually do not require medical attention (report to your doctor or health care professional if they continue or are bothersome): confusion decreased hearing diarrhea facial flushing hair loss headache loss of appetite missed menstrual periods signs and symptoms of low red blood cells or anemia such as unusually weak or tired; feeling faint or lightheaded; falls skin discoloration This list may not describe all possible side effects. Call your doctor for medical advice about side effects. You may report side effects to FDA at 1-800-FDA-1088. Where should I keep my medication? This drug is given in a hospital or clinic and will not be stored at home. NOTE: This sheet is a summary. It may not cover all possible information. If you have questions about this medicine, talk to your doctor, pharmacist, or health care provider.  2022 Elsevier/Gold Standard (2020-09-07 00:00:00) Doxorubicin injection What is this medication? DOXORUBICIN (dox oh ROO bi sin) is a chemotherapy drug. It is used to treat many kinds of cancer like leukemia, lymphoma, neuroblastoma, sarcoma, and Wilms' tumor. It is also used to treat bladder cancer, breast cancer, lung cancer, ovarian cancer, stomach cancer, and thyroid cancer. This medicine may be used for other purposes; ask your health care provider or pharmacist if you have questions. COMMON BRAND NAME(S): Adriamycin, Adriamycin PFS, Adriamycin RDF, Rubex What should I tell  my care team before I take this medication? They need to know if you have any of these conditions: heart disease history of low blood counts caused by a medicine liver disease recent or ongoing radiation therapy an unusual or allergic reaction to doxorubicin, other chemotherapy agents, other medicines, foods,  dyes, or preservatives pregnant or trying to get pregnant breast-feeding How should I use this medication? This drug is given as an infusion into a vein. It is administered in a hospital or clinic by a specially trained health care professional. If you have pain, swelling, burning or any unusual feeling around the site of your injection, tell your health care professional right away. Talk to your pediatrician regarding the use of this medicine in children. Special care may be needed. Overdosage: If you think you have taken too much of this medicine contact a poison control center or emergency room at once. NOTE: This medicine is only for you. Do not share this medicine with others. What if I miss a dose? It is important not to miss your dose. Call your doctor or health care professional if you are unable to keep an appointment. What may interact with this medication? This medicine may interact with the following medications: 6-mercaptopurine paclitaxel phenytoin St. John's Wort trastuzumab verapamil This list may not describe all possible interactions. Give your health care provider a list of all the medicines, herbs, non-prescription drugs, or dietary supplements you use. Also tell them if you smoke, drink alcohol, or use illegal drugs. Some items may interact with your medicine. What should I watch for while using this medication? This drug may make you feel generally unwell. This is not uncommon, as chemotherapy can affect healthy cells as well as cancer cells. Report any side effects. Continue your course of treatment even though you feel ill unless your doctor tells you to stop. There is a maximum amount of this medicine you should receive throughout your life. The amount depends on the medical condition being treated and your overall health. Your doctor will watch how much of this medicine you receive in your lifetime. Tell your doctor if you have taken this medicine before. You may need  blood work done while you are taking this medicine. Your urine may turn red for a few days after your dose. This is not blood. If your urine is dark or brown, call your doctor. In some cases, you may be given additional medicines to help with side effects. Follow all directions for their use. Call your doctor or health care professional for advice if you get a fever, chills or sore throat, or other symptoms of a cold or flu. Do not treat yourself. This drug decreases your body's ability to fight infections. Try to avoid being around people who are sick. This medicine may increase your risk to bruise or bleed. Call your doctor or health care professional if you notice any unusual bleeding. Talk to your doctor about your risk of cancer. You may be more at risk for certain types of cancers if you take this medicine. Do not become pregnant while taking this medicine or for 6 months after stopping it. Women should inform their doctor if they wish to become pregnant or think they might be pregnant. Men should not father a child while taking this medicine and for 6 months after stopping it. There is a potential for serious side effects to an unborn child. Talk to your health care professional or pharmacist for more information. Do not breast-feed an  infant while taking this medicine. This medicine has caused ovarian failure in some women and reduced sperm counts in some men This medicine may interfere with the ability to have a child. Talk with your doctor or health care professional if you are concerned about your fertility. This medicine may cause a decrease in Co-Enzyme Q-10. You should make sure that you get enough Co-Enzyme Q-10 while you are taking this medicine. Discuss the foods you eat and the vitamins you take with your health care professional. What side effects may I notice from receiving this medication? Side effects that you should report to your doctor or health care professional as soon as  possible: allergic reactions like skin rash, itching or hives, swelling of the face, lips, or tongue breathing problems chest pain fast or irregular heartbeat low blood counts - this medicine may decrease the number of white blood cells, red blood cells and platelets. You may be at increased risk for infections and bleeding. pain, redness, or irritation at site where injected signs of infection - fever or chills, cough, sore throat, pain or difficulty passing urine signs of decreased platelets or bleeding - bruising, pinpoint red spots on the skin, black, tarry stools, blood in the urine swelling of the ankles, feet, hands tiredness weakness Side effects that usually do not require medical attention (report to your doctor or health care professional if they continue or are bothersome): diarrhea hair loss mouth sores nail discoloration or damage nausea red colored urine vomiting This list may not describe all possible side effects. Call your doctor for medical advice about side effects. You may report side effects to FDA at 1-800-FDA-1088. Where should I keep my medication? This drug is given in a hospital or clinic and will not be stored at home. NOTE: This sheet is a summary. It may not cover all possible information. If you have questions about this medicine, talk to your doctor, pharmacist, or health care provider.  2022 Elsevier/Gold Standard (2016-08-24 00:00:00)

## 2021-02-21 ENCOUNTER — Inpatient Hospital Stay: Payer: Medicare Other

## 2021-02-21 ENCOUNTER — Other Ambulatory Visit: Payer: Self-pay

## 2021-02-21 VITALS — BP 118/84 | HR 76 | Temp 98.6°F | Resp 20 | Ht 65.0 in | Wt 198.8 lb

## 2021-02-21 DIAGNOSIS — Z170 Estrogen receptor positive status [ER+]: Secondary | ICD-10-CM

## 2021-02-21 DIAGNOSIS — Z5111 Encounter for antineoplastic chemotherapy: Secondary | ICD-10-CM | POA: Diagnosis not present

## 2021-02-21 MED ORDER — PEGFILGRASTIM-CBQV 6 MG/0.6ML ~~LOC~~ SOSY
6.0000 mg | PREFILLED_SYRINGE | Freq: Once | SUBCUTANEOUS | Status: AC
  Administered 2021-02-21: 6 mg via SUBCUTANEOUS
  Filled 2021-02-21: qty 0.6

## 2021-02-21 NOTE — Patient Instructions (Signed)

## 2021-02-24 ENCOUNTER — Telehealth: Payer: Self-pay

## 2021-02-24 ENCOUNTER — Other Ambulatory Visit: Payer: Self-pay

## 2021-02-24 NOTE — Telephone Encounter (Addendum)
I spoke with pt. She states, "I'm doing pretty good. No nausea and vomiting. I do have some mouth sore. I'm just really tired. I mean even lifting my arms is rough". Pt does not have an MMW @ home so we will get script sent in for that. Pt is incontinent of bowel and bladder (baseline). "I get UTI's often. I had a Botox injection prior to starting my treatment". She admits to some dysuria. She does have the metallic taste in her mouth and smells an odor top her body. I educated her about using plastic ware, avoiding red meats, trying frozen grapes to keep mouth moist, sugarless candies, and trying OTC zinc to help with taste changes. We discussed hair loss and what to expect. I also mentioned that she may eventually notice some nail discoloration. Pt reminded of the importance of calling us if she develops temp of 100.4 or higher day or night. She verbalized understanding. She thanked me for checking on her.

## 2021-02-25 ENCOUNTER — Encounter: Payer: Self-pay | Admitting: Oncology

## 2021-02-28 NOTE — Progress Notes (Signed)
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 She has undergone left mastectomy but one node was positive for malignancy. I definitely recommend hormonal therapy. I am concerned about her risk for recurrence in view of the positive node, grade 2 histology, and the low level of estrogen receptors with negative progesterone receptors. EndoPredict has revealed her to be extremely high risk with an EP clin score of 5.4, and so I recommend chemotherapy with AC every 3 weeks followed by weekly Taxol. Her risk of recurrence in the next 10 years is 55% and she has an estimated chemotherapy benefit of 27%. She tolerated cycle 1 fairly well with minimal side effects. She did report a couple of days of feeling "out of it". I asked her about her blood sugars during those days and she did note they were elevated. I advised that she can hold the decadron during the next cycle to see if that helps. She will keep scheduled follow up with Dr. Hinton Rao.      The patient understands the plans discussed today and is in agreement with them.  She knows to contact our office if she develops concerns prior to her next appointment.    Melodye Ped, NP  [facility] [address] [place] [address] Dept: [phone] Dept Fax: [phone]   No orders of the defined types were placed in this encounter.     CHIEF COMPLAINT:  CC: A 66 year old female with history of breast cancer here for 3 week evaluation  Current Treatment:  AC  every 21 days x 4 cycles followed by weekly Paclitaxel  INTERVAL HISTORY:  Makayli is here today for repeat clinical assessment. She denies fevers or chills. She denies pain. Her appetite is good. Her weight has been stable.  I have reviewed the past medical history, past surgical history, social history and family history with the patient and they are unchanged from previous note.  ALLERGIES:  is allergic to nitrofurantoin and sulfa antibiotics.  MEDICATIONS:  Current Outpatient Medications  Medication Sig Dispense Refill   dexamethasone (DECADRON) 4 MG tablet Take 2 tablets (8 mg total) by mouth daily. Take daily for 3 days after chemo. Take with food. 30 tablet 1   dicyclomine (BENTYL) 10 MG capsule Take 10 mg by mouth 3 (three) times daily as needed for pain. With IBS     DULoxetine (CYMBALTA) 60 MG capsule Take 60 mg by mouth daily.     EPINEPHrine 0.3 mg/0.3 mL IJ SOAJ injection Inject 0.3 mg into the muscle as needed for anaphylaxis.     ezetimibe (ZETIA) 10 MG tablet Take 10 mg by mouth daily.     fenofibrate 160 MG tablet Take 160 mg by mouth daily.     gabapentin (NEURONTIN) 600 MG tablet Take 1,200 mg by mouth 3 (three) times daily.     lidocaine (LIDODERM) 5 % Place 1 patch onto the skin as needed for pain.     lidocaine (XYLOCAINE) 2 % solution SMARTSIG:By Mouth     LINZESS 72 MCG capsule  Take 1 capsule (72 mcg total) by mouth daily. 90 capsule 4   magic mouthwash (lidocaine, diphenhydrAMINE, alum & mag hydroxide) suspension Take 5 mLs by mouth every 3 (three) hours as needed. Swish and spit for mouth or throat pain     metoprolol succinate (TOPROL-XL) 25 MG 24 hr tablet Take 1 tablet by mouth daily.     MYRBETRIQ 50 MG TB24 tablet Take 50 mg by mouth daily.     ondansetron (ZOFRAN) 8 MG tablet Take 1 tablet (8 mg total) by mouth 2 (two) times daily as needed. Start on the third day after chemotherapy. 30 tablet 1   pramipexole (MIRAPEX) 1 MG tablet Take 0.5 mg by mouth daily.      prochlorperazine (COMPAZINE) 10 MG tablet Take 1 tablet (10 mg total) by mouth every 6 (six) hours as needed (Nausea or vomiting). 30 tablet 1   traMADol (ULTRAM) 50 MG tablet Take 50 mg by mouth 2 (two) times daily as needed for pain.     Vitamin D, Ergocalciferol, (DRISDOL) 1.25 MG (50000 UT) CAPS capsule TAKE 1 CAPSULE BY MOUTH ONCE A WEEK FOR 30 DAYS     No current facility-administered medications for this visit.    HISTORY OF PRESENT ILLNESS:   Oncology History  Malignant neoplasm of central portion of left breast in female, estrogen receptor positive ([place])  09/16/2020 Mammogram   DIGITAL SCREENING BILATERAL MAMMOGRAM: In the left breast, a possible mass warrants further evaluation. In the right breast, no findings suspicious for malignancy.    10/08/2020 Mammogram   DIAGNOSTIC UNILATERAL LEFT MAMMOGRAM AND LEFT BREAST ULTRASOUND: There is architectural distortion at the left breast 12 o'clock.  Targeted ultrasound is performed, showing no focal abnormal discrete cystic or solid lesion at left breast 12 o'clock the correlate to the mammographic finding. Ultrasound of the left axilla is negative.   10/20/2020 Pathology Results   Breast, left, needle core biopsy, 12 o'clock:             Invasive mammary carcinoma             Mammary carcinoma in situ HER2: Negative (0) ER: Positive 30% PR: Negative 0% Ki67: 1%   10/26/2020 Initial Diagnosis   Malignant neoplasm of central portion of left breast in female, estrogen receptor positive ([place])   12/17/2020 Cancer Staging   Staging form: Breast, AJCC 8th Edition - Clinical stage from 12/17/2020: Stage IIA (cT1b, cN1(sn), cM0, G2, ER+, PR-, HER2-) - Signed by [name], MD on 02/13/2021 Histopathologic type: Infiltrating duct carcinoma, NOS Stage prefix: Initial diagnosis Method of lymph node assessment: Sentinel lymph node biopsy Nuclear grade: G2 Multigene prognostic tests performed: EndoPredict Histologic grading  system: 3 grade system Laterality: Left Tumor size (mm): 7 Lymph-vascular invasion (LVI): LVI not present (absent)/not identified Diagnostic confirmation: Positive histology Specimen type: Excision Staged by: Managing physician Menopausal status: Postmenopausal Ki-67 (%): 1 EndoPredict EPclin risk score: 5.4 EndoPredict EPclin risk level: High risk EndoPredict 10-year percentage risk of distant recurrence (%): 55 EndoPredict 10-year risk of distant recurrence: High risk Stage used in treatment planning: Yes National guidelines used in treatment planning: Yes Type of national guideline used in treatment planning: NCCN Staging comments: Endopredict with 27% benefit from chemotherapy    12/17/2020 Pathology Results   Breast, simple mastectomy, left:         Invasive ductal carcinoma, grade 2, 7 mm, and ductal carcinoma in situ         Ribbon clip and biopsy reaction present  All margins negative for invasive carcinoma and DCIS         Fibrocystic changes Lymph node, sentinel, biopsy, left axillary:         Tumor present in regional lymph node (1/1)   02/18/2021 -  Chemotherapy   Patient is on Treatment Plan : Breast AC q21 Days / PACLitaxel q7d         REVIEW OF SYSTEMS:   Constitutional: Denies fevers, chills or abnormal weight loss Eyes: Denies blurriness of vision Ears, nose, mouth, throat, and face: Denies mucositis or sore throat Respiratory: Denies cough, dyspnea or wheezes Cardiovascular: Denies palpitation, chest discomfort or lower extremity swelling Gastrointestinal:  Denies nausea, heartburn or change in bowel habits Skin: Denies abnormal skin rashes Lymphatics: Denies new lymphadenopathy or easy bruising Neurological:Denies numbness, tingling or new weaknesses Behavioral/Psych: Mood is stable, no new changes  All other systems were reviewed with the patient and are negative.   VITALS:  Blood pressure 111/75, pulse (!) 110, temperature 97.6 F (36.4  C), temperature source Oral, resp. rate 20, height 5' 3.5" (1.613 m), weight 195 lb (88.5 kg), SpO2 95 %.  Wt Readings from Last 3 Encounters:  03/01/21 195 lb (88.5 kg)  02/21/21 198 lb 12 oz (90.2 kg)  02/18/21 197 lb (89.4 kg)    Body mass index is 34 kg/m.  Performance status (ECOG): 1 - Symptomatic but completely ambulatory  PHYSICAL EXAM:   GENERAL:alert, no distress and comfortable SKIN: skin color, texture, turgor are normal, no rashes or significant lesions EYES: normal, Conjunctiva are pink and non-injected, sclera clear OROPHARYNX:no exudate, no erythema and lips, buccal mucosa, and tongue normal  NECK: supple, thyroid normal size, non-tender, without nodularity LYMPH:  no palpable lymphadenopathy in the cervical, axillary or inguinal LUNGS: clear to auscultation and percussion with normal breathing effort HEART: regular rate & rhythm and no murmurs and no lower extremity edema ABDOMEN:abdomen soft, non-tender and normal bowel sounds Musculoskeletal:no cyanosis of digits and no clubbing  NEURO: alert & oriented x 3 with fluent speech, no focal motor/sensory deficits  LABORATORY DATA:  I have reviewed the data as listed    Component Value Date/Time   NA 138 03/01/2021 0000   K 4.0 03/01/2021 0000   CL 107 03/01/2021 0000   CO2 20 03/01/2021 0000   BUN 13 03/01/2021 0000   CREATININE 0.7 03/01/2021 0000   CALCIUM 8.9 03/01/2021 0000   ALBUMIN 4.0 03/01/2021 0000   AST 34 03/01/2021 0000   ALT 30 03/01/2021 0000   ALKPHOS 81 03/01/2021 0000    No results found for: SPEP, UPEP  Lab Results  Component Value Date   WBC 16.2 03/01/2021   NEUTROABS 13.77 03/01/2021   HGB 14.0 03/01/2021   HCT 40 03/01/2021   MCV 94 02/03/2021   PLT 192 03/01/2021      Chemistry      Component Value Date/Time   NA 138 03/01/2021 0000   K 4.0 03/01/2021 0000   CL 107 03/01/2021 0000   CO2 20 03/01/2021 0000   BUN 13 03/01/2021 0000   CREATININE 0.7 03/01/2021 0000    GLU 204 03/01/2021 0000      Component Value Date/Time   CALCIUM 8.9 03/01/2021 0000   ALKPHOS 81 03/01/2021 0000   AST 34 03/01/2021 0000   ALT 30 03/01/2021 0000       RADIOGRAPHIC STUDIES: I have personally reviewed the radiological images as listed and agreed with the findings in the report. ECHOCARDIOGRAM COMPLETE  Result  Date: 02/09/2021    ECHOCARDIOGRAM REPORT   Patient Name:   RICHANDA DARIN Date of Exam: 02/09/2021 Medical Rec #:  [id number]     Height:       65.0 in Accession #:    [id number]    Weight:       198.2 lb Date of Birth:  1955/05/25     BSA:          1.971 m Patient Age:    66 years      BP:           107/59 mmHg Patient Gender: F             HR:           68 bpm. Exam Location:   Procedure: 2D Echo, Cardiac Doppler, Color Doppler and Strain Analysis Indications:    Malignant neoplasm of central portion of left breast in female,                 estrogen receptor positive ([place]) [C50.112, Z17.0 (ICD-10-CM)];                 Encounter for other specified special examinations [Z01.89                 (ICD-10-CM)]  History:        Patient has prior history of Echocardiogram examinations, most                 recent 10/14/2019. Risk Factors:Dyslipidemia.  Sonographer:    Philipp Deputy RDCS Referring Phys: [place] Comments: Image acquisition challenging due to uncooperative patient. Patient having back spasms/pain thoroughout echo study. IMPRESSIONS  1. Left ventricular ejection fraction, by estimation, is 60 to 65%. The left ventricle has normal function. The left ventricle has no regional wall motion abnormalities. There is mild concentric left ventricular hypertrophy. Left ventricular diastolic parameters are consistent with Grade I diastolic dysfunction (impaired relaxation). The average left ventricular global longitudinal strain is -16.6 %.  2. Right ventricular systolic function is normal. The right ventricular size is normal. Tricuspid  regurgitation signal is inadequate for assessing PA pressure.  3. The mitral valve is normal in structure. No evidence of mitral valve regurgitation. No evidence of mitral stenosis.  4. The aortic valve is normal in structure. Aortic valve regurgitation is not visualized. No aortic stenosis is present.  5. The inferior vena cava is normal in size with greater than 50% respiratory variability, suggesting right atrial pressure of 3 mmHg. FINDINGS  Left Ventricle: Left ventricular ejection fraction, by estimation, is 60 to 65%. The left ventricle has normal function. The left ventricle has no regional wall motion abnormalities. The average left ventricular global longitudinal strain is -16.6 %. The left ventricular internal cavity size was normal in size. There is mild concentric left ventricular hypertrophy. Left ventricular diastolic parameters are consistent with Grade I diastolic dysfunction (impaired relaxation). Normal left ventricular filling pressure. Right Ventricle: The right ventricular size is normal. No increase in right ventricular wall thickness. Right ventricular systolic function is normal. Tricuspid regurgitation signal is inadequate for assessing PA pressure. Left Atrium: Left atrial size was normal in size. Right Atrium: Right atrial size was normal in size. Pericardium: There is no evidence of pericardial effusion. Mitral Valve: The mitral valve is normal in structure. No evidence of mitral valve regurgitation. No evidence of mitral valve stenosis. Tricuspid Valve: The tricuspid valve is normal in structure. Tricuspid valve regurgitation is trivial. No evidence  of tricuspid stenosis. Aortic Valve: The aortic valve is normal in structure. Aortic valve regurgitation is not visualized. No aortic stenosis is present. Pulmonic Valve: The pulmonic valve was not well visualized. Aorta: The aortic root and ascending aorta are structurally normal, with no evidence of dilitation and the aortic arch was not  well visualized. Venous: The pulmonary veins were not well visualized. The inferior vena cava is normal in size with greater than 50% respiratory variability, suggesting right atrial pressure of 3 mmHg. IAS/Shunts: No atrial level shunt detected by color flow Doppler.  LEFT VENTRICLE PLAX 2D LVIDd:         4.30 cm Diastology LVIDs:         2.40 cm LV e' medial:  8.27 cm/s LV PW:         1.00 cm LV e' lateral: 9.25 cm/s LV IVS:        1.00 cm                        2D Longitudinal Strain                        2D Strain GLS Avg:     -16.6 % RIGHT VENTRICLE             IVC RV Basal diam:  2.10 cm     IVC diam: 1.60 cm RV S prime:     13.10 cm/s TAPSE (M-mode): 2.5 cm LEFT ATRIUM             Index        RIGHT ATRIUM           Index LA diam:        3.00 cm 1.52 cm/m   RA Area:     12.40 cm LA Vol (A2C):   34.0 ml 17.25 ml/m  RA Volume:   28.30 ml  14.36 ml/m LA Vol (A4C):   20.8 ml 10.56 ml/m LA Biplane Vol: 28.8 ml 14.62 ml/m  AORTIC VALVE LVOT Vmax:   79.00 cm/s LVOT Vmean:  58.500 cm/s LVOT VTI:    0.183 m  AORTA Ao Root diam: 3.30 cm Ao Asc diam:  2.60 cm  SHUNTS Systemic VTI: 0.18 m Shirlee More MD Electronically signed by Shirlee More MD Signature Date/Time: 02/09/2021/5:24:10 PM    Final

## 2021-03-01 ENCOUNTER — Encounter: Payer: Self-pay | Admitting: Hematology and Oncology

## 2021-03-01 ENCOUNTER — Inpatient Hospital Stay (INDEPENDENT_AMBULATORY_CARE_PROVIDER_SITE_OTHER): Payer: Medicare Other | Admitting: Hematology and Oncology

## 2021-03-01 ENCOUNTER — Inpatient Hospital Stay: Payer: Medicare Other

## 2021-03-01 ENCOUNTER — Other Ambulatory Visit: Payer: Self-pay

## 2021-03-01 DIAGNOSIS — C50112 Malignant neoplasm of central portion of left female breast: Secondary | ICD-10-CM | POA: Diagnosis not present

## 2021-03-01 DIAGNOSIS — Z170 Estrogen receptor positive status [ER+]: Secondary | ICD-10-CM

## 2021-03-01 LAB — BASIC METABOLIC PANEL
BUN: 13 (ref 4–21)
CO2: 20 (ref 13–22)
Chloride: 107 (ref 99–108)
Creatinine: 0.7 (ref 0.5–1.1)
Glucose: 204
Potassium: 4 (ref 3.4–5.3)
Sodium: 138 (ref 137–147)

## 2021-03-01 LAB — CBC AND DIFFERENTIAL
HCT: 40 (ref 36–46)
Hemoglobin: 14 (ref 12.0–16.0)
Neutrophils Absolute: 13.77
Platelets: 192 (ref 150–399)
WBC: 16.2

## 2021-03-01 LAB — CBC: RBC: 4.36 (ref 3.87–5.11)

## 2021-03-01 LAB — COMPREHENSIVE METABOLIC PANEL
Albumin: 4 (ref 3.5–5.0)
Calcium: 8.9 (ref 8.7–10.7)

## 2021-03-01 LAB — HEPATIC FUNCTION PANEL
ALT: 30 (ref 7–35)
AST: 34 (ref 13–35)
Alkaline Phosphatase: 81 (ref 25–125)
Bilirubin, Total: 0.5

## 2021-03-01 NOTE — Assessment & Plan Note (Addendum)
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 She has undergone left mastectomy but one node was positive for malignancy. I definitely recommend hormonal therapy. I am concerned about her risk for recurrence in view of the positive node, grade 2 histology, and the low level of estrogen receptors with negative progesterone receptors. EndoPredict has revealed her to be extremely high risk with an EP clin score of 5.4, and so I recommend chemotherapy with AC every 3 weeks followed by weekly Taxol. Her risk of recurrence in the next 10 years is 55% and she has an estimated chemotherapy benefit of 27%. She tolerated cycle 1 fairly well with minimal side effects. She did report a couple of days of feeling "out of it". I asked her about her blood sugars during those days and she did note they were elevated. I advised that she can hold the decadron during the next cycle to see if that helps. She will keep scheduled follow up with Dr. Hinton Rao.

## 2021-03-02 ENCOUNTER — Encounter: Payer: Self-pay | Admitting: Oncology

## 2021-03-03 NOTE — Progress Notes (Signed)
[facility]  [address] [place],  [place]  [address] [phone]  Clinic Day:  03/09/2021  Referring physician: Nonnie Done., MD  This document serves as a record of services personally performed by Gery Pray, MD. It was created on their behalf by [facility], a trained medical scribe. The creation of this record is based on the scribe's personal observations and the provider's statements to them.  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 She has undergone left mastectomy but one node was positive for malignancy. I definitely recommend hormonal therapy. EndoPredict has revealed her to be extremely high risk with an EP clin score of 5.4, and so I recommend chemotherapy with AC every 3 weeks followed by weekly Taxol. Her risk of recurrence in the next 10 years is 55% and she has an estimated chemotherapy benefit of 27%. She is tolerating AC chemotherapy without difficulty.  Significant family history of multiple malignancies as well as thromboses.Genetic testing was negative.   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   She received her 1st dose of AC on February 17th and tolerated without significant difficulty. She will proceed with a 2nd cycle on Friday. We will plan to see her back in 3 weeks prior to her 3rd cycle. Even though her estrogen receptor was low positive, she would still likely benefit from endocrine therapy with an aromatase inhibitor such as anastrozole for a total of 5-10 years, after chemotherapy is completed. She and her family understand and agree with this plan of care. I have answered her questions and she knows to call with any concerns.  I provided 15 minutes of face-to-face time during this this encounter and >  50% was spent counseling as documented under my assessment and plan.    Dellia Beckwith, MD [facility] [address] 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 chemotherapy follow by weekly Taxol   HISTORY OF PRESENT ILLNESS:  Brandy Maxwell is a 66 y.o. female referred by Dr. Terrilee Files for the evaluation and treatment of breast cancer. This was found on screening bilateral mammogram from September 2022 which revealed a possible mass within the left breast. Right breast is without masses. Diagnostic unilateral left mammogram confirmed an architectural distortion at the left breast 12 o'clock. Biopsy was pursed in October and surgical pathology from this procedure revealed invasive mammary carcinoma and mammary carcinoma in situ. HER2 was negative (0). Estrogen receptor was positive at 30% and progesterone receptor was negative at 0%. Ki67 was 1%. She was referred to Dr. Mariella Saa and underwent left mastectomy and sentinel lymph node biopsy on December 16th. Final pathology confirmed invasive ductal carcinoma, grade 2, measuring 7 mm, and ductal carcinoma in situ. All margins were negative. One sentinel lymph nodes was positive for malignancy (1/1) for a T1b N1a M0, stage IIA. She started menarche at age 89. She gave birth to her 1st child at age 79 and her last child at age 4. She went through menopause in her 63s. She was on hormone replacement therapy for a short time.  CT chest, abdomen and pelvis from January 25th revealed status post left mastectomy. There is a fluid attenuation collection in the left mastectomy bed measuring 6.5 x 2.6 cm, likely a postoperative seroma. No evidence of lymphadenopathy or metastatic disease in the chest, abdomen, or pelvis. Hepatic  steatosis. Benign, calcified granulomatous mediastinal lymph nodes and pulmonary nodules. EndoPredict testing revealed and EPclin score of 5.4, which is considered high risk. Her risk for distant recurrence in the next 10 years with endocrine therapy alone is 55%, and she would gain an additional 27% benefit from the addition of chemotherapy. Her risk for late distant recurrence in the next 15 years is 43%.  INTERVAL HISTORY:  I have reviewed her chart and materials related to her cancer extensively and collaborated history with the patient. Summary of oncologic history is as follows: Oncology History  Malignant neoplasm of central portion of left breast in female, estrogen receptor positive ([place])  09/16/2020 Mammogram   DIGITAL SCREENING BILATERAL MAMMOGRAM: In the left breast, a possible mass warrants further evaluation. In the right breast, no findings suspicious for malignancy.    10/08/2020 Mammogram   DIAGNOSTIC UNILATERAL LEFT MAMMOGRAM AND LEFT BREAST ULTRASOUND: There is architectural distortion at the left breast 12 o'clock.  Targeted ultrasound is performed, showing no focal abnormal discrete cystic or solid lesion at left breast 12 o'clock the correlate to the mammographic finding. Ultrasound of the left axilla is negative.   10/20/2020 Pathology Results   Breast, left, needle core biopsy, 12 o'clock:             Invasive mammary carcinoma             Mammary carcinoma in situ HER2: Negative (0) ER: Positive 30% PR: Negative 0% Ki67: 1%   10/26/2020 Initial Diagnosis   Malignant neoplasm of central portion of left breast in female, estrogen receptor positive ([place])   12/17/2020 Cancer Staging   Staging form: Breast, AJCC 8th Edition - Clinical stage from 12/17/2020: Stage IIA (cT1b, cN1(sn), cM0, G2, ER+, PR-, HER2-) - Signed by [name], MD on 02/13/2021 Histopathologic type: Infiltrating duct carcinoma, NOS Stage prefix: Initial diagnosis Method of lymph node  assessment: Sentinel lymph node biopsy Nuclear grade: G2 Multigene prognostic tests performed: EndoPredict Histologic grading system: 3 grade system Laterality: Left Tumor size (mm): 7 Lymph-vascular invasion (LVI): LVI not present (absent)/not identified Diagnostic confirmation: Positive histology Specimen type: Excision Staged by: Managing physician Menopausal status: Postmenopausal Ki-67 (%): 1 EndoPredict EPclin risk score: 5.4 EndoPredict EPclin risk level: High risk EndoPredict 10-year percentage risk of distant recurrence (%): 55 EndoPredict 10-year risk of distant recurrence: High risk Stage used in treatment planning: Yes National guidelines used in treatment planning: Yes Type of national guideline used in treatment planning: NCCN Staging comments: Endopredict with 27% benefit from chemotherapy    12/17/2020 Pathology Results   Breast, simple mastectomy, left:         Invasive ductal carcinoma, grade 2, 7 mm, and ductal carcinoma in situ         Ribbon clip and biopsy reaction present         All margins negative for invasive carcinoma and DCIS         Fibrocystic changes Lymph node, sentinel, biopsy, left axillary:         Tumor present in regional lymph node (1/1)   02/18/2021 -  Chemotherapy   Patient is on Treatment Plan : Breast AC q21 Days / PACLitaxel q7d      Genetic  Testing   Negative genetic testing. No pathogenic variants identified on the [facility] Multi-Cancer+RNA panel. The report date is 03/08/2021.  The Multi-Cancer Panel + RNA offered by [facility] includes sequencing and/or deletion duplication testing of the following 84 genes: AIP, ALK, APC, ATM, AXIN2,BAP1,  BARD1, BLM, BMPR1A, BRCA1, BRCA2, BRIP1, CASR, CDC73, CDH1, CDK4, CDKN1B, CDKN1C, CDKN2A (p14ARF), CDKN2A (p16INK4a), CEBPA, CHEK2, CTNNA1, DICER1, DIS3L2, EGFR (c.2369C>T, p.Thr790Met variant only), EPCAM (Deletion/duplication testing only), FH, FLCN, GATA2, GPC3, GREM1 (Promoter region  deletion/duplication testing only), HOXB13 (c.251G>A, p.Gly84Glu), HRAS, KIT, MAX, MEN1, MET, MITF (c.952G>A, p.Glu318Lys variant only), MLH1, MSH2, MSH3, MSH6, MUTYH, NBN, NF1, NF2, NTHL1, PALB2, PDGFRA, PHOX2B, PMS2, POLD1, POLE, POT1, PRKAR1A, PTCH1, PTEN, RAD50, RAD51C, RAD51D, RB1, RECQL4, RET, RUNX1, SDHAF2, SDHA (sequence changes only), SDHB, SDHC, SDHD, SMAD4, SMARCA4, SMARCB1, SMARCE1, STK11, SUFU, TERC, TERT, TMEM127, TP53, TSC1, TSC2, VHL, WRN and WT1.     Brandy Maxwell is here for routine follow up prior to a 2nd cycle of AC chemotherapy. Because of a high risk EndoPredict score, she was placed on chemotherapy and had her 1st cycle on February 17th and tolerated without significant difficulty other than fatigue, nausea and mouth sores. The mouth sores resolved with Magic Mouthwash, and her other symptoms resolved quickly after. She states that she recently developed a bladder infection and was placed on antibiotics. She has had some hot flashes/sweats. She reports that her bone density scan is normal. Blood counts are unremarkable except for a platelet count of 422,000. Chemistries are unremarkable except for a non-fasting glucose of 235. Her  appetite is good, and she has gained nearly 2 pounds since her last visit.  She denies fever, chills or other signs of infection.  She denies nausea, vomiting, bowel issues, or abdominal pain.  She denies sore throat, cough, dyspnea, or chest pain.  HISTORY:   Allergies:  Allergies  Allergen Reactions   Nitrofurantoin Anaphylaxis   Sulfa Antibiotics Rash, Other (See Comments) and Hives    Other reaction(s): Rash     Current Medications: Current Outpatient Medications  Medication Sig Dispense Refill   dexamethasone (DECADRON) 4 MG tablet Take 2 tablets (8 mg total) by mouth daily. Take daily for 3 days after chemo. Take with food. 30 tablet 1   dicyclomine (BENTYL) 10 MG capsule Take 10 mg by mouth 3 (three) times daily as needed for pain. With IBS      DULoxetine (CYMBALTA) 60 MG capsule Take 60 mg by mouth daily.     EPINEPHrine 0.3 mg/0.3 mL IJ SOAJ injection Inject 0.3 mg into the muscle as needed for anaphylaxis.     ezetimibe (ZETIA) 10 MG tablet Take 10 mg by mouth daily.     fenofibrate 160 MG tablet Take 160 mg by mouth daily.     gabapentin (NEURONTIN) 600 MG tablet Take 1,200 mg by mouth 3 (three) times daily.     lidocaine (LIDODERM) 5 % Place 1 patch onto the skin as needed for pain.     lidocaine (XYLOCAINE) 2 % solution SMARTSIG:By Mouth     LINZESS 72 MCG capsule Take 1 capsule (72 mcg total) by mouth daily. 90 capsule 4   magic mouthwash (lidocaine, diphenhydrAMINE, alum & mag hydroxide) suspension Take 5 mLs by mouth every 3 (three) hours as needed. Swish and spit for mouth or throat pain     metoprolol succinate (TOPROL-XL) 25 MG 24 hr tablet Take 1 tablet by mouth daily.     MYRBETRIQ 50 MG TB24 tablet Take 50 mg by  mouth daily.     ondansetron (ZOFRAN) 8 MG tablet Take 1 tablet (8 mg total) by mouth 2 (two) times daily as needed. Start on the third day after chemotherapy. 30 tablet 1   pramipexole (MIRAPEX) 1 MG tablet Take 0.5 mg by mouth daily.     prochlorperazine (COMPAZINE) 10 MG tablet Take 1 tablet (10 mg total) by mouth every 6 (six) hours as needed (Nausea or vomiting). 30 tablet 1   traMADol (ULTRAM) 50 MG tablet Take 50 mg by mouth 2 (two) times daily as needed for pain.     Vitamin D, Ergocalciferol, (DRISDOL) 1.25 MG (50000 UT) CAPS capsule TAKE 1 CAPSULE BY MOUTH ONCE A WEEK FOR 30 DAYS     No current facility-administered medications for this visit.    REVIEW OF SYSTEMS:  Review of Systems  Constitutional: Negative.  Negative for appetite change, chills, fatigue, fever and unexpected weight change.  HENT:  Negative.    Eyes: Negative.   Respiratory: Negative.  Negative for chest tightness, cough, hemoptysis, shortness of breath and wheezing.   Cardiovascular: Negative.  Negative for chest pain, leg  swelling and palpitations.  Gastrointestinal: Negative.  Negative for abdominal distention, abdominal pain, blood in stool, constipation, diarrhea, nausea and vomiting.  Endocrine: Positive for hot flashes.  Genitourinary: Negative.  Negative for difficulty urinating, dysuria, frequency and hematuria.   Musculoskeletal: Negative.  Negative for arthralgias, back pain, flank pain, gait problem and myalgias.  Skin: Negative.   Neurological: Negative.  Negative for dizziness, extremity weakness, gait problem, headaches, light-headedness, numbness, seizures and speech difficulty.  Hematological: Negative.   Psychiatric/Behavioral: Negative.  Negative for depression and sleep disturbance. The patient is not nervous/anxious.      VITALS:  Blood pressure 120/61, pulse 93, temperature 98.7 F (37.1 C), resp. rate 16, height 5' 3.5" (1.613 m), weight 196 lb 14.4 oz (89.3 kg), SpO2 97 %.  Wt Readings from Last 3 Encounters:  03/09/21 196 lb 14.4 oz (89.3 kg)  03/01/21 195 lb (88.5 kg)  02/21/21 198 lb 12 oz (90.2 kg)    Body mass index is 34.33 kg/m.  Performance status (ECOG): 1 - Symptomatic but completely ambulatory  PHYSICAL EXAM:  Physical Exam Constitutional:      General: She is not in acute distress.    Appearance: Normal appearance. She is normal weight.  HENT:     Head: Normocephalic and atraumatic.  Eyes:     General: No scleral icterus.    Extraocular Movements: Extraocular movements intact.     Conjunctiva/sclera: Conjunctivae normal.     Pupils: Pupils are equal, round, and reactive to light.  Cardiovascular:     Rate and Rhythm: Normal rate and regular rhythm.     Pulses: Normal pulses.     Heart sounds: Normal heart sounds. No murmur heard.   No friction rub. No gallop.  Pulmonary:     Effort: Pulmonary effort is normal. No respiratory distress.     Breath sounds: Normal breath sounds.  Chest:     Comments: Well healed scar of the lateral right breast. No masses in  the breast. Left mastectomy is negative. Abdominal:     General: Bowel sounds are normal. There is no distension.     Palpations: Abdomen is soft. There is no hepatomegaly, splenomegaly or mass.     Tenderness: There is no abdominal tenderness.  Musculoskeletal:        General: Normal range of motion.     Cervical back: Normal range of  motion and neck supple.     Right lower leg: No edema.     Left lower leg: No edema.  Lymphadenopathy:     Cervical: No cervical adenopathy.  Skin:    General: Skin is warm and dry.  Neurological:     General: No focal deficit present.     Mental Status: She is alert and oriented to person, place, and time. Mental status is at baseline.  Psychiatric:        Mood and Affect: Mood normal.        Behavior: Behavior normal.        Thought Content: Thought content normal.        Judgment: Judgment normal.     LABS:   CBC Latest Ref Rng & Units 03/09/2021 03/01/2021 02/16/2021  WBC - 7.0 16.2 5.5  Hemoglobin 12.0 - 16.0 14.1 14.0 13.9  Hematocrit 36 - 46 42 40 41  Platelets 150 - 400 K/uL 422(A) 192 290   CMP Latest Ref Rng & Units 03/09/2021 03/01/2021 02/16/2021  BUN 4 - $R'21 16 13 14  'va$ Creatinine 0.5 - 1.1 0.9 0.7 0.8  Sodium 137 - 147 135(A) 138 140  Potassium 3.5 - 5.1 mEq/L 4.5 4.0 4.1  Chloride 99 - 108 107 107 109(A)  CO2 13 - $Re'22 20 20 'ICP$ 24(A)  Calcium 8.7 - 10.7 9.3 8.9 8.6(A)  Alkaline Phos 25 - 125 55 81 40  AST 13 - 35 36(A) 34 40(A)  ALT 7 - 35 U/L 34 30 30     STUDIES:  ECHOCARDIOGRAM COMPLETE  Result Date: 02/09/2021    ECHOCARDIOGRAM REPORT   Patient Name:   JERSI MCMASTER Date of Exam: 02/09/2021 Medical Rec #:  [id number]     Height:       65.0 in Accession #:    [id number]    Weight:       198.2 lb Date of Birth:  [birth year]     BSA:          1.971 m Patient Age:    88 years      BP:           107/59 mmHg Patient Gender: F             HR:           68 bpm. Exam Location:  [facility] Procedure: 2D Echo, Cardiac Doppler, Color Doppler and  Strain Analysis Indications:    Malignant neoplasm of central portion of left breast in female,                 estrogen receptor positive ([place]) [C50.112, Z17.0 (ICD-10-CM)];                 Encounter for other specified special examinations [Z01.89                 (ICD-10-CM)]  History:        Patient has prior history of Echocardiogram examinations, most                 recent 10/14/2019. Risk Factors:Dyslipidemia.  Sonographer:    Philipp Deputy RDCS Referring Phys: [place] Comments: Image acquisition challenging due to uncooperative patient. Patient having back spasms/pain thoroughout echo study. IMPRESSIONS  1. Left ventricular ejection fraction, by estimation, is 60 to 65%. The left ventricle has normal function. The left ventricle has no regional wall motion abnormalities. There is mild concentric left ventricular hypertrophy. Left ventricular diastolic parameters  are consistent with Grade I diastolic dysfunction (impaired relaxation). The average left ventricular global longitudinal strain is -16.6 %.  2. Right ventricular systolic function is normal. The right ventricular size is normal. Tricuspid regurgitation signal is inadequate for assessing PA pressure.  3. The mitral valve is normal in structure. No evidence of mitral valve regurgitation. No evidence of mitral stenosis.  4. The aortic valve is normal in structure. Aortic valve regurgitation is not visualized. No aortic stenosis is present.  5. The inferior vena cava is normal in size with greater than 50% respiratory variability, suggesting right atrial pressure of 3 mmHg. FINDINGS  Left Ventricle: Left ventricular ejection fraction, by estimation, is 60 to 65%. The left ventricle has normal function. The left ventricle has no regional wall motion abnormalities. The average left ventricular global longitudinal strain is -16.6 %. The left ventricular internal cavity size was normal in size. There is mild concentric left  ventricular hypertrophy. Left ventricular diastolic parameters are consistent with Grade I diastolic dysfunction (impaired relaxation). Normal left ventricular filling pressure. Right Ventricle: The right ventricular size is normal. No increase in right ventricular wall thickness. Right ventricular systolic function is normal. Tricuspid regurgitation signal is inadequate for assessing PA pressure. Left Atrium: Left atrial size was normal in size. Right Atrium: Right atrial size was normal in size. Pericardium: There is no evidence of pericardial effusion. Mitral Valve: The mitral valve is normal in structure. No evidence of mitral valve regurgitation. No evidence of mitral valve stenosis. Tricuspid Valve: The tricuspid valve is normal in structure. Tricuspid valve regurgitation is trivial. No evidence of tricuspid stenosis. Aortic Valve: The aortic valve is normal in structure. Aortic valve regurgitation is not visualized. No aortic stenosis is present. Pulmonic Valve: The pulmonic valve was not well visualized. Aorta: The aortic root and ascending aorta are structurally normal, with no evidence of dilitation and the aortic arch was not well visualized. Venous: The pulmonary veins were not well visualized. The inferior vena cava is normal in size with greater than 50% respiratory variability, suggesting right atrial pressure of 3 mmHg. IAS/Shunts: No atrial level shunt detected by color flow Doppler.  LEFT VENTRICLE PLAX 2D LVIDd:         4.30 cm Diastology LVIDs:         2.40 cm LV e' medial:  8.27 cm/s LV PW:         1.00 cm LV e' lateral: 9.25 cm/s LV IVS:        1.00 cm                        2D Longitudinal Strain                        2D Strain GLS Avg:     -16.6 % RIGHT VENTRICLE             IVC RV Basal diam:  2.10 cm     IVC diam: 1.60 cm RV S prime:     13.10 cm/s TAPSE (M-mode): 2.5 cm LEFT ATRIUM             Index        RIGHT ATRIUM           Index LA diam:        3.00 cm 1.52 cm/m   RA Area:      12.40 cm LA Vol (A2C):   34.0 ml 17.25 ml/m  RA  Volume:   28.30 ml  14.36 ml/m LA Vol (A4C):   20.8 ml 10.56 ml/m LA Biplane Vol: 28.8 ml 14.62 ml/m  AORTIC VALVE LVOT Vmax:   79.00 cm/s LVOT Vmean:  58.500 cm/s LVOT VTI:    0.183 m  AORTA Ao Root diam: 3.30 cm Ao Asc diam:  2.60 cm  SHUNTS Systemic VTI: 0.18 m Shirlee More MD Electronically signed by Shirlee More MD Signature Date/Time: 02/09/2021/5:24:10 PM    Final        I, Rita Ohara, am acting as scribe for [name], MD  I have reviewed this report as typed by the medical scribe, and it is complete and accurate.

## 2021-03-08 ENCOUNTER — Encounter: Payer: Self-pay | Admitting: Licensed Clinical Social Worker

## 2021-03-08 ENCOUNTER — Telehealth: Payer: Self-pay | Admitting: Licensed Clinical Social Worker

## 2021-03-09 ENCOUNTER — Inpatient Hospital Stay: Payer: Medicare Other | Attending: Oncology | Admitting: Oncology

## 2021-03-09 ENCOUNTER — Encounter: Payer: Self-pay | Admitting: Oncology

## 2021-03-09 ENCOUNTER — Encounter: Payer: Self-pay | Admitting: Licensed Clinical Social Worker

## 2021-03-09 ENCOUNTER — Ambulatory Visit: Payer: Self-pay | Admitting: Licensed Clinical Social Worker

## 2021-03-09 ENCOUNTER — Other Ambulatory Visit: Payer: Self-pay

## 2021-03-09 ENCOUNTER — Inpatient Hospital Stay: Payer: Medicare Other

## 2021-03-09 ENCOUNTER — Telehealth: Payer: Self-pay

## 2021-03-09 VITALS — BP 120/61 | HR 93 | Temp 98.7°F | Resp 16 | Ht 63.5 in | Wt 196.9 lb

## 2021-03-09 DIAGNOSIS — C50112 Malignant neoplasm of central portion of left female breast: Secondary | ICD-10-CM

## 2021-03-09 DIAGNOSIS — Z79899 Other long term (current) drug therapy: Secondary | ICD-10-CM | POA: Insufficient documentation

## 2021-03-09 DIAGNOSIS — Z170 Estrogen receptor positive status [ER+]: Secondary | ICD-10-CM | POA: Insufficient documentation

## 2021-03-09 DIAGNOSIS — Z5111 Encounter for antineoplastic chemotherapy: Secondary | ICD-10-CM | POA: Insufficient documentation

## 2021-03-09 DIAGNOSIS — Z9012 Acquired absence of left breast and nipple: Secondary | ICD-10-CM | POA: Insufficient documentation

## 2021-03-09 DIAGNOSIS — Z1379 Encounter for other screening for genetic and chromosomal anomalies: Secondary | ICD-10-CM

## 2021-03-09 DIAGNOSIS — C773 Secondary and unspecified malignant neoplasm of axilla and upper limb lymph nodes: Secondary | ICD-10-CM

## 2021-03-09 DIAGNOSIS — Z5189 Encounter for other specified aftercare: Secondary | ICD-10-CM | POA: Insufficient documentation

## 2021-03-09 HISTORY — DX: Encounter for other screening for genetic and chromosomal anomalies: Z13.79

## 2021-03-09 LAB — CBC: RBC: 4.43 (ref 3.87–5.11)

## 2021-03-09 LAB — BASIC METABOLIC PANEL
BUN: 16 (ref 4–21)
CO2: 20 (ref 13–22)
Chloride: 107 (ref 99–108)
Creatinine: 0.9 (ref 0.5–1.1)
Glucose: 235
Potassium: 4.5 mEq/L (ref 3.5–5.1)
Sodium: 135 — AB (ref 137–147)

## 2021-03-09 LAB — HEPATIC FUNCTION PANEL
ALT: 34 U/L (ref 7–35)
AST: 36 — AB (ref 13–35)
Alkaline Phosphatase: 55 (ref 25–125)
Bilirubin, Total: 0.6

## 2021-03-09 LAB — CBC AND DIFFERENTIAL
HCT: 42 (ref 36–46)
Hemoglobin: 14.1 (ref 12.0–16.0)
Neutrophils Absolute: 4.13
Platelets: 422 10*3/uL — AB (ref 150–400)
WBC: 7

## 2021-03-09 LAB — COMPREHENSIVE METABOLIC PANEL
Albumin: 4 (ref 3.5–5.0)
Calcium: 9.3 (ref 8.7–10.7)

## 2021-03-09 NOTE — Telephone Encounter (Signed)
Secure chat sent to Dr Hinton Rao this morning (03/09/2021), as she has appt w/pt this afternoon.  When pt went for botox f/u visit, they performed UA & sent for culture. It was positive for klebsiella - she was placed on antibiotic.  ?

## 2021-03-09 NOTE — Progress Notes (Signed)
HPI:  Ms. Athens was previously seen in the [place] clinic due to a personal and family history of cancer and concerns regarding a hereditary predisposition to cancer. Please refer to our prior cancer genetics clinic note for more information regarding our discussion, assessment and recommendations, at the time. Ms. Edinger recent genetic test results were disclosed to her, as were recommendations warranted by these results. These results and recommendations are discussed in more detail below.  CANCER HISTORY:  Oncology History  Malignant neoplasm of central portion of left breast in female, estrogen receptor positive ([place])  09/16/2020 Mammogram   DIGITAL SCREENING BILATERAL MAMMOGRAM: In the left breast, a possible mass warrants further evaluation. In the right breast, no findings suspicious for malignancy.    10/08/2020 Mammogram   DIAGNOSTIC UNILATERAL LEFT MAMMOGRAM AND LEFT BREAST ULTRASOUND: There is architectural distortion at the left breast 12 o'clock.  Targeted ultrasound is performed, showing no focal abnormal discrete cystic or solid lesion at left breast 12 o'clock the correlate to the mammographic finding. Ultrasound of the left axilla is negative.   10/20/2020 Pathology Results   Breast, left, needle core biopsy, 12 o'clock:             Invasive mammary carcinoma             Mammary carcinoma in situ HER2: Negative (0) ER: Positive 30% PR: Negative 0% Ki67: 1%   10/26/2020 Initial Diagnosis   Malignant neoplasm of central portion of left breast in female, estrogen receptor positive ([place])   12/17/2020 Cancer Staging   Staging form: Breast, AJCC 8th Edition - Clinical stage from 12/17/2020: Stage IIA (cT1b, cN1(sn), cM0, G2, ER+, PR-, HER2-) - Signed by [name], MD on 02/13/2021 Histopathologic type: Infiltrating duct carcinoma, NOS Stage prefix: Initial diagnosis Method of lymph node assessment: Sentinel lymph node biopsy Nuclear grade:  G2 Multigene prognostic tests performed: EndoPredict Histologic grading system: 3 grade system Laterality: Left Tumor size (mm): 7 Lymph-vascular invasion (LVI): LVI not present (absent)/not identified Diagnostic confirmation: Positive histology Specimen type: Excision Staged by: Managing physician Menopausal status: Postmenopausal Ki-67 (%): 1 EndoPredict EPclin risk score: 5.4 EndoPredict EPclin risk level: High risk EndoPredict 10-year percentage risk of distant recurrence (%): 55 EndoPredict 10-year risk of distant recurrence: High risk Stage used in treatment planning: Yes National guidelines used in treatment planning: Yes Type of national guideline used in treatment planning: NCCN Staging comments: Endopredict with 27% benefit from chemotherapy    12/17/2020 Pathology Results   Breast, simple mastectomy, left:         Invasive ductal carcinoma, grade 2, 7 mm, and ductal carcinoma in situ         Ribbon clip and biopsy reaction present         All margins negative for invasive carcinoma and DCIS         Fibrocystic changes Lymph node, sentinel, biopsy, left axillary:         Tumor present in regional lymph node (1/1)   02/18/2021 -  Chemotherapy   Patient is on Treatment Plan : Breast AC q21 Days / PACLitaxel q7d      Genetic Testing   Negative genetic testing. No pathogenic variants identified on the [facility] Multi-Cancer+RNA panel. The report date is 03/08/2021.  The Multi-Cancer Panel + RNA offered by [facility] includes sequencing and/or deletion duplication testing of the following 84 genes: AIP, ALK, APC, ATM, AXIN2,BAP1,  BARD1, BLM, BMPR1A, BRCA1, BRCA2, BRIP1, CASR, CDC73, CDH1, CDK4, CDKN1B, CDKN1C, CDKN2A (  p14ARF), CDKN2A (p16INK4a), CEBPA, CHEK2, CTNNA1, DICER1, DIS3L2, EGFR (c.2369C>T, p.Thr790Met variant only), EPCAM (Deletion/duplication testing only), FH, FLCN, GATA2, GPC3, GREM1 (Promoter region deletion/duplication testing only), HOXB13 (c.251G>A, p.Gly84Glu),  HRAS, KIT, MAX, MEN1, MET, MITF (c.952G>A, p.Glu318Lys variant only), MLH1, MSH2, MSH3, MSH6, MUTYH, NBN, NF1, NF2, NTHL1, PALB2, PDGFRA, PHOX2B, PMS2, POLD1, POLE, POT1, PRKAR1A, PTCH1, PTEN, RAD50, RAD51C, RAD51D, RB1, RECQL4, RET, RUNX1, SDHAF2, SDHA (sequence changes only), SDHB, SDHC, SDHD, SMAD4, SMARCA4, SMARCB1, SMARCE1, STK11, SUFU, TERC, TERT, TMEM127, TP53, TSC1, TSC2, VHL, WRN and WT1.     FAMILY HISTORY:  We obtained a detailed, 4-generation family history.  Significant diagnoses are listed below: Family History  Problem Relation Age of Onset   Hypercholesterolemia Mother    Alzheimer's disease Mother    Heart Problems Father    Clotting disorder Father    Alzheimer's disease Father    Prostate cancer Father        dx 54s   Allergic rhinitis Sister    Uterine cancer Maternal Aunt        dx 89s, d. 58   Breast cancer Maternal Aunt 60       recurrence, metastatic, d. 37   Hemophilia Maternal Aunt    Breast cancer Maternal Aunt        dx 4-s d. 42s   Lung cancer Maternal Uncle 50       smoker   Lung cancer Maternal Uncle        16s   Breast cancer Paternal Aunt 20       dx 35s d. 22, 3 recurrences   Liver cancer Paternal Uncle    Kidney cancer Paternal Uncle    Colon cancer Maternal Grandfather        30s   Brain cancer Paternal Grandfather        45s   Colon cancer Cousin    Breast cancer Cousin        dx 62s, bilateral mastectomy   Esophageal cancer Neg Hx    Stomach cancer Neg Hx    Rectal cancer Neg Hx     Ms. Heffler has 5 sons, 1 daughter, no cancers. She has 2 sisters and 1 brother, no cancer.   Ms. Curenton mother died at 64 of kidney disease. Patient had 4 maternal aunts, 4 maternal uncles. One aunt had breast cancer in her 110s and died at 19 after recurrence. Another aunt had breast cancer in her 36s and died in her 60s. An aunt had uterine cancer in her 29s-50s and died at 60, she had a daughter that has colon cancer. Two maternal uncles had lung  cancer, both had history of smoking. A cousin had brain cancer and died at 52, and another cousin had bone cancer and passed from it. Maternal grandmother died at 34, grandfather died at 46 and had colon cancer in his 35s.    Ms. Lenig father had prostate cancer in his 84s and died at 20. Patient had 2 paternal aunts, 2 paternal uncles. One aunt had breast cancer in her 68s, three recurrences, and died at 43. Her daughter had breast cancer in her 37s and had bilateral mastectomy, unknown if she had genetic testing. A paternal uncle had liver/kidney cancer and died at 36. Paternal grandfather had brain cancer in his 78s and died at 11. Grandmother died at 75.   Ms. Nellums is unaware of previous family history of genetic testing for hereditary cancer risks. Patient's maternal ancestors are of [place], [place], [place] descent, and  paternal ancestors are of [place], [place] descent. There is reported Ashkenazi Jewish ancestry, small percentage on 23 and me/ancestry testing. There is no known consanguinity.      GENETIC TEST RESULTS: Genetic testing reported out on 03/08/2021 through the [facility] Multi-Cancer+RNA cancer panel found no pathogenic mutations.   The Multi-Cancer Panel + RNA offered by [facility] includes sequencing and/or deletion duplication testing of the following 84 genes: AIP, ALK, APC, ATM, AXIN2,BAP1,  BARD1, BLM, BMPR1A, BRCA1, BRCA2, BRIP1, CASR, CDC73, CDH1, CDK4, CDKN1B, CDKN1C, CDKN2A (p14ARF), CDKN2A (p16INK4a), CEBPA, CHEK2, CTNNA1, DICER1, DIS3L2, EGFR (c.2369C>T, p.Thr790Met variant only), EPCAM (Deletion/duplication testing only), FH, FLCN, GATA2, GPC3, GREM1 (Promoter region deletion/duplication testing only), HOXB13 (c.251G>A, p.Gly84Glu), HRAS, KIT, MAX, MEN1, MET, MITF (c.952G>A, p.Glu318Lys variant only), MLH1, MSH2, MSH3, MSH6, MUTYH, NBN, NF1, NF2, NTHL1, PALB2, PDGFRA, PHOX2B, PMS2, POLD1, POLE, POT1, PRKAR1A, PTCH1, PTEN, RAD50, RAD51C, RAD51D, RB1, RECQL4, RET, RUNX1,  SDHAF2, SDHA (sequence changes only), SDHB, SDHC, SDHD, SMAD4, SMARCA4, SMARCB1, SMARCE1, STK11, SUFU, TERC, TERT, TMEM127, TP53, TSC1, TSC2, VHL, WRN and WT1.   The test report has been scanned into EPIC and is located under the Molecular Pathology section of the Results Review tab.  A portion of the result report is included below for reference.     We discussed that because current genetic testing is not perfect, it is possible there may be a gene mutation in one of these genes that current testing cannot detect, but that chance is small.  There could be another gene that has not yet been discovered, or that we have not yet tested, that is responsible for the cancer diagnoses in the family. It is also possible there is a hereditary cause for the cancer in the family that Ms. Cloke did not inherit and therefore was not identified in her testing.  Therefore, it is important to remain in touch with cancer genetics in the future so that we can continue to offer Ms. Kamm the most up to date genetic testing.   ADDITIONAL GENETIC TESTING: We discussed with Ms. Larabee that her genetic testing was fairly extensive.  If there are genes identified to increase cancer risk that can be analyzed in the future, we would be happy to discuss and coordinate this testing at that time.    CANCER SCREENING RECOMMENDATIONS: Ms. Ramey test result is considered negative (normal).  This means that we have not identified a hereditary cause for her  personal and family history of cancer at this time. Most cancers happen by chance and this negative test suggests that her cancer may fall into this category.    While reassuring, this does not definitively rule out a hereditary predisposition to cancer. It is still possible that there could be genetic mutations that are undetectable by current technology. There could be genetic mutations in genes that have not been tested or identified to increase cancer risk.  Therefore, it  is recommended she continue to follow the cancer management and screening guidelines provided by her oncology and primary healthcare provider.   An individual's cancer risk and medical management are not determined by genetic test results alone. Overall cancer risk assessment incorporates additional factors, including personal medical history, family history, and any available genetic information that may result in a personalized plan for cancer prevention and surveillance.  RECOMMENDATIONS FOR FAMILY MEMBERS:  Relatives in this family might be at some increased risk of developing cancer, over the general population risk, simply due to the family history of cancer.  We recommended  female relatives in this family have a yearly mammogram beginning at age 15, or 54 years younger than the earliest onset of cancer, an annual clinical breast exam, and perform monthly breast self-exams. Female relatives in this family should also have a gynecological exam as recommended by their primary provider.  All family members should be referred for colonoscopy starting at age 52.    It is also possible there is a hereditary cause for the cancer in Ms. Kujala's family that she did not inherit and therefore was not identified in her.  Based on Ms. Kempfer's family history, we recommended family members on both sides, especially those with cancer/closely related to those with cancer, have genetic counseling and testing. Ms. Braaten will let us know if we can be of any assistance in coordinating genetic counseling and/or testing for these family members.  FOLLOW-UP: Lastly, we discussed with Ms. Holmes that cancer genetics is a rapidly advancing field and it is possible that new genetic tests will be appropriate for her and/or her family members in the future. We encouraged her to remain in contact with cancer genetics on an annual basis so we can update her personal and family histories and let her know of advances in cancer  genetics that may benefit this family.   Our contact number was provided. Ms. Pumphrey questions were answered to her satisfaction, and she knows she is welcome to call us at anytime with additional questions or concerns.   Faith Rogue, MS, [facility] Genetic Counselor [email or web address] Phone: [phone]

## 2021-03-09 NOTE — Telephone Encounter (Signed)
Revealed negative genetic testing. This normal result is reassuring and indicates that it is unlikely Brandy Maxwell's cancer is due to a hereditary cause.  It is unlikely that there is an increased risk of another cancer due to a mutation in one of these genes.  However, genetic testing is not perfect, and cannot definitively rule out a hereditary cause.  It will be important for her to keep in contact with genetics to learn if any additional testing may be needed in the future.    ? ?

## 2021-03-10 MED FILL — Doxorubicin HCl Inj 2 MG/ML: INTRAVENOUS | Qty: 61 | Status: AC

## 2021-03-10 MED FILL — Cyclophosphamide For Inj 1 GM: INTRAMUSCULAR | Qty: 61 | Status: AC

## 2021-03-10 MED FILL — Dexamethasone Sodium Phosphate Inj 100 MG/10ML: INTRAMUSCULAR | Qty: 1 | Status: AC

## 2021-03-10 MED FILL — Fosaprepitant Dimeglumine For IV Infusion 150 MG (Base Eq): INTRAVENOUS | Qty: 5 | Status: AC

## 2021-03-11 ENCOUNTER — Inpatient Hospital Stay: Payer: Medicare Other

## 2021-03-11 VITALS — BP 111/62 | HR 88 | Temp 98.2°F | Resp 16 | Wt 196.6 lb

## 2021-03-11 DIAGNOSIS — C50112 Malignant neoplasm of central portion of left female breast: Secondary | ICD-10-CM | POA: Diagnosis present

## 2021-03-11 DIAGNOSIS — Z170 Estrogen receptor positive status [ER+]: Secondary | ICD-10-CM | POA: Diagnosis not present

## 2021-03-11 DIAGNOSIS — Z79899 Other long term (current) drug therapy: Secondary | ICD-10-CM | POA: Diagnosis not present

## 2021-03-11 DIAGNOSIS — Z9012 Acquired absence of left breast and nipple: Secondary | ICD-10-CM | POA: Diagnosis not present

## 2021-03-11 DIAGNOSIS — Z5111 Encounter for antineoplastic chemotherapy: Secondary | ICD-10-CM | POA: Diagnosis present

## 2021-03-11 DIAGNOSIS — Z5189 Encounter for other specified aftercare: Secondary | ICD-10-CM | POA: Diagnosis not present

## 2021-03-11 MED ORDER — SODIUM CHLORIDE 0.9 % IV SOLN
10.0000 mg | Freq: Once | INTRAVENOUS | Status: AC
  Administered 2021-03-11: 10 mg via INTRAVENOUS
  Filled 2021-03-11: qty 10

## 2021-03-11 MED ORDER — HEPARIN SOD (PORK) LOCK FLUSH 100 UNIT/ML IV SOLN
500.0000 [IU] | Freq: Once | INTRAVENOUS | Status: AC | PRN
  Administered 2021-03-11: 500 [IU]

## 2021-03-11 MED ORDER — SODIUM CHLORIDE 0.9 % IV SOLN: Freq: Once | INTRAVENOUS | Status: AC

## 2021-03-11 MED ORDER — SODIUM CHLORIDE 0.9 % IV SOLN
600.0000 mg/m2 | Freq: Once | INTRAVENOUS | Status: AC
  Administered 2021-03-11: 1220 mg via INTRAVENOUS
  Filled 2021-03-11: qty 61

## 2021-03-11 MED ORDER — SODIUM CHLORIDE 0.9% FLUSH
10.0000 mL | INTRAVENOUS | Status: DC | PRN
  Administered 2021-03-11: 10 mL

## 2021-03-11 MED ORDER — PALONOSETRON HCL INJECTION 0.25 MG/5ML
0.2500 mg | Freq: Once | INTRAVENOUS | Status: AC
  Administered 2021-03-11: 0.25 mg via INTRAVENOUS
  Filled 2021-03-11: qty 5

## 2021-03-11 MED ORDER — SODIUM CHLORIDE 0.9 % IV SOLN
150.0000 mg | Freq: Once | INTRAVENOUS | Status: AC
  Administered 2021-03-11: 150 mg via INTRAVENOUS
  Filled 2021-03-11: qty 150

## 2021-03-11 MED ORDER — DOXORUBICIN HCL CHEMO IV INJECTION 2 MG/ML
60.0000 mg/m2 | Freq: Once | INTRAVENOUS | Status: AC
  Administered 2021-03-11: 122 mg via INTRAVENOUS
  Filled 2021-03-11: qty 61

## 2021-03-11 NOTE — Progress Notes (Signed)
1242:PT STABLE AT TIME OF DISCHARGE °

## 2021-03-11 NOTE — Patient Instructions (Signed)
Cyclophosphamide Injection What is this medication? CYCLOPHOSPHAMIDE (sye kloe FOSS fa mide) is a chemotherapy drug. It slows the growth of cancer cells. This medicine is used to treat many types of cancer like lymphoma, myeloma, leukemia, breast cancer, and ovarian cancer, to name a few. This medicine may be used for other purposes; ask your health care provider or pharmacist if you have questions. COMMON BRAND NAME(S): Cytoxan, Neosar What should I tell my care team before I take this medication? They need to know if you have any of these conditions: heart disease history of irregular heartbeat infection kidney disease liver disease low blood counts, like white cells, platelets, or red blood cells on hemodialysis recent or ongoing radiation therapy scarring or thickening of the lungs trouble passing urine an unusual or allergic reaction to cyclophosphamide, other medicines, foods, dyes, or preservatives pregnant or trying to get pregnant breast-feeding How should I use this medication? This drug is usually given as an injection into a vein or muscle or by infusion into a vein. It is administered in a hospital or clinic by a specially trained health care professional. Talk to your pediatrician regarding the use of this medicine in children. Special care may be needed. Overdosage: If you think you have taken too much of this medicine contact a poison control center or emergency room at once. NOTE: This medicine is only for you. Do not share this medicine with others. What if I miss a dose? It is important not to miss your dose. Call your doctor or health care professional if you are unable to keep an appointment. What may interact with this medication? amphotericin B azathioprine certain antivirals for HIV or hepatitis certain medicines for blood pressure, heart disease, irregular heart beat certain medicines that treat or prevent blood clots like warfarin certain other medicines for  cancer cyclosporine etanercept indomethacin medicines that relax muscles for surgery medicines to increase blood counts metronidazole This list may not describe all possible interactions. Give your health care provider a list of all the medicines, herbs, non-prescription drugs, or dietary supplements you use. Also tell them if you smoke, drink alcohol, or use illegal drugs. Some items may interact with your medicine. What should I watch for while using this medication? Your condition will be monitored carefully while you are receiving this medicine. You may need blood work done while you are taking this medicine. Drink water or other fluids as directed. Urinate often, even at night. Some products may contain alcohol. Ask your health care professional if this medicine contains alcohol. Be sure to tell all health care professionals you are taking this medicine. Certain medicines, like metronidazole and disulfiram, can cause an unpleasant reaction when taken with alcohol. The reaction includes flushing, headache, nausea, vomiting, sweating, and increased thirst. The reaction can last from 30 minutes to several hours. Do not become pregnant while taking this medicine or for 1 year after stopping it. Women should inform their health care professional if they wish to become pregnant or think they might be pregnant. Men should not father a child while taking this medicine and for 4 months after stopping it. There is potential for serious side effects to an unborn child. Talk to your health care professional for more information. Do not breast-feed an infant while taking this medicine or for 1 week after stopping it. This medicine has caused ovarian failure in some women. This medicine may make it more difficult to get pregnant. Talk to your health care professional if you are concerned  about your fertility. This medicine has caused decreased sperm counts in some men. This may make it more difficult to  father a child. Talk to your health care professional if you are concerned about your fertility. Call your health care professional for advice if you get a fever, chills, or sore throat, or other symptoms of a cold or flu. Do not treat yourself. This medicine decreases your body's ability to fight infections. Try to avoid being around people who are sick. Avoid taking medicines that contain aspirin, acetaminophen, ibuprofen, naproxen, or ketoprofen unless instructed by your health care professional. These medicines may hide a fever. Talk to your health care professional about your risk of cancer. You may be more at risk for certain types of cancer if you take this medicine. If you are going to need surgery or other procedure, tell your health care professional that you are using this medicine. Be careful brushing or flossing your teeth or using a toothpick because you may get an infection or bleed more easily. If you have any dental work done, tell your dentist you are receiving this medicine. What side effects may I notice from receiving this medication? Side effects that you should report to your doctor or health care professional as soon as possible: allergic reactions like skin rash, itching or hives, swelling of the face, lips, or tongue breathing problems nausea, vomiting signs and symptoms of bleeding such as bloody or black, tarry stools; red or dark brown urine; spitting up blood or brown material that looks like coffee grounds; red spots on the skin; unusual bruising or bleeding from the eyes, gums, or nose signs and symptoms of heart failure like fast, irregular heartbeat, sudden weight gain; swelling of the ankles, feet, hands signs and symptoms of infection like fever; chills; cough; sore throat; pain or trouble passing urine signs and symptoms of kidney injury like trouble passing urine or change in the amount of urine signs and symptoms of liver injury like dark yellow or brown urine;  general ill feeling or flu-like symptoms; light-colored stools; loss of appetite; nausea; right upper belly pain; unusually weak or tired; yellowing of the eyes or skin Side effects that usually do not require medical attention (report to your doctor or health care professional if they continue or are bothersome): confusion decreased hearing diarrhea facial flushing hair loss headache loss of appetite missed menstrual periods signs and symptoms of low red blood cells or anemia such as unusually weak or tired; feeling faint or lightheaded; falls skin discoloration This list may not describe all possible side effects. Call your doctor for medical advice about side effects. You may report side effects to FDA at 1-800-FDA-1088. Where should I keep my medication? This drug is given in a hospital or clinic and will not be stored at home. NOTE: This sheet is a summary. It may not cover all possible information. If you have questions about this medicine, talk to your doctor, pharmacist, or health care provider.  2022 Elsevier/Gold Standard (2020-09-07 00:00:00) Doxorubicin injection What is this medication? DOXORUBICIN (dox oh ROO bi sin) is a chemotherapy drug. It is used to treat many kinds of cancer like leukemia, lymphoma, neuroblastoma, sarcoma, and Wilms' tumor. It is also used to treat bladder cancer, breast cancer, lung cancer, ovarian cancer, stomach cancer, and thyroid cancer. This medicine may be used for other purposes; ask your health care provider or pharmacist if you have questions. COMMON BRAND NAME(S): Adriamycin, Adriamycin PFS, Adriamycin RDF, Rubex What should I tell  my care team before I take this medication? They need to know if you have any of these conditions: heart disease history of low blood counts caused by a medicine liver disease recent or ongoing radiation therapy an unusual or allergic reaction to doxorubicin, other chemotherapy agents, other medicines, foods,  dyes, or preservatives pregnant or trying to get pregnant breast-feeding How should I use this medication? This drug is given as an infusion into a vein. It is administered in a hospital or clinic by a specially trained health care professional. If you have pain, swelling, burning or any unusual feeling around the site of your injection, tell your health care professional right away. Talk to your pediatrician regarding the use of this medicine in children. Special care may be needed. Overdosage: If you think you have taken too much of this medicine contact a poison control center or emergency room at once. NOTE: This medicine is only for you. Do not share this medicine with others. What if I miss a dose? It is important not to miss your dose. Call your doctor or health care professional if you are unable to keep an appointment. What may interact with this medication? This medicine may interact with the following medications: 6-mercaptopurine paclitaxel phenytoin St. John's Wort trastuzumab verapamil This list may not describe all possible interactions. Give your health care provider a list of all the medicines, herbs, non-prescription drugs, or dietary supplements you use. Also tell them if you smoke, drink alcohol, or use illegal drugs. Some items may interact with your medicine. What should I watch for while using this medication? This drug may make you feel generally unwell. This is not uncommon, as chemotherapy can affect healthy cells as well as cancer cells. Report any side effects. Continue your course of treatment even though you feel ill unless your doctor tells you to stop. There is a maximum amount of this medicine you should receive throughout your life. The amount depends on the medical condition being treated and your overall health. Your doctor will watch how much of this medicine you receive in your lifetime. Tell your doctor if you have taken this medicine before. You may need  blood work done while you are taking this medicine. Your urine may turn red for a few days after your dose. This is not blood. If your urine is dark or brown, call your doctor. In some cases, you may be given additional medicines to help with side effects. Follow all directions for their use. Call your doctor or health care professional for advice if you get a fever, chills or sore throat, or other symptoms of a cold or flu. Do not treat yourself. This drug decreases your body's ability to fight infections. Try to avoid being around people who are sick. This medicine may increase your risk to bruise or bleed. Call your doctor or health care professional if you notice any unusual bleeding. Talk to your doctor about your risk of cancer. You may be more at risk for certain types of cancers if you take this medicine. Do not become pregnant while taking this medicine or for 6 months after stopping it. Women should inform their doctor if they wish to become pregnant or think they might be pregnant. Men should not father a child while taking this medicine and for 6 months after stopping it. There is a potential for serious side effects to an unborn child. Talk to your health care professional or pharmacist for more information. Do not breast-feed an  infant while taking this medicine. This medicine has caused ovarian failure in some women and reduced sperm counts in some men This medicine may interfere with the ability to have a child. Talk with your doctor or health care professional if you are concerned about your fertility. This medicine may cause a decrease in Co-Enzyme Q-10. You should make sure that you get enough Co-Enzyme Q-10 while you are taking this medicine. Discuss the foods you eat and the vitamins you take with your health care professional. What side effects may I notice from receiving this medication? Side effects that you should report to your doctor or health care professional as soon as  possible: allergic reactions like skin rash, itching or hives, swelling of the face, lips, or tongue breathing problems chest pain fast or irregular heartbeat low blood counts - this medicine may decrease the number of white blood cells, red blood cells and platelets. You may be at increased risk for infections and bleeding. pain, redness, or irritation at site where injected signs of infection - fever or chills, cough, sore throat, pain or difficulty passing urine signs of decreased platelets or bleeding - bruising, pinpoint red spots on the skin, black, tarry stools, blood in the urine swelling of the ankles, feet, hands tiredness weakness Side effects that usually do not require medical attention (report to your doctor or health care professional if they continue or are bothersome): diarrhea hair loss mouth sores nail discoloration or damage nausea red colored urine vomiting This list may not describe all possible side effects. Call your doctor for medical advice about side effects. You may report side effects to FDA at 1-800-FDA-1088. Where should I keep my medication? This drug is given in a hospital or clinic and will not be stored at home. NOTE: This sheet is a summary. It may not cover all possible information. If you have questions about this medicine, talk to your doctor, pharmacist, or health care provider.  2022 Elsevier/Gold Standard (2016-08-24 00:00:00)

## 2021-03-14 ENCOUNTER — Inpatient Hospital Stay: Payer: Medicare Other

## 2021-03-14 ENCOUNTER — Other Ambulatory Visit: Payer: Self-pay | Admitting: Hematology and Oncology

## 2021-03-14 ENCOUNTER — Encounter: Payer: Self-pay | Admitting: Oncology

## 2021-03-14 ENCOUNTER — Other Ambulatory Visit: Payer: Self-pay

## 2021-03-14 VITALS — BP 122/88 | HR 76 | Temp 99.0°F | Resp 18 | Ht 65.16 in | Wt 200.2 lb

## 2021-03-14 DIAGNOSIS — C50112 Malignant neoplasm of central portion of left female breast: Secondary | ICD-10-CM

## 2021-03-14 DIAGNOSIS — Z5111 Encounter for antineoplastic chemotherapy: Secondary | ICD-10-CM | POA: Diagnosis not present

## 2021-03-14 MED ORDER — PEGFILGRASTIM-CBQV 6 MG/0.6ML ~~LOC~~ SOSY
6.0000 mg | PREFILLED_SYRINGE | Freq: Once | SUBCUTANEOUS | Status: AC
  Administered 2021-03-14: 6 mg via SUBCUTANEOUS
  Filled 2021-03-14: qty 0.6

## 2021-03-14 MED ORDER — FLUCONAZOLE 150 MG PO TABS: 150.0000 mg | ORAL_TABLET | Freq: Every day | ORAL | 2 refills | Status: DC

## 2021-03-14 NOTE — Progress Notes (Signed)
1515-pt notified that [employer], np called her in diflucan 2 tablets.  She is to take the first tablet today and then the next in 72 hrs.  Pt verbalizes understanding.   ?

## 2021-03-14 NOTE — Patient Instructions (Signed)

## 2021-03-23 ENCOUNTER — Telehealth: Payer: Self-pay

## 2021-03-23 ENCOUNTER — Other Ambulatory Visit: Payer: Self-pay | Admitting: Hematology and Oncology

## 2021-03-23 DIAGNOSIS — Z170 Estrogen receptor positive status [ER+]: Secondary | ICD-10-CM

## 2021-03-23 NOTE — Telephone Encounter (Addendum)
03/23/21 1425 - Pt notified she needs to come in & give a new urine specimen. She will come tomorrow. I also told her that Melissa,NP, recommends she get in touch with her urologist for repeat infections. Pt verbalized understanding. ? ? ? ?03/23/2021 0934  -She completed the 2 days of Diflucan you ordered last week. Itching has subsided. "I have had tremendous pain and discomfort the last 2 days. I have had history of frequent UTI's, but pain hasn't been like this". Below is the culture she had done @ [facility]. They initially gave her Omnicef '300mg'$  BID for 7 days on 03/02/21, but once culture back they switched her to Cipro '250mg'$  BID for 5 days. Please advise. Above message sent to Rhunette Croft, @ [phone] 03/23/2021-awc ? ? ? ?[place] ?Outside Information ?  ? Contains abnormal data Culture, Urine Urology ?Specimen:  Urine - Urethral structure (body structure) ?Component 3 wk ago ?Urine Urology Culture ID  >100,000 CFU/ML Klebsiella oxytoca Abnormal   ?Resulting Agency [facility] ?Susceptibility ? ?Organism Antibiotic Method Susceptibility ?Klebsiella oxytoca Organism ID MICRO MIC SUSCEPTIBILITY  ?Klebsiella oxytoca Amoxicillin + K Clavulanate MICRO MIC SUSCEPTIBILITY <=8/4: Susceptible ?Klebsiella oxytoca Ampicillin MICRO MIC SUSCEPTIBILITY >16: Resistant ?Klebsiella oxytoca Ampicillin/Sulbactam MICRO MIC SUSCEPTIBILITY 8/4: Susceptible ?Klebsiella oxytoca Aztreonam MICRO MIC SUSCEPTIBILITY <=4: Susceptible ?Klebsiella oxytoca Cefazolin MICRO MIC SUSCEPTIBILITY 4: Intermediate ?Klebsiella oxytoca Cefotaxime MICRO MIC SUSCEPTIBILITY <=2: Susceptible ?Klebsiella oxytoca Ceftriaxone MICRO MIC SUSCEPTIBILITY <=1: Susceptible ?Klebsiella oxytoca Cefuroxime MICRO MIC SUSCEPTIBILITY <=4: Susceptible ?Klebsiella oxytoca Ciprofloxacin MICRO MIC SUSCEPTIBILITY <=0.25: Susceptible ?Klebsiella oxytoca Ertapenem MICRO MIC SUSCEPTIBILITY <=0.5: Susceptible ?Klebsiella  oxytoca Gentamicin MICRO MIC SUSCEPTIBILITY <=2: Susceptible ?Klebsiella oxytoca Meropenem MICRO MIC SUSCEPTIBILITY <=1: Susceptible ?Klebsiella oxytoca Nitrofurantoin MICRO MIC SUSCEPTIBILITY <=32: Susceptible ?Klebsiella oxytoca Piperacillin/Tazobactam MICRO MIC SUSCEPTIBILITY <=8: Susceptible ?Klebsiella oxytoca Tetracycline MICRO MIC SUSCEPTIBILITY <=4: Susceptible ?Klebsiella oxytoca Trimethoprim/Sulfamethoxazole MICRO MIC SUSCEPTIBILITY <=0.5/9.5: Susceptible ?Comment: Antimicrobial susceptibility testing performed by [facility] ?Specimen Collected: 03/02/21 17:27 Last Resulted: 03/04/21 08:12 ?Received From: [facility]  Result Received: 03/08/21 10:18 ? ?

## 2021-03-24 ENCOUNTER — Other Ambulatory Visit: Payer: Self-pay

## 2021-03-24 ENCOUNTER — Inpatient Hospital Stay: Payer: Medicare Other

## 2021-03-24 ENCOUNTER — Encounter: Payer: Self-pay | Admitting: Oncology

## 2021-03-24 DIAGNOSIS — Z170 Estrogen receptor positive status [ER+]: Secondary | ICD-10-CM

## 2021-03-24 LAB — BASIC METABOLIC PANEL
BUN: 8 (ref 4–21)
CO2: 27 — AB (ref 13–22)
Chloride: 106 (ref 99–108)
Creatinine: 0.8 (ref 0.5–1.1)
Glucose: 124
Potassium: 4.3 mEq/L (ref 3.5–5.1)
Sodium: 138 (ref 137–147)

## 2021-03-24 LAB — CBC AND DIFFERENTIAL
HCT: 38 (ref 36–46)
Hemoglobin: 12.5 (ref 12.0–16.0)
Neutrophils Absolute: 16.21
Platelets: 157 10*3/uL (ref 150–400)
WBC: 21.9

## 2021-03-24 LAB — HEPATIC FUNCTION PANEL
ALT: 26 U/L (ref 7–35)
AST: 35 (ref 13–35)
Alkaline Phosphatase: 69 (ref 25–125)
Bilirubin, Total: 0.6

## 2021-03-24 LAB — COMPREHENSIVE METABOLIC PANEL
Albumin: 4.2 (ref 3.5–5.0)
Calcium: 9.2 (ref 8.7–10.7)

## 2021-03-24 LAB — CBC: RBC: 4.02 (ref 3.87–5.11)

## 2021-03-29 ENCOUNTER — Telehealth: Payer: Self-pay

## 2021-03-29 NOTE — Telephone Encounter (Signed)
-----   Message from [name], MD sent at 03/29/2021  7:39 AM EDT ----- ?Regarding: urine ?I have a urine cult with UTI from 3/23.  I don't know who ordered.  Pls call her & find out if placed on ab's.  She's allergic to sulfa and nitrofurantoin so I can order Cipro if hasn't been taken care of ? ?

## 2021-03-30 ENCOUNTER — Encounter: Payer: Self-pay | Admitting: Hematology and Oncology

## 2021-03-30 ENCOUNTER — Inpatient Hospital Stay: Payer: Medicare Other

## 2021-03-30 ENCOUNTER — Telehealth: Payer: Self-pay

## 2021-03-30 ENCOUNTER — Inpatient Hospital Stay (INDEPENDENT_AMBULATORY_CARE_PROVIDER_SITE_OTHER): Payer: Medicare Other | Admitting: Hematology and Oncology

## 2021-03-30 DIAGNOSIS — Z170 Estrogen receptor positive status [ER+]: Secondary | ICD-10-CM | POA: Diagnosis not present

## 2021-03-30 DIAGNOSIS — M792 Neuralgia and neuritis, unspecified: Secondary | ICD-10-CM

## 2021-03-30 DIAGNOSIS — C50112 Malignant neoplasm of central portion of left female breast: Secondary | ICD-10-CM | POA: Diagnosis not present

## 2021-03-30 LAB — CBC AND DIFFERENTIAL
HCT: 40 (ref 36–46)
Hemoglobin: 13.8 (ref 12.0–16.0)
Neutrophils Absolute: 4.92
Platelets: 353 10*3/uL (ref 150–400)
WBC: 8.2

## 2021-03-30 LAB — COMPREHENSIVE METABOLIC PANEL
Albumin: 4.4 (ref 3.5–5.0)
Calcium: 9.6 (ref 8.7–10.7)

## 2021-03-30 LAB — BASIC METABOLIC PANEL
BUN: 14 (ref 4–21)
CO2: 27 — AB (ref 13–22)
Chloride: 102 (ref 99–108)
Creatinine: 0.7 (ref 0.5–1.1)
Glucose: 149
Potassium: 4.3 mEq/L (ref 3.5–5.1)
Sodium: 136 — AB (ref 137–147)

## 2021-03-30 LAB — HEPATIC FUNCTION PANEL
ALT: 36 U/L — AB (ref 7–35)
AST: 38 — AB (ref 13–35)
Alkaline Phosphatase: 54 (ref 25–125)
Bilirubin, Total: 0.6

## 2021-03-30 LAB — CBC: RBC: 4.27 (ref 3.87–5.11)

## 2021-03-30 NOTE — Progress Notes (Addendum)
?Patient Care Team: ?Enid Skeens., MD as PCP - General (Family Medicine) ?Revankar, Reita Cliche, MD as PCP - Cardiology (Cardiology) ?Ulyses Southward., MD (Urology) ?Jackquline Denmark, MD as Consulting Physician (Gastroenterology) ?Marylu Lund., MD as Referring Physician (Surgery) ?[name], MD as Consulting Physician (Oncology) ? ?Clinic Day:  03/30/21 ? ?Referring physician: Enid Skeens., MD ? ?ASSESSMENT & PLAN:  ? 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 She has undergone left mastectomy but one node was positive for malignancy. I definitely recommend hormonal therapy. EndoPredict has revealed her to be extremely high risk with an EP clin score of 5.4, and so I recommend chemotherapy with AC every 3 weeks followed by weekly Taxol. Her risk of recurrence in the next 10 years is 55% and she has an estimated chemotherapy benefit of 27%. She is tolerating AC chemotherapy without difficulty. She will proceed with cycle 3 this week and return to clinic in 3 weeks.  ? 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 She is at the max dose of gabapentin at this time. She states the pain is tolerable at this point. ?  ? ?The patient understands the plans discussed today and is in agreement with them.  She knows to contact our office if she develops concerns prior to her next appointment. ? ? ? ? ?[name], MD  ?[facility] ?[place] ?[address] [place] [address] ?Dept: [phone] ?Dept Fax: [phone]  ? ?No orders of the defined types were placed in this encounter. ?  ? ? ?CHIEF COMPLAINT:  ?CC: A 66 year old female with history of breast cancer here for 3 week evaluation ? ?Current Treatment:   AC ? ?INTERVAL HISTORY:  ?Brandy Maxwell is here today for repeat clinical assessment. She denies fevers or chills. She denies pain. Her appetite is good. Her weight has decreased 5 pounds over last 3 weeks . ? ?I have reviewed the past medical history, past surgical history, social history and family history with the patient and they are unchanged from previous note. ? ?ALLERGIES:  is allergic to nitrofurantoin and sulfa antibiotics. ? ?MEDICATIONS:  ?Current Outpatient Medications  ?Medication Sig Dispense Refill  ? dexamethasone (DECADRON) 4 MG tablet Take 2 tablets (8 mg total) by mouth daily. Take daily for 3 days after chemo. Take with food. 30 tablet 1  ? dicyclomine (BENTYL) 10 MG capsule Take 10 mg by mouth 3 (three) times daily as needed for pain. With IBS    ? DULoxetine (CYMBALTA) 60 MG capsule Take 60 mg by mouth daily.    ? EPINEPHrine 0.3 mg/0.3 mL IJ SOAJ injection Inject 0.3 mg into the muscle as needed for anaphylaxis.    ? ezetimibe (ZETIA) 10 MG tablet Take 10 mg by mouth daily.    ? fenofibrate 160 MG tablet Take 160 mg by mouth daily.    ? fluconazole (DIFLUCAN) 150 MG tablet Take 1 tablet (150 mg total) by mouth daily. 2 tablet 2  ? gabapentin (NEURONTIN) 600 MG tablet Take 1,200 mg by mouth 3 (three) times daily.    ? levofloxacin (LEVAQUIN) 750 MG tablet Take 1 tablet (750 mg total) by mouth daily. 7 tablet 0  ? lidocaine (LIDODERM) 5 % Place 1 patch onto the skin as needed for  pain.    ? lidocaine (XYLOCAINE) 2 % solution SMARTSIG:By Mouth    ? LINZESS 72 MCG capsule Take 1 capsule (72 mcg total) by mouth daily. 90 capsule 4  ? magic mouthwash (lidocaine, diphenhydrAMINE, alum & mag hydroxide) suspension Take 5 mLs by mouth every 3 (three) hours as needed. Swish and spit for mouth or throat pain    ? metoprolol succinate (TOPROL-XL) 25 MG 24 hr tablet Take 1 tablet by mouth daily.    ? MYRBETRIQ 50 MG TB24 tablet Take 50 mg by mouth daily.    ? ondansetron (ZOFRAN) 8 MG tablet Take 1 tablet (8 mg  total) by mouth 2 (two) times daily as needed. Start on the third day after chemotherapy. 30 tablet 1  ? pramipexole (MIRAPEX) 1 MG tablet Take 0.5 mg by mouth daily.    ? prochlorperazine (COMPAZINE) 10 MG tablet Take 1 tablet (10 mg total) by mouth every 6 (six) hours as needed (Nausea or vomiting). 30 tablet 1  ? traMADol (ULTRAM) 50 MG tablet Take 50 mg by mouth 2 (two) times daily as needed for pain.    ? Vitamin D, Ergocalciferol, (DRISDOL) 1.25 MG (50000 UT) CAPS capsule TAKE 1 CAPSULE BY MOUTH ONCE A WEEK FOR 30 DAYS    ? ?No current facility-administered medications for this visit.  ? ? ?HISTORY OF PRESENT ILLNESS:  ? ?Oncology History  ?Malignant neoplasm of central portion of left breast in female, estrogen receptor positive ([place])  ?09/16/2020 Mammogram  ? DIGITAL SCREENING BILATERAL MAMMOGRAM: In the left breast, a possible mass warrants further evaluation. In the right breast, no findings suspicious for malignancy.  ?  ?10/08/2020 Mammogram  ? DIAGNOSTIC UNILATERAL LEFT MAMMOGRAM AND LEFT BREAST ULTRASOUND: ?There is architectural distortion at the left breast 12 o'clock.  ?Targeted ultrasound is performed, showing no focal abnormal discrete cystic or solid lesion at left breast 12 o'clock the correlate to the mammographic finding. Ultrasound of the left axilla is negative. ?  ?10/20/2020 Pathology Results  ? Breast, left, needle core biopsy, 12 o'clock: ?            Invasive mammary carcinoma ?            Mammary carcinoma in situ ?HER2: Negative (0) ?ER: Positive 30% ?PR: Negative 0% ?Ki67: 1% ?  ?10/26/2020 Initial Diagnosis  ? Malignant neoplasm of central portion of left breast in female, estrogen receptor positive ([place]) ? ?  ?12/17/2020 Cancer Staging  ? Staging form: Breast, AJCC 8th Edition ?- Clinical stage from 12/17/2020: Stage IIA (cT1b, cN1(sn), cM0, G2, ER+, PR-, HER2-) - Signed by [name], MD on 02/13/2021 ?Histopathologic type: Infiltrating duct carcinoma, NOS ?Stage prefix:  Initial diagnosis ?Method of lymph node assessment: Sentinel lymph node biopsy ?Nuclear grade: G2 ?Multigene prognostic tests performed: EndoPredict ?Histologic grading system: 3 grade system ?Laterality: Left ?Tumor size (mm): 7 ?Lymph-vascular invasion (LVI): LVI not present (absent)/not identified ?Diagnostic confirmation: Positive histology ?Specimen type: Excision ?Staged by: Managing physician ?Menopausal status: Postmenopausal ?Ki-67 (%): 1 ?EndoPredict EPclin risk score: 5.4 ?EndoPredict EPclin risk level: High risk ?EndoPredict 10-year percentage risk of distant recurrence (%): 55 ?EndoPredict 10-year risk of distant recurrence: High risk ?Stage used in treatment planning: Yes ?National guidelines used in treatment planning: Yes ?Type of national guideline used in treatment planning: NCCN ?Staging comments: Endopredict with 27% benefit from chemotherapy ? ?  ?12/17/2020 Pathology Results  ? Breast, simple mastectomy, left: ?        Invasive ductal carcinoma, grade 2,  7 mm, and ductal carcinoma in situ ?        Ribbon clip and biopsy reaction present ?        All margins negative for invasive carcinoma and DCIS ?        Fibrocystic changes ?Lymph node, sentinel, biopsy, left axillary: ?        Tumor present in regional lymph node (1/1) ?  ?02/18/2021 -  Chemotherapy  ? Patient is on Treatment Plan : Breast AC q21 Days / PACLitaxel q7d  ? ?  ?  ? Genetic Testing  ? Negative genetic testing. No pathogenic variants identified on the [facility] Multi-Cancer+RNA panel. The report date is 03/08/2021. ? ?The Multi-Cancer Panel + RNA offered by [facility] includes sequencing and/or deletion duplication testing of the following 84 genes: AIP, ALK, APC, ATM, AXIN2,BAP1,  BARD1, BLM, BMPR1A, BRCA1, BRCA2, BRIP1, CASR, CDC73, CDH1, CDK4, CDKN1B, CDKN1C, CDKN2A (p14ARF), CDKN2A (p16INK4a), CEBPA, CHEK2, CTNNA1, DICER1, DIS3L2, EGFR (c.2369C>T, p.Thr790Met variant only), EPCAM (Deletion/duplication testing only), FH, FLCN, GATA2,  GPC3, GREM1 (Promoter region deletion/duplication testing only), HOXB13 (c.251G>A, p.Gly84Glu), HRAS, KIT, MAX, MEN1, MET, MITF (c.952G>A, p.Glu318Lys variant only), MLH1, MSH2, MSH3, MSH6, MUTYH, NBN, NF1, NF2, NTHL

## 2021-03-30 NOTE — Assessment & Plan Note (Signed)
She continues to have ongoing neuropathy. She is at the max dose of gabapentin at this time. She states the pain is tolerable at this point. ?

## 2021-03-30 NOTE — Telephone Encounter (Signed)
-----   Message from [name], MD sent at 03/29/2021  7:39 AM EDT ----- ?Regarding: urine ?I have a urine cult with UTI from 3/23.  I don't know who ordered.  Pls call her & find out if placed on ab's.  She's allergic to sulfa and nitrofurantoin so I can order Cipro if hasn't been taken care of ? ?

## 2021-03-30 NOTE — Assessment & Plan Note (Signed)
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 She has undergone left mastectomy but one node was positive for malignancy. I definitely recommend hormonal therapy. EndoPredict has revealed her to be extremely high risk with an EP clin score of 5.4, and so I recommend chemotherapy with AC every 3 weeks followed by weekly Taxol. Her risk of recurrence in the next 10 years is 55% and she has an estimated chemotherapy benefit of 27%. She is tolerating AC chemotherapy without difficulty. She will proceed with cycle 3 this week and return to clinic in 3 weeks.  ?

## 2021-03-30 NOTE — Telephone Encounter (Signed)
Patient in office to [facility], FNP-BC today. Patient just finished a 5 day course of Antibiotics. ?

## 2021-03-31 ENCOUNTER — Encounter: Payer: Self-pay | Admitting: Oncology

## 2021-03-31 NOTE — Telephone Encounter (Signed)
-----   Message from [name], MD sent at 03/29/2021  7:39 AM EDT ----- ?Regarding: urine ?I have a urine cult with UTI from 3/23.  I don't know who ordered.  Pls call her & find out if placed on ab's.  She's allergic to sulfa and nitrofurantoin so I can order Cipro if hasn't been taken care of ? ?

## 2021-03-31 NOTE — Telephone Encounter (Signed)
Patient came to see Dayton Scrape FNP-BC on 03/30/21. Patient just finished up antibiotic.  ?

## 2021-04-01 ENCOUNTER — Inpatient Hospital Stay: Payer: Medicare Other

## 2021-04-01 VITALS — BP 138/78 | HR 98 | Temp 98.2°F | Resp 18 | Wt 196.0 lb

## 2021-04-01 DIAGNOSIS — Z5111 Encounter for antineoplastic chemotherapy: Secondary | ICD-10-CM | POA: Diagnosis not present

## 2021-04-01 DIAGNOSIS — C50112 Malignant neoplasm of central portion of left female breast: Secondary | ICD-10-CM

## 2021-04-01 MED ORDER — SODIUM CHLORIDE 0.9 % IV SOLN
150.0000 mg | Freq: Once | INTRAVENOUS | Status: AC
  Administered 2021-04-01: 150 mg via INTRAVENOUS
  Filled 2021-04-01: qty 150

## 2021-04-01 MED ORDER — SODIUM CHLORIDE 0.9 % IV SOLN
10.0000 mg | Freq: Once | INTRAVENOUS | Status: AC
  Administered 2021-04-01: 10 mg via INTRAVENOUS
  Filled 2021-04-01: qty 10

## 2021-04-01 MED ORDER — SODIUM CHLORIDE 0.9 % IV SOLN: Freq: Once | INTRAVENOUS | Status: AC

## 2021-04-01 MED ORDER — PALONOSETRON HCL INJECTION 0.25 MG/5ML
0.2500 mg | Freq: Once | INTRAVENOUS | Status: AC
  Administered 2021-04-01: 0.25 mg via INTRAVENOUS

## 2021-04-01 MED ORDER — SODIUM CHLORIDE 0.9 % IV SOLN
600.0000 mg/m2 | Freq: Once | INTRAVENOUS | Status: AC
  Administered 2021-04-01: 1220 mg via INTRAVENOUS
  Filled 2021-04-01: qty 61

## 2021-04-01 MED ORDER — DOXORUBICIN HCL CHEMO IV INJECTION 2 MG/ML
60.0000 mg/m2 | Freq: Once | INTRAVENOUS | Status: AC
  Administered 2021-04-01: 122 mg via INTRAVENOUS
  Filled 2021-04-01: qty 61

## 2021-04-01 NOTE — Patient Instructions (Signed)
Cyclophosphamide Injection ?What is this medication? ?CYCLOPHOSPHAMIDE (sye kloe FOSS fa mide) is a chemotherapy drug. It slows the growth of cancer cells. This medicine is used to treat many types of cancer like lymphoma, myeloma, leukemia, breast cancer, and ovarian cancer, to name a few. ?This medicine may be used for other purposes; ask your health care provider or pharmacist if you have questions. ?COMMON BRAND NAME(S): Cytoxan, Neosar ?What should I tell my care team before I take this medication? ?They need to know if you have any of these conditions: ?heart disease ?history of irregular heartbeat ?infection ?kidney disease ?liver disease ?low blood counts, like white cells, platelets, or red blood cells ?on hemodialysis ?recent or ongoing radiation therapy ?scarring or thickening of the lungs ?trouble passing urine ?an unusual or allergic reaction to cyclophosphamide, other medicines, foods, dyes, or preservatives ?pregnant or trying to get pregnant ?breast-feeding ?How should I use this medication? ?This drug is usually given as an injection into a vein or muscle or by infusion into a vein. It is administered in a hospital or clinic by a specially trained health care professional. ?Talk to your pediatrician regarding the use of this medicine in children. Special care may be needed. ?Overdosage: If you think you have taken too much of this medicine contact a poison control center or emergency room at once. ?NOTE: This medicine is only for you. Do not share this medicine with others. ?What if I miss a dose? ?It is important not to miss your dose. Call your doctor or health care professional if you are unable to keep an appointment. ?What may interact with this medication? ?amphotericin B ?azathioprine ?certain antivirals for HIV or hepatitis ?certain medicines for blood pressure, heart disease, irregular heart beat ?certain medicines that treat or prevent blood clots like warfarin ?certain other medicines for  cancer ?cyclosporine ?etanercept ?indomethacin ?medicines that relax muscles for surgery ?medicines to increase blood counts ?metronidazole ?This list may not describe all possible interactions. Give your health care provider a list of all the medicines, herbs, non-prescription drugs, or dietary supplements you use. Also tell them if you smoke, drink alcohol, or use illegal drugs. Some items may interact with your medicine. ?What should I watch for while using this medication? ?Your condition will be monitored carefully while you are receiving this medicine. ?You may need blood work done while you are taking this medicine. ?Drink water or other fluids as directed. Urinate often, even at night. ?Some products may contain alcohol. Ask your health care professional if this medicine contains alcohol. Be sure to tell all health care professionals you are taking this medicine. Certain medicines, like metronidazole and disulfiram, can cause an unpleasant reaction when taken with alcohol. The reaction includes flushing, headache, nausea, vomiting, sweating, and increased thirst. The reaction can last from 30 minutes to several hours. ?Do not become pregnant while taking this medicine or for 1 year after stopping it. Women should inform their health care professional if they wish to become pregnant or think they might be pregnant. Men should not father a child while taking this medicine and for 4 months after stopping it. There is potential for serious side effects to an unborn child. Talk to your health care professional for more information. ?Do not breast-feed an infant while taking this medicine or for 1 week after stopping it. ?This medicine has caused ovarian failure in some women. This medicine may make it more difficult to get pregnant. Talk to your health care professional if you are concerned  about your fertility. ?This medicine has caused decreased sperm counts in some men. This may make it more difficult to  father a child. Talk to your health care professional if you are concerned about your fertility. ?Call your health care professional for advice if you get a fever, chills, or sore throat, or other symptoms of a cold or flu. Do not treat yourself. This medicine decreases your body's ability to fight infections. Try to avoid being around people who are sick. ?Avoid taking medicines that contain aspirin, acetaminophen, ibuprofen, naproxen, or ketoprofen unless instructed by your health care professional. These medicines may hide a fever. ?Talk to your health care professional about your risk of cancer. You may be more at risk for certain types of cancer if you take this medicine. ?If you are going to need surgery or other procedure, tell your health care professional that you are using this medicine. ?Be careful brushing or flossing your teeth or using a toothpick because you may get an infection or bleed more easily. If you have any dental work done, tell your dentist you are receiving this medicine. ?What side effects may I notice from receiving this medication? ?Side effects that you should report to your doctor or health care professional as soon as possible: ?allergic reactions like skin rash, itching or hives, swelling of the face, lips, or tongue ?breathing problems ?nausea, vomiting ?signs and symptoms of bleeding such as bloody or black, tarry stools; red or dark brown urine; spitting up blood or brown material that looks like coffee grounds; red spots on the skin; unusual bruising or bleeding from the eyes, gums, or nose ?signs and symptoms of heart failure like fast, irregular heartbeat, sudden weight gain; swelling of the ankles, feet, hands ?signs and symptoms of infection like fever; chills; cough; sore throat; pain or trouble passing urine ?signs and symptoms of kidney injury like trouble passing urine or change in the amount of urine ?signs and symptoms of liver injury like dark yellow or brown urine;  general ill feeling or flu-like symptoms; light-colored stools; loss of appetite; nausea; right upper belly pain; unusually weak or tired; yellowing of the eyes or skin ?Side effects that usually do not require medical attention (report to your doctor or health care professional if they continue or are bothersome): ?confusion ?decreased hearing ?diarrhea ?facial flushing ?hair loss ?headache ?loss of appetite ?missed menstrual periods ?signs and symptoms of low red blood cells or anemia such as unusually weak or tired; feeling faint or lightheaded; falls ?skin discoloration ?This list may not describe all possible side effects. Call your doctor for medical advice about side effects. You may report side effects to FDA at 1-800-FDA-1088. ?Where should I keep my medication? ?This drug is given in a hospital or clinic and will not be stored at home. ?NOTE: This sheet is a summary. It may not cover all possible information. If you have questions about this medicine, talk to your doctor, pharmacist, or health care provider. ?? 2022 Elsevier/Gold Standard (2020-09-07 00:00:00) ?Doxorubicin injection ?What is this medication? ?DOXORUBICIN (dox oh ROO bi sin) is a chemotherapy drug. It is used to treat many kinds of cancer like leukemia, lymphoma, neuroblastoma, sarcoma, and Wilms' tumor. It is also used to treat bladder cancer, breast cancer, lung cancer, ovarian cancer, stomach cancer, and thyroid cancer. ?This medicine may be used for other purposes; ask your health care provider or pharmacist if you have questions. ?COMMON BRAND NAME(S): Adriamycin, Adriamycin PFS, Adriamycin RDF, Rubex ?What should I tell  my care team before I take this medication? ?They need to know if you have any of these conditions: ?heart disease ?history of low blood counts caused by a medicine ?liver disease ?recent or ongoing radiation therapy ?an unusual or allergic reaction to doxorubicin, other chemotherapy agents, other medicines, foods,  dyes, or preservatives ?pregnant or trying to get pregnant ?breast-feeding ?How should I use this medication? ?This drug is given as an infusion into a vein. It is administered in a hospital or clinic by a sp

## 2021-04-04 ENCOUNTER — Inpatient Hospital Stay: Payer: Medicare Other | Attending: Oncology

## 2021-04-04 VITALS — BP 105/87 | HR 96 | Temp 98.9°F | Resp 18 | Ht 64.0 in | Wt 197.0 lb

## 2021-04-04 DIAGNOSIS — C50112 Malignant neoplasm of central portion of left female breast: Secondary | ICD-10-CM | POA: Diagnosis present

## 2021-04-04 DIAGNOSIS — Z5189 Encounter for other specified aftercare: Secondary | ICD-10-CM | POA: Insufficient documentation

## 2021-04-04 DIAGNOSIS — Z170 Estrogen receptor positive status [ER+]: Secondary | ICD-10-CM | POA: Insufficient documentation

## 2021-04-04 MED ORDER — PEGFILGRASTIM-CBQV 6 MG/0.6ML ~~LOC~~ SOSY
6.0000 mg | PREFILLED_SYRINGE | Freq: Once | SUBCUTANEOUS | Status: AC
  Administered 2021-04-04: 6 mg via SUBCUTANEOUS
  Filled 2021-04-04: qty 0.6

## 2021-04-04 NOTE — Patient Instructions (Signed)

## 2021-04-08 ENCOUNTER — Telehealth: Payer: Self-pay

## 2021-04-08 NOTE — Telephone Encounter (Signed)
Returned call to patient, had to leave VM. ?

## 2021-04-08 NOTE — Telephone Encounter (Signed)
-----   Message from Melodye Ped, NP sent at 04/08/2021 12:40 PM EDT ----- ?Regarding: RE: sweating ?Yes, it can be. As long as she is not running fevers, she should be fine.  ?----- Message ----- ?From: Georgette Shell, RN ?Sent: 04/08/2021  12:38 PM EDT ?To: Melodye Ped, NP ?Subject: sweating                                      ? ?C/O severe sweating > on one side than the other, especially at night..  Wants to know if this is normal due to her chemo therapy.   ? ? ?

## 2021-04-20 ENCOUNTER — Inpatient Hospital Stay: Payer: Medicare Other

## 2021-04-20 ENCOUNTER — Other Ambulatory Visit: Payer: Self-pay

## 2021-04-20 ENCOUNTER — Inpatient Hospital Stay (INDEPENDENT_AMBULATORY_CARE_PROVIDER_SITE_OTHER): Payer: Medicare Other | Admitting: Hematology and Oncology

## 2021-04-20 DIAGNOSIS — M792 Neuralgia and neuritis, unspecified: Secondary | ICD-10-CM

## 2021-04-20 DIAGNOSIS — Z170 Estrogen receptor positive status [ER+]: Secondary | ICD-10-CM

## 2021-04-20 DIAGNOSIS — C50112 Malignant neoplasm of central portion of left female breast: Secondary | ICD-10-CM

## 2021-04-20 LAB — BASIC METABOLIC PANEL
BUN: 12 (ref 4–21)
CO2: 25 — AB (ref 13–22)
Chloride: 100 (ref 99–108)
Creatinine: 0.7 (ref 0.5–1.1)
Glucose: 232
Potassium: 4.5 mEq/L (ref 3.5–5.1)
Sodium: 133 — AB (ref 137–147)

## 2021-04-20 LAB — COMPREHENSIVE METABOLIC PANEL
Albumin: 4.2 (ref 3.5–5.0)
Calcium: 9 (ref 8.7–10.7)

## 2021-04-20 LAB — HEPATIC FUNCTION PANEL
ALT: 29 U/L (ref 7–35)
AST: 37 — AB (ref 13–35)
Alkaline Phosphatase: 55 (ref 25–125)
Bilirubin, Total: 0.7

## 2021-04-20 LAB — CBC AND DIFFERENTIAL
HCT: 38 (ref 36–46)
Hemoglobin: 12.6 (ref 12.0–16.0)
Neutrophils Absolute: 4.42
Platelets: 380 10*3/uL (ref 150–400)

## 2021-04-20 LAB — CBC: RBC: 3.93 (ref 3.87–5.11)

## 2021-04-20 MED ORDER — LEVOFLOXACIN 750 MG PO TABS: 750.0000 mg | ORAL_TABLET | Freq: Every day | ORAL | 0 refills | Status: DC

## 2021-04-21 ENCOUNTER — Encounter: Payer: Self-pay | Admitting: Oncology

## 2021-04-21 ENCOUNTER — Encounter: Payer: Self-pay | Admitting: Hematology and Oncology

## 2021-04-21 LAB — CBC AND DIFFERENTIAL: WBC: 6.9

## 2021-04-21 NOTE — Assessment & Plan Note (Signed)
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 She has undergone left mastectomy but one node was positive for malignancy. We recommend hormonal therapy. EndoPredict has revealed her to be extremely high risk with an EP clin score of 5.4, and so we recommend chemotherapy with AC every 3 weeks followed by weekly Taxol. Her risk of recurrence in the next 10 years is 55% and she has an estimated chemotherapy benefit of 27%. She has been tolerating treatment well until this week when she presents with fever 100.2 and general malaise. She cannot put her finger on exactly what is bothering her, she just feels poorly overall. We will hold cycle 4 and reassess her in one week. I will also send in Levaquin to treat any underlying infection.  ?

## 2021-04-21 NOTE — Progress Notes (Signed)
?Patient Care Team: ?Enid Skeens., MD as PCP - General (Family Medicine) ?Revankar, Reita Cliche, MD as PCP - Cardiology (Cardiology) ?Ulyses Southward., MD (Urology) ?Jackquline Denmark, MD as Consulting Physician (Gastroenterology) ?Marylu Lund., MD as Referring Physician (Surgery) ?[name], MD as Consulting Physician (Oncology) ? ?Clinic Day:  04/21/2021 ? ?Referring physician: Enid Skeens., MD ? ?ASSESSMENT & PLAN:  ? 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 She has undergone left mastectomy but one node was positive for malignancy. We recommend hormonal therapy. EndoPredict has revealed her to be extremely high risk with an EP clin score of 5.4, and so we recommend chemotherapy with AC every 3 weeks followed by weekly Taxol. Her risk of recurrence in the next 10 years is 55% and she has an estimated chemotherapy benefit of 27%. She has been tolerating treatment well until this week when she presents with fever 100.2 and general malaise. She cannot put her finger on exactly what is bothering her, she just feels poorly overall. We will hold cycle 4 and reassess her in one week. I will also send in Levaquin to treat any underlying infection.  ? 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 She is at the max dose of gabapentin at this time. She states the pain is tolerable at this point. ?  ? ?The patient understands the plans discussed today and is in agreement with them.  She knows to contact our office if she develops concerns prior to her next appointment. ? ? ? ?Melodye Ped, NP  ?[facility] ?[place] ?[place] [address] ?Dept: [phone] ?Dept Fax: [phone]  ? ?Orders Placed This Encounter   ?Procedures  ? CBC and differential  ?  This external order was created through the Results Console.  ? CBC and differential  ?  This external order was created through the Results Console.  ? CBC  ?  This external order was created through the Results Console.  ? Basic metabolic panel  ?  This external order was created through the Results Console.  ? Comprehensive metabolic panel  ?  This external order was created through the Results Console.  ? Hepatic function panel  ?  This external order was created through the Results Console.  ?  ? ? ?CHIEF COMPLAINT:  ?CC: A 66 year old female with history of breast cancer here for 3 week evaluation ? ?Current Treatment:  AC every 21 days. ? ?INTERVAL HISTORY:  ?Brandy Maxwell is here today for repeat clinical assessment. She denies fevers or chills. She denies pain. Her appetite is good. Her weight has been stable. ? ?I have reviewed the past medical history, past surgical history, social history and family history with the patient and they are unchanged from previous note. ? ?ALLERGIES:  is allergic to nitrofurantoin and sulfa antibiotics. ? ?MEDICATIONS:  ?Current Outpatient Medications  ?Medication Sig Dispense Refill  ? levofloxacin (LEVAQUIN) 750 MG tablet Take 1 tablet (750 mg total) by mouth daily. 7 tablet 0  ? dexamethasone (DECADRON) 4 MG tablet Take 2 tablets (8 mg total) by mouth daily. Take daily for 3 days after chemo. Take with food. 30 tablet 1  ? dicyclomine (BENTYL) 10 MG capsule Take 10 mg by mouth 3 (three) times daily  as needed for pain. With IBS    ? DULoxetine (CYMBALTA) 60 MG capsule Take 60 mg by mouth daily.    ? EPINEPHrine 0.3 mg/0.3 mL IJ SOAJ injection Inject 0.3 mg into the muscle as needed for anaphylaxis.    ? ezetimibe (ZETIA) 10 MG tablet Take 10 mg by mouth daily.    ? fenofibrate 160 MG tablet Take 160 mg by mouth daily.    ? fluconazole (DIFLUCAN) 150 MG tablet Take 1 tablet (150 mg total) by mouth daily. 2 tablet 2  ? gabapentin (NEURONTIN)  600 MG tablet Take 1,200 mg by mouth 3 (three) times daily.    ? lidocaine (LIDODERM) 5 % Place 1 patch onto the skin as needed for pain.    ? lidocaine (XYLOCAINE) 2 % solution SMARTSIG:By Mouth    ? LINZESS 72 MCG capsule Take 1 capsule (72 mcg total) by mouth daily. 90 capsule 4  ? magic mouthwash (lidocaine, diphenhydrAMINE, alum & mag hydroxide) suspension Take 5 mLs by mouth every 3 (three) hours as needed. Swish and spit for mouth or throat pain    ? metoprolol succinate (TOPROL-XL) 25 MG 24 hr tablet Take 1 tablet by mouth daily.    ? MYRBETRIQ 50 MG TB24 tablet Take 50 mg by mouth daily.    ? ondansetron (ZOFRAN) 8 MG tablet Take 1 tablet (8 mg total) by mouth 2 (two) times daily as needed. Start on the third day after chemotherapy. 30 tablet 1  ? pramipexole (MIRAPEX) 1 MG tablet Take 0.5 mg by mouth daily.    ? prochlorperazine (COMPAZINE) 10 MG tablet Take 1 tablet (10 mg total) by mouth every 6 (six) hours as needed (Nausea or vomiting). 30 tablet 1  ? traMADol (ULTRAM) 50 MG tablet Take 50 mg by mouth 2 (two) times daily as needed for pain.    ? Vitamin D, Ergocalciferol, (DRISDOL) 1.25 MG (50000 UT) CAPS capsule TAKE 1 CAPSULE BY MOUTH ONCE A WEEK FOR 30 DAYS    ? ?No current facility-administered medications for this visit.  ? ? ?HISTORY OF PRESENT ILLNESS:  ? ?Oncology History  ?Malignant neoplasm of central portion of left breast in female, estrogen receptor positive ([place])  ?09/16/2020 Mammogram  ? DIGITAL SCREENING BILATERAL MAMMOGRAM: In the left breast, a possible mass warrants further evaluation. In the right breast, no findings suspicious for malignancy.  ?  ?10/08/2020 Mammogram  ? DIAGNOSTIC UNILATERAL LEFT MAMMOGRAM AND LEFT BREAST ULTRASOUND: ?There is architectural distortion at the left breast 12 o'clock.  ?Targeted ultrasound is performed, showing no focal abnormal discrete cystic or solid lesion at left breast 12 o'clock the correlate to the mammographic finding. Ultrasound of the left  axilla is negative. ?  ?10/20/2020 Pathology Results  ? Breast, left, needle core biopsy, 12 o'clock: ?            Invasive mammary carcinoma ?            Mammary carcinoma in situ ?HER2: Negative (0) ?ER: Positive 30% ?PR: Negative 0% ?Ki67: 1% ?  ?10/26/2020 Initial Diagnosis  ? Malignant neoplasm of central portion of left breast in female, estrogen receptor positive ([place]) ? ?  ?12/17/2020 Cancer Staging  ? Staging form: Breast, AJCC 8th Edition ?- Clinical stage from 12/17/2020: Stage IIA (cT1b, cN1(sn), cM0, G2, ER+, PR-, HER2-) - Signed by [name], MD on 02/13/2021 ?Histopathologic type: Infiltrating duct carcinoma, NOS ?Stage prefix: Initial diagnosis ?Method of lymph node assessment: Sentinel lymph node biopsy ?Nuclear grade: G2 ?  Multigene prognostic tests performed: EndoPredict ?Histologic grading system: 3 grade system ?Laterality: Left ?Tumor size (mm): 7 ?Lymph-vascular invasion (LVI): LVI not present (absent)/not identified ?Diagnostic confirmation: Positive histology ?Specimen type: Excision ?Staged by: Managing physician ?Menopausal status: Postmenopausal ?Ki-67 (%): 1 ?EndoPredict EPclin risk score: 5.4 ?EndoPredict EPclin risk level: High risk ?EndoPredict 10-year percentage risk of distant recurrence (%): 55 ?EndoPredict 10-year risk of distant recurrence: High risk ?Stage used in treatment planning: Yes ?National guidelines used in treatment planning: Yes ?Type of national guideline used in treatment planning: NCCN ?Staging comments: Endopredict with 27% benefit from chemotherapy ? ?  ?12/17/2020 Pathology Results  ? Breast, simple mastectomy, left: ?        Invasive ductal carcinoma, grade 2, 7 mm, and ductal carcinoma in situ ?        Ribbon clip and biopsy reaction present ?        All margins negative for invasive carcinoma and DCIS ?        Fibrocystic changes ?Lymph node, sentinel, biopsy, left axillary: ?        Tumor present in regional lymph node (1/1) ?  ?02/18/2021 -   Chemotherapy  ? Patient is on Treatment Plan : Breast AC q21 Days / PACLitaxel q7d  ? ?  ?  ? Genetic Testing  ? Negative genetic testing. No pathogenic variants identified on the [employer] Multi-Cancer+RNA p

## 2021-04-21 NOTE — Assessment & Plan Note (Signed)
She continues to have ongoing neuropathy. She is at the max dose of gabapentin at this time. She states the pain is tolerable at this point. ?

## 2021-04-22 ENCOUNTER — Ambulatory Visit: Payer: Medicare Other

## 2021-04-25 ENCOUNTER — Ambulatory Visit: Payer: Medicare Other

## 2021-04-28 ENCOUNTER — Other Ambulatory Visit: Payer: Self-pay

## 2021-04-29 ENCOUNTER — Inpatient Hospital Stay: Payer: Medicare Other

## 2021-04-29 ENCOUNTER — Inpatient Hospital Stay (INDEPENDENT_AMBULATORY_CARE_PROVIDER_SITE_OTHER): Payer: Medicare Other | Admitting: Hematology and Oncology

## 2021-04-29 ENCOUNTER — Other Ambulatory Visit: Payer: Self-pay

## 2021-04-29 ENCOUNTER — Encounter: Payer: Self-pay | Admitting: Hematology and Oncology

## 2021-04-29 DIAGNOSIS — C50112 Malignant neoplasm of central portion of left female breast: Secondary | ICD-10-CM

## 2021-04-29 DIAGNOSIS — M792 Neuralgia and neuritis, unspecified: Secondary | ICD-10-CM

## 2021-04-29 DIAGNOSIS — Z170 Estrogen receptor positive status [ER+]: Secondary | ICD-10-CM | POA: Diagnosis not present

## 2021-04-29 LAB — BASIC METABOLIC PANEL
BUN: 11 (ref 4–21)
CO2: 24 — AB (ref 13–22)
Chloride: 102 (ref 99–108)
Creatinine: 0.6 (ref 0.5–1.1)
Glucose: 146
Potassium: 4.4 mEq/L (ref 3.5–5.1)
Sodium: 136 — AB (ref 137–147)

## 2021-04-29 LAB — CBC AND DIFFERENTIAL
HCT: 39 (ref 36–46)
Hemoglobin: 13 (ref 12.0–16.0)
Neutrophils Absolute: 2.91
Platelets: 270 10*3/uL (ref 150–400)
WBC: 5.1

## 2021-04-29 LAB — COMPREHENSIVE METABOLIC PANEL
Albumin: 4.3 (ref 3.5–5.0)
Calcium: 9.2 (ref 8.7–10.7)

## 2021-04-29 LAB — HEPATIC FUNCTION PANEL
ALT: 28 U/L (ref 7–35)
AST: 34 (ref 13–35)
Alkaline Phosphatase: 54 (ref 25–125)
Bilirubin, Total: 0.8

## 2021-04-29 LAB — CBC: RBC: 4 (ref 3.87–5.11)

## 2021-04-29 NOTE — Assessment & Plan Note (Signed)
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 She has undergone left mastectomy but one node was positive for malignancy. We recommend hormonal therapy. EndoPredict has revealed her to be extremely high risk with an EP clin score of 5.4, and so we recommend chemotherapy with AC every 3 weeks followed by weekly Taxol. Her risk of recurrence in the next 10 years is 55% and she has an estimated chemotherapy benefit of 27%. She has completed her Levaquin and reports feeling much better. We will proceed with cycle 4 next week. She has several teeth that are infected and she feels this was the cause of her fever last week. I advised that she have this done during her week off of chemotherapy, preferably after the 10 day nadir. We will see her back in 3 weeks for repeat evaluation. ?

## 2021-04-29 NOTE — Assessment & Plan Note (Signed)
She continues to have ongoing neuropathy. She is at the max dose of gabapentin at this time. She states the pain is tolerable at this point. ?

## 2021-04-29 NOTE — Progress Notes (Signed)
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 She has undergone left mastectomy but one node was positive for malignancy. We recommend hormonal therapy. EndoPredict has revealed her to be extremely high risk with an EP clin score of 5.4, and so we recommend chemotherapy with AC every 3 weeks followed by weekly Taxol. Her risk of recurrence in the next 10 years is 55% and she has an estimated chemotherapy benefit of 27%. She has completed her Levaquin and reports feeling much better. We will proceed with cycle 4 next week. She has several teeth that are infected and she feels this was the cause of her fever last week. I advised that she have this done during her week off of chemotherapy, preferably after the 10 day nadir. We will see her back in 3 weeks for repeat evaluation.  Neuropathic pain She continues to have ongoing neuropathy. She is at the max dose of gabapentin at this time. She states the pain is tolerable at this point.   The patient understands the plans discussed today and is in agreement with them.  She knows to contact our office if she develops concerns prior to her next appointment.    Melodye Ped, NP  [facility] [address] [place] [address] Dept: [phone] Dept Fax:  [phone]   Orders Placed This Encounter  Procedures   CBC and differential    This external order was created through the Results Console.   CBC    This external order was created through the Results Console.   Basic metabolic panel    This external order was created through the Results Console.   Comprehensive metabolic panel    This external order was created through the Results Console.   Hepatic function panel    This external order was created through the Results Console.      CHIEF COMPLAINT:  CC: A 66 year old female with history of breast cancer here for 1 week evaluation  Current Treatment:  AC   INTERVAL HISTORY:  Brandy Maxwell is here today for repeat clinical assessment. She denies fevers or chills. She denies pain. Her appetite is good. Her weight has been stable.  I have reviewed the past medical history, past surgical history, social history and family history with the patient and they are unchanged from previous note.  ALLERGIES:  is allergic to nitrofurantoin and sulfa antibiotics.  MEDICATIONS:  Current Outpatient Medications  Medication Sig Dispense Refill   dexamethasone (DECADRON) 4 MG tablet Take 2 tablets (8 mg total) by mouth daily. Take daily for 3 days after chemo. Take with food. 30 tablet 1   dicyclomine (BENTYL) 10 MG capsule Take 10 mg by mouth 3 (three) times daily as needed for pain. With IBS     DULoxetine (CYMBALTA) 60 MG capsule Take 60 mg by mouth daily.     EPINEPHrine 0.3 mg/0.3 mL  IJ SOAJ injection Inject 0.3 mg into the muscle as needed for anaphylaxis.     ezetimibe (ZETIA) 10 MG tablet Take 10 mg by mouth daily.     fenofibrate 160 MG tablet Take 160 mg by mouth daily.     gabapentin (NEURONTIN) 600 MG tablet Take 1,200 mg by mouth 3 (three) times daily.     lidocaine (LIDODERM) 5 % Place 1 patch onto the skin as needed for pain.     lidocaine (XYLOCAINE) 2 % solution SMARTSIG:By Mouth     LINZESS 72 MCG capsule Take 1 capsule (72 mcg  total) by mouth daily. 90 capsule 4   magic mouthwash (lidocaine, diphenhydrAMINE, alum & mag hydroxide) suspension Take 5 mLs by mouth every 3 (three) hours as needed. Swish and spit for mouth or throat pain     metoprolol succinate (TOPROL-XL) 25 MG 24 hr tablet Take 1 tablet by mouth daily.     mirabegron ER (MYRBETRIQ) 50 MG TB24 tablet Take 1 tablet by mouth daily.     ondansetron (ZOFRAN) 8 MG tablet Take 1 tablet (8 mg total) by mouth 2 (two) times daily as needed. Start on the third day after chemotherapy. 30 tablet 1   pramipexole (MIRAPEX) 1 MG tablet Take by mouth.     prochlorperazine (COMPAZINE) 10 MG tablet Take 1 tablet (10 mg total) by mouth every 6 (six) hours as needed (Nausea or vomiting). 30 tablet 1   traMADol (ULTRAM) 50 MG tablet Take 50 mg by mouth 2 (two) times daily as needed for pain.     Vitamin D, Ergocalciferol, (DRISDOL) 1.25 MG (50000 UT) CAPS capsule TAKE 1 CAPSULE BY MOUTH ONCE A WEEK FOR 30 DAYS     No current facility-administered medications for this visit.    HISTORY OF PRESENT ILLNESS:   Oncology History  Malignant neoplasm of central portion of left breast in female, estrogen receptor positive ([place])  09/16/2020 Mammogram   DIGITAL SCREENING BILATERAL MAMMOGRAM: In the left breast, a possible mass warrants further evaluation. In the right breast, no findings suspicious for malignancy.    10/08/2020 Mammogram   DIAGNOSTIC UNILATERAL LEFT MAMMOGRAM AND LEFT BREAST ULTRASOUND: There is architectural distortion at the left breast 12 o'clock.  Targeted ultrasound is performed, showing no focal abnormal discrete cystic or solid lesion at left breast 12 o'clock the correlate to the mammographic finding. Ultrasound of the left axilla is negative.   10/20/2020 Pathology Results   Breast, left, needle core biopsy, 12 o'clock:             Invasive mammary carcinoma             Mammary carcinoma in situ HER2: Negative (0) ER: Positive 30% PR: Negative 0% Ki67:  1%   10/26/2020 Initial Diagnosis   Malignant neoplasm of central portion of left breast in female, estrogen receptor positive ([place])    12/17/2020 Cancer Staging   Staging form: Breast, AJCC 8th Edition - Clinical stage from 12/17/2020: Stage IIA (cT1b, cN1(sn), cM0, G2, ER+, PR-, HER2-) - Signed by [name], MD on 02/13/2021 Histopathologic type: Infiltrating duct carcinoma, NOS Stage prefix: Initial diagnosis Method of lymph node assessment: Sentinel lymph node biopsy Nuclear grade: G2 Multigene prognostic tests performed: EndoPredict Histologic grading system: 3 grade system Laterality: Left Tumor size (mm): 7 Lymph-vascular invasion (LVI): LVI not present (absent)/not identified Diagnostic confirmation: Positive histology Specimen type: Excision Staged by: Managing physician Menopausal status: Postmenopausal Ki-67 (%): 1 EndoPredict EPclin risk score: 5.4 EndoPredict EPclin  risk level: High risk EndoPredict 10-year percentage risk of distant recurrence (%): 55 EndoPredict 10-year risk of distant recurrence: High risk Stage used in treatment planning: Yes National guidelines used in treatment planning: Yes Type of national guideline used in treatment planning: NCCN Staging comments: Endopredict with 27% benefit from chemotherapy    12/17/2020 Pathology Results   Breast, simple mastectomy, left:         Invasive ductal carcinoma, grade 2, 7 mm, and ductal carcinoma in situ         Ribbon clip and biopsy reaction present         All margins negative for invasive carcinoma and DCIS         Fibrocystic changes Lymph node, sentinel, biopsy, left axillary:         Tumor present in regional lymph node (1/1)   02/18/2021 -  Chemotherapy   Patient is on Treatment Plan : Breast AC q21 Days / PACLitaxel q7d        Genetic Testing   Negative genetic testing. No pathogenic variants identified on the [facility] Multi-Cancer+RNA panel. The report date is 03/08/2021.  The  Multi-Cancer Panel + RNA offered by [facility] includes sequencing and/or deletion duplication testing of the following 84 genes: AIP, ALK, APC, ATM, AXIN2,BAP1,  BARD1, BLM, BMPR1A, BRCA1, BRCA2, BRIP1, CASR, CDC73, CDH1, CDK4, CDKN1B, CDKN1C, CDKN2A (p14ARF), CDKN2A (p16INK4a), CEBPA, CHEK2, CTNNA1, DICER1, DIS3L2, EGFR (c.2369C>T, p.Thr790Met variant only), EPCAM (Deletion/duplication testing only), FH, FLCN, GATA2, GPC3, GREM1 (Promoter region deletion/duplication testing only), HOXB13 (c.251G>A, p.Gly84Glu), HRAS, KIT, MAX, MEN1, MET, MITF (c.952G>A, p.Glu318Lys variant only), MLH1, MSH2, MSH3, MSH6, MUTYH, NBN, NF1, NF2, NTHL1, PALB2, PDGFRA, PHOX2B, PMS2, POLD1, POLE, POT1, PRKAR1A, PTCH1, PTEN, RAD50, RAD51C, RAD51D, RB1, RECQL4, RET, RUNX1, SDHAF2, SDHA (sequence changes only), SDHB, SDHC, SDHD, SMAD4, SMARCA4, SMARCB1, SMARCE1, STK11, SUFU, TERC, TERT, TMEM127, TP53, TSC1, TSC2, VHL, WRN and WT1.       REVIEW OF SYSTEMS:   Constitutional: Denies fevers, chills or abnormal weight loss Eyes: Denies blurriness of vision Ears, nose, mouth, throat, and face: Denies mucositis or sore throat Respiratory: Denies cough, dyspnea or wheezes Cardiovascular: Denies palpitation, chest discomfort or lower extremity swelling Gastrointestinal:  Denies nausea, heartburn or change in bowel habits Skin: Denies abnormal skin rashes Lymphatics: Denies new lymphadenopathy or easy bruising Neurological:Denies numbness, tingling or new weaknesses Behavioral/Psych: Mood is stable, no new changes  All other systems were reviewed with the patient and are negative.   VITALS:  Blood pressure 116/61, pulse 94, temperature 98.4 F (36.9 C), temperature source Oral, resp. rate 20, height $RemoveBe'5\' 4"'ZWeUajTab$  (1.626 m), weight 197 lb 8 oz (89.6 kg), SpO2 98 %.  Wt Readings from Last 3 Encounters:  04/29/21 197 lb 8 oz (89.6 kg)  04/20/21 195 lb 12.8 oz (88.8 kg)  04/04/21 197 lb (89.4 kg)    Body mass index is 33.9  kg/m.  Performance status (ECOG): 1 - Symptomatic but completely ambulatory  PHYSICAL EXAM:   GENERAL:alert, no distress and comfortable SKIN: skin color, texture, turgor are normal, no rashes or significant lesions EYES: normal, Conjunctiva are pink and non-injected, sclera clear OROPHARYNX:no exudate, no erythema and lips, buccal mucosa, and tongue normal  NECK: supple, thyroid normal size, non-tender, without nodularity LYMPH:  no palpable lymphadenopathy in the cervical, axillary or inguinal LUNGS: clear to auscultation and percussion with normal breathing effort HEART: regular rate & rhythm and no murmurs and no lower extremity edema ABDOMEN:abdomen soft, non-tender and normal bowel sounds Musculoskeletal:no cyanosis of digits  and no clubbing  NEURO: alert & oriented x 3 with fluent speech, no focal motor/sensory deficits  LABORATORY DATA:  I have reviewed the data as listed    Component Value Date/Time   NA 136 (A) 04/29/2021 0000   K 4.4 04/29/2021 0000   CL 102 04/29/2021 0000   CO2 24 (A) 04/29/2021 0000   BUN 11 04/29/2021 0000   CREATININE 0.6 04/29/2021 0000   CALCIUM 9.2 04/29/2021 0000   ALBUMIN 4.3 04/29/2021 0000   AST 34 04/29/2021 0000   ALT 28 04/29/2021 0000   ALKPHOS 54 04/29/2021 0000    No results found for: SPEP, UPEP  Lab Results  Component Value Date   WBC 5.1 04/29/2021   NEUTROABS 2.91 04/29/2021   HGB 13.0 04/29/2021   HCT 39 04/29/2021   MCV 94 02/03/2021   PLT 270 04/29/2021      Chemistry      Component Value Date/Time   NA 136 (A) 04/29/2021 0000   K 4.4 04/29/2021 0000   CL 102 04/29/2021 0000   CO2 24 (A) 04/29/2021 0000   BUN 11 04/29/2021 0000   CREATININE 0.6 04/29/2021 0000   GLU 146 04/29/2021 0000      Component Value Date/Time   CALCIUM 9.2 04/29/2021 0000   ALKPHOS 54 04/29/2021 0000   AST 34 04/29/2021 0000   ALT 28 04/29/2021 0000       RADIOGRAPHIC STUDIES: I have personally reviewed the radiological  images as listed and agreed with the findings in the report. No results found.

## 2021-05-02 ENCOUNTER — Encounter: Payer: Self-pay | Admitting: Oncology

## 2021-05-03 ENCOUNTER — Encounter: Payer: Self-pay | Admitting: Oncology

## 2021-05-03 ENCOUNTER — Other Ambulatory Visit: Payer: Self-pay

## 2021-05-03 ENCOUNTER — Inpatient Hospital Stay: Payer: Medicare Other | Attending: Oncology

## 2021-05-03 VITALS — BP 107/67 | HR 104 | Resp 18 | Ht 64.0 in | Wt 193.2 lb

## 2021-05-03 DIAGNOSIS — C50112 Malignant neoplasm of central portion of left female breast: Secondary | ICD-10-CM | POA: Diagnosis present

## 2021-05-03 DIAGNOSIS — Z5189 Encounter for other specified aftercare: Secondary | ICD-10-CM | POA: Insufficient documentation

## 2021-05-03 DIAGNOSIS — Z5111 Encounter for antineoplastic chemotherapy: Secondary | ICD-10-CM | POA: Insufficient documentation

## 2021-05-03 DIAGNOSIS — Z170 Estrogen receptor positive status [ER+]: Secondary | ICD-10-CM | POA: Diagnosis not present

## 2021-05-03 MED ORDER — SODIUM CHLORIDE 0.9 % IV SOLN: Freq: Once | INTRAVENOUS | Status: AC

## 2021-05-03 MED ORDER — SODIUM CHLORIDE 0.9 % IV SOLN
10.0000 mg | Freq: Once | INTRAVENOUS | Status: AC
  Administered 2021-05-03: 10 mg via INTRAVENOUS
  Filled 2021-05-03: qty 10

## 2021-05-03 MED ORDER — SODIUM CHLORIDE 0.9 % IV SOLN
600.0000 mg/m2 | Freq: Once | INTRAVENOUS | Status: AC
  Administered 2021-05-03: 1220 mg via INTRAVENOUS
  Filled 2021-05-03: qty 61

## 2021-05-03 MED ORDER — PALONOSETRON HCL INJECTION 0.25 MG/5ML
0.2500 mg | Freq: Once | INTRAVENOUS | Status: AC
  Administered 2021-05-03: 0.25 mg via INTRAVENOUS
  Filled 2021-05-03: qty 5

## 2021-05-03 MED ORDER — HEPARIN SOD (PORK) LOCK FLUSH 100 UNIT/ML IV SOLN
500.0000 [IU] | Freq: Once | INTRAVENOUS | Status: AC | PRN
  Administered 2021-05-03: 500 [IU]

## 2021-05-03 MED ORDER — DOXORUBICIN HCL CHEMO IV INJECTION 2 MG/ML
60.0000 mg/m2 | Freq: Once | INTRAVENOUS | Status: AC
  Administered 2021-05-03: 122 mg via INTRAVENOUS
  Filled 2021-05-03: qty 61

## 2021-05-03 MED ORDER — SODIUM CHLORIDE 0.9% FLUSH
10.0000 mL | INTRAVENOUS | Status: DC | PRN
  Administered 2021-05-03: 10 mL

## 2021-05-03 MED ORDER — SODIUM CHLORIDE 0.9 % IV SOLN
150.0000 mg | Freq: Once | INTRAVENOUS | Status: AC
  Administered 2021-05-03: 150 mg via INTRAVENOUS
  Filled 2021-05-03: qty 150

## 2021-05-03 NOTE — Patient Instructions (Signed)
[facility]  Discharge Instructions: ?Thank you for choosing [facility] to provide your oncology and hematology care.  ?If you have a lab appointment with the [facility], please go directly to the [facility] and check in at the registration area. ?  ?Wear comfortable clothing and clothing appropriate for easy access to any Portacath or PICC line.  ? ?We strive to give you quality time with your provider. You may need to reschedule your appointment if you arrive late (15 or more minutes).  Arriving late affects you and other patients whose appointments are after yours.  Also, if you miss three or more appointments without notifying the office, you may be dismissed from the clinic at the provider?s discretion.    ?  ?For prescription refill requests, have your pharmacy contact our office and allow 72 hours for refills to be completed.   ? ?Today you received the following chemotherapy and/or immunotherapy agents Adriamycin,Cytoxan    ?  ?To help prevent nausea and vomiting after your treatment, we encourage you to take your nausea medication as directed. ? ?BELOW ARE SYMPTOMS THAT SHOULD BE REPORTED IMMEDIATELY: ?*FEVER GREATER THAN 100.4 F (38 ?C) OR HIGHER ?*CHILLS OR SWEATING ?*NAUSEA AND VOMITING THAT IS NOT CONTROLLED WITH YOUR NAUSEA MEDICATION ?*UNUSUAL SHORTNESS OF BREATH ?*UNUSUAL BRUISING OR BLEEDING ?*URINARY PROBLEMS (pain or burning when urinating, or frequent urination) ?*BOWEL PROBLEMS (unusual diarrhea, constipation, pain near the anus) ?TENDERNESS IN MOUTH AND THROAT WITH OR WITHOUT PRESENCE OF ULCERS (sore throat, sores in mouth, or a toothache) ?UNUSUAL RASH, SWELLING OR PAIN  ?UNUSUAL VAGINAL DISCHARGE OR ITCHING  ? ?Items with * indicate a potential emergency and should be followed up as soon as possible or go to the Emergency Department if any problems should occur. ? ?Please show the CHEMOTHERAPY ALERT CARD or IMMUNOTHERAPY ALERT CARD at check-in to  the Emergency Department and triage nurse. ? ?Should you have questions after your visit or need to cancel or reschedule your appointment, please contact [facility]  Dept: [phone]  and follow the prompts.  Office hours are 8:00 a.m. to 4:30 p.m. Monday - Friday. Please note that voicemails left after 4:00 p.m. may not be returned until the following business day.  We are closed weekends and major holidays. You have access to a nurse at all times for urgent questions. Please call the main number to the clinic Dept: [phone] and follow the prompts. ? ?For any non-urgent questions, you may also contact your provider using MyChart. We now offer e-Visits for anyone 43 and older to request care online for non-urgent symptoms. For details visit [email or web address]. ?  ?Also download the MyChart app! Go to the app store, search "MyChart", open the app, select [facility], and log in with your MyChart username and password. ? ?Due to Covid, a mask is required upon entering the hospital/clinic. If you do not have a mask, one will be given to you upon arrival. For doctor visits, patients may have 1 support person aged 40 or older with them. For treatment visits, patients cannot have anyone with them due to current Covid guidelines and our immunocompromised population.  ? ? ?

## 2021-05-03 NOTE — Progress Notes (Signed)
1556:PT STABLE AT TIME OF DISCHARGE ?

## 2021-05-04 ENCOUNTER — Ambulatory Visit (INDEPENDENT_AMBULATORY_CARE_PROVIDER_SITE_OTHER): Payer: Medicare Other | Admitting: Cardiology

## 2021-05-04 ENCOUNTER — Encounter: Payer: Self-pay | Admitting: Cardiology

## 2021-05-04 VITALS — BP 114/56 | HR 118 | Ht 64.6 in | Wt 198.4 lb

## 2021-05-04 DIAGNOSIS — E669 Obesity, unspecified: Secondary | ICD-10-CM

## 2021-05-04 DIAGNOSIS — E782 Mixed hyperlipidemia: Secondary | ICD-10-CM

## 2021-05-04 NOTE — Patient Instructions (Signed)

## 2021-05-04 NOTE — Progress Notes (Signed)
?Cardiology Office Note:   ? ?Date:  05/04/2021  ? ?ID:  [name], DOB 08/02/1955, MRN [id number] ? ?PCP:  Enid Skeens., MD  ?Cardiologist:  Jenean Lindau, MD  ? ?Referring MD: Enid Skeens., MD  ? ? ?ASSESSMENT:   ? ?1. Mixed hyperlipidemia   ?2. Obesity, Class I, BMI 30-34.9   ? ?PLAN:   ? ?In order of problems listed above: ? ?Primary prevention stressed to the patient.  Importance of compliance with diet medication stressed and she vocalized understanding.  She was advised to ambulate to the best of her ability. ?Mixed dyslipidemia: Diet was emphasized.  Lifestyle modification urged.  She is doing well and trying to stay with a nutritious diet ?Obesity: Weight reduction stressed diet was emphasized.  And she promises to be attentive to this.  She also mentions to me that her therapy for breast cancer is proceeding well. ?Patient will be seen in follow-up appointment in 9 months or earlier if the patient has any concerns ? ? ? ?Medication Adjustments/Labs and Tests Ordered: ?Current medicines are reviewed at length with the patient today.  Concerns regarding medicines are outlined above.  ?No orders of the defined types were placed in this encounter. ? ?No orders of the defined types were placed in this encounter. ? ? ? ?No chief complaint on file. ?  ? ?History of Present Illness:   ? ?Brandy Maxwell is a 66 y.o. female.  Patient has past medical history of mixed dyslipidemia and obesity.  She has had breast cancer and is undergoing chemotherapy for this.  She denies any problems at this time.  She ambulates with a cane.  No chest pain orthopnea or PND. ? ?Past Medical History:  ?Diagnosis Date  ? Abnormal mammogram of left breast 11/05/2018  ? Allergic rhinitis 08/30/2013  ? Altered consciousness 09/29/2020  ? Arthritis   ? Atypical ductal hyperplasia of right breast 05/21/2017  ? Cardiac murmur 09/23/2019  ? Chronic kidney disease   ? recurrent kidney infections  ? Coccydynia 03/24/2015  ?  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 Depression with anxiety 08/30/2013  ? Diet-controlled diabetes mellitus ([place]) 10/31/2019  ? Difficulty in walking 08/30/2013  ? Disorder of sacrum 02/12/2013  ? Dyspnea on exertion 10/29/2020  ? Epilepsy ([place]) 08/30/2013  ? Excessive daytime sleepiness 07/03/2017  ? Family history of breast cancer   ? Family history of colon cancer   ? Family history of first degree relative with dementia 10/26/2017  ? Family history of lung cancer   ? Family history of prostate cancer   ? Family history of uterine cancer   ? Fibromyalgia   ? Gait abnormality 07/10/2012  ? Genetic testing 03/09/2021  ? Negative genetic testing. No pathogenic variants identified on the [facility] Multi-Cancer+RNA panel. The report date is 03/08/2021.  The Multi-Cancer Panel + RNA offered by [facility] includes sequencing and/or deletion duplication testing of the following 84 genes: AIP, ALK, APC, ATM, AXIN2,BAP1,  BARD1, BLM, BMPR1A, BRCA1, BRCA2, BRIP1, CASR, CDC73, CDH1, CDK4, CDKN1B, CDKN1C, CDKN2A (p14ARF), CDKN2A (  ? Herpes zoster with nervous system complications 93/81/8299  ? History of seizure disorder 03/17/2019  ? Hypercholesteremia   ? Hyperlipidemia 08/30/2013  ? IBS (irritable bowel syndrome)   ? Insomnia 09/07/2015  ? Malignant neoplasm of central portion of left breast in female, estrogen receptor positive ([place]) 10/26/2020  ? Mass of upper outer quadrant of left breast 05/21/2017  ? Medication management 01/31/2019  ? Migraine  headache 08/30/2013  ? Mild cognitive impairment 03/17/2019  ? Myalgia and myositis 02/12/2013  ? Neurogenic bladder 05/21/2020  ? Neuropathic pain 07/22/2020  ? Neuropathy 03/17/2019  ? Obesity, Class I, BMI 30-34.9 03/17/2019  ? Obstructive sleep apnea (adult) (pediatric) 08/30/2013  ? Pain in thoracic spine 02/12/2013  ? Palpitations 09/23/2019  ? 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 Postlaminectomy syndrome, lumbar region  02/12/2013  ? Postoperative examination 06/19/2017  ? Postural hypotension 09/23/2019  ? Restless legs syndrome 05/22/2017  ? Risk for falls 09/07/2015  ? 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 Seizures ([place])   ? Sleep apnea   ? nightly cpap  ? Spondylosis of thoracic region without myelopathy or radiculopathy 02/12/2013  ? Thoracic or lumbosacral neuritis or radiculitis 02/12/2013  ? Thoracic spondylosis with myelopathy 02/12/2013  ? Tremors of nervous system   ? Urinary incontinence 03/17/2019  ? Urinary incontinence, mixed 05/29/2013  ? Vitamin B 12 deficiency 03/17/2019  ? Vitamin D deficiency 03/17/2019  ? Weakness 07/10/2012  ? ? ?Past Surgical History:  ?Procedure Laterality Date  ? BLADDER REPAIR  03/13/2018  ? BLADDER SUSPENSION  03/13/2018  ? BREAST LUMPECTOMY  2019  ? COLONOSCOPY  12/20/2012  ? Left colonic diverticulosis predominantly in the sigmoid colon. Small internal hemmorhoids. Otherwise normal colonoscopy.   ? LAMINECTOMY  1998  ? ? ?Current Medications: ?Current Meds  ?Medication Sig  ? dexamethasone (DECADRON) 4 MG tablet Take 2 tablets (8 mg total) by mouth daily. Take daily for 3 days after chemo. Take with food.  ? dicyclomine (BENTYL) 10 MG capsule Take 10 mg by mouth 3 (three) times daily as needed for pain. With IBS  ? DULoxetine (CYMBALTA) 60 MG capsule Take 60 mg by mouth daily.  ? EPINEPHrine 0.3 mg/0.3 mL IJ SOAJ injection Inject 0.3 mg into the muscle as needed for anaphylaxis.  ? ezetimibe (ZETIA) 10 MG tablet Take 10 mg by mouth daily.  ? fenofibrate 160 MG tablet Take 160 mg by mouth daily.  ? gabapentin (NEURONTIN) 600 MG tablet Take 1,200 mg by mouth 3 (three) times daily.  ? lidocaine (LIDODERM) 5 % Place 1 patch onto the skin as needed for pain.  ? LINZESS 72 MCG capsule Take 1 capsule (72 mcg total) by mouth daily.  ? magic mouthwash (lidocaine, diphenhydrAMINE, alum & mag hydroxide) suspension Take 5 mLs by mouth every 3 (three) hours as  needed. Swish and spit for mouth or throat pain  ? metoprolol succinate (TOPROL-XL) 25 MG 24 hr tablet Take 1 tablet by mouth daily.  ? mirabegron ER (MYRBETRIQ) 50 MG TB24 tablet Take 1 tablet by mouth daily.  ? ondansetron (ZOFRAN) 8 MG tablet Take 1 tablet (8 mg total) by mouth 2 (two) times daily as needed. Start on the third day after chemotherapy.  ? prochlorperazine (COMPAZINE) 10 MG tablet Take 1 tablet (10 mg total) by mouth every 6 (six) hours as needed (Nausea or vomiting).  ? traMADol (ULTRAM) 50 MG tablet Take 50 mg by mouth 2 (two) times daily as needed for pain.  ? Vitamin D, Ergocalciferol, (DRISDOL) 1.25 MG (50000 UT) CAPS capsule TAKE 1 CAPSULE BY MOUTH ONCE A WEEK FOR 30 DAYS  ?  ? ?Allergies:   Nitrofurantoin and Sulfa antibiotics  ? ?Social History  ? ?Socioeconomic History  ? Marital status: Married  ?  Spouse name: Not on file  ? Number of children: Not on file  ? Years of education: Not on file  ?  Highest education level: Not on file  ?Occupational History  ? Not on file  ?Tobacco Use  ? Smoking status: Never  ? Smokeless tobacco: Never  ?Vaping Use  ? Vaping Use: Never used  ?Substance and Sexual Activity  ? Alcohol use: Never  ? Drug use: Never  ? Sexual activity: Not on file  ?Other Topics Concern  ? Not on file  ?Social History Narrative  ? Not on file  ? ?Social Determinants of Health  ? ?Financial Resource Strain: Not on file  ?Food Insecurity: Not on file  ?Transportation Needs: Not on file  ?Physical Activity: Not on file  ?Stress: Not on file  ?Social Connections: Not on file  ?  ? ?Family History: ?The patient's family history includes Allergic rhinitis in her sister; Alzheimer's disease in her father and mother; Brain cancer in her paternal grandfather; Breast cancer in her cousin and maternal aunt; Breast cancer (age of onset: 12) in her paternal aunt; Breast cancer (age of onset: 70) in her maternal aunt; Clotting disorder in her father; Colon cancer in her cousin and maternal  grandfather; Heart Problems in her father; Hemophilia in her maternal aunt; Hypercholesterolemia in her mother; Kidney cancer in her paternal uncle; Liver cancer in her paternal uncle; Lung cancer in her maternal uncle; L

## 2021-05-06 ENCOUNTER — Inpatient Hospital Stay: Payer: Medicare Other

## 2021-05-06 VITALS — BP 116/72 | HR 108 | Temp 98.7°F | Resp 18 | Ht 64.0 in | Wt 181.2 lb

## 2021-05-06 DIAGNOSIS — Z5111 Encounter for antineoplastic chemotherapy: Secondary | ICD-10-CM | POA: Diagnosis not present

## 2021-05-06 DIAGNOSIS — Z170 Estrogen receptor positive status [ER+]: Secondary | ICD-10-CM

## 2021-05-06 MED ORDER — PEGFILGRASTIM-CBQV 6 MG/0.6ML ~~LOC~~ SOSY
6.0000 mg | PREFILLED_SYRINGE | Freq: Once | SUBCUTANEOUS | Status: AC
  Administered 2021-05-06: 6 mg via SUBCUTANEOUS
  Filled 2021-05-06: qty 0.6

## 2021-05-06 NOTE — Patient Instructions (Signed)

## 2021-05-16 ENCOUNTER — Encounter: Payer: Self-pay | Admitting: Hematology and Oncology

## 2021-05-23 ENCOUNTER — Inpatient Hospital Stay: Payer: Medicare Other

## 2021-05-23 ENCOUNTER — Other Ambulatory Visit: Payer: Self-pay | Admitting: Oncology

## 2021-05-23 ENCOUNTER — Other Ambulatory Visit: Payer: Self-pay | Admitting: Hematology and Oncology

## 2021-05-23 ENCOUNTER — Encounter: Payer: Self-pay | Admitting: Oncology

## 2021-05-23 ENCOUNTER — Inpatient Hospital Stay: Payer: Medicare Other | Admitting: Oncology

## 2021-05-23 ENCOUNTER — Inpatient Hospital Stay (INDEPENDENT_AMBULATORY_CARE_PROVIDER_SITE_OTHER): Payer: Medicare Other | Admitting: Oncology

## 2021-05-23 VITALS — BP 123/56 | HR 82 | Temp 98.1°F | Resp 18 | Ht 64.0 in | Wt 193.8 lb

## 2021-05-23 DIAGNOSIS — C773 Secondary and unspecified malignant neoplasm of axilla and upper limb lymph nodes: Secondary | ICD-10-CM | POA: Diagnosis not present

## 2021-05-23 DIAGNOSIS — G629 Polyneuropathy, unspecified: Secondary | ICD-10-CM | POA: Diagnosis not present

## 2021-05-23 DIAGNOSIS — Z170 Estrogen receptor positive status [ER+]: Secondary | ICD-10-CM

## 2021-05-23 DIAGNOSIS — C50112 Malignant neoplasm of central portion of left female breast: Secondary | ICD-10-CM

## 2021-05-23 LAB — BASIC METABOLIC PANEL
BUN: 13 (ref 4–21)
CO2: 24 — AB (ref 13–22)
Chloride: 100 (ref 99–108)
Creatinine: 0.7 (ref 0.5–1.1)
Glucose: 137
Potassium: 4.2 mEq/L (ref 3.5–5.1)
Sodium: 138 (ref 137–147)

## 2021-05-23 LAB — HEPATIC FUNCTION PANEL
ALT: 33 U/L (ref 7–35)
AST: 37 — AB (ref 13–35)
Alkaline Phosphatase: 56 (ref 25–125)
Bilirubin, Total: 0.8

## 2021-05-23 LAB — CBC AND DIFFERENTIAL
HCT: 39 (ref 36–46)
Hemoglobin: 13.4 (ref 12.0–16.0)
Neutrophils Absolute: 3.47
Platelets: 401 10*3/uL — AB (ref 150–400)
WBC: 6.2

## 2021-05-23 LAB — CBC: RBC: 4.05 (ref 3.87–5.11)

## 2021-05-23 LAB — COMPREHENSIVE METABOLIC PANEL
Albumin: 4.6 (ref 3.5–5.0)
Calcium: 10.3 (ref 8.7–10.7)

## 2021-05-23 MED ORDER — DEXAMETHASONE 4 MG PO TABS: ORAL_TABLET | ORAL | 0 refills | Status: DC

## 2021-05-23 MED ORDER — PREMARIN 0.625 MG/GM VA CREA: 1.0000 | TOPICAL_CREAM | Freq: Every day | VAGINAL | 12 refills | Status: DC

## 2021-05-23 NOTE — Progress Notes (Signed)
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 She has undergone left mastectomy but one node was positive for malignancy. We recommend hormonal therapy. EndoPredict has revealed her to be extremely high risk with an EP clin score of 5.4, and so we recommend chemotherapy with AC every 3 weeks followed by weekly Taxol. Her risk of recurrence in the next 10 years is 55% and she has an estimated chemotherapy benefit of 27%.  She has completed her [facility] chemotherapy for 4 cycles and now will start weekly paclitaxel.  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 She is at the max dose of gabapentin at this time, and takes duloxetine as well. She states the pain is tolerable at this point.  I am concerned about the potential for worsening with the paclitaxel so we will monitor this closely.  Recurrent urinary tract infections She has had numerous infections and has to do self-catheterization.  I feel atrophic vaginitis is contributing to this and so we will order her Premarin cream to use locally since she is having bleeding with the catheterization.  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  I  reviewed the potential toxicities and precautions.  We are both concerned as to whether she will be able to tolerate this since she already has grade 2 neuropathy and is on multiple medications for this.  I also reviewed the need for dexamethasone to take the night before and the morning of her chemotherapy and we will send that prescription in.  I will also order Premarin cream for her atrophic vaginitis.  We will see her back in 1 week with CBC and comprehensive metabolic profile to see how she tolerated her first dose of paclitaxel. The patient understands the plans discussed today and is in agreement with them.  She knows to contact our office if she develops concerns prior to her next appointment.    [name], MD  [facility] [address] [place] [address] Dept: [phone] Dept Fax: [phone]     CHIEF COMPLAINT:  CC: Breast cancer  Current Treatment:  Adjuvant Taxol  INTERVAL HISTORY:  Brandy Maxwell is here today for repeat clinical assessment.  She has now completed 4 cycles of AC chemotherapy and is ready to start her paclitaxel this week.  I reviewed the toxicities and schedule with her again and will order dexamethasone 4 mg to take 3 the night before and through the morning of her chemotherapy days.  She does already have grade 2 neuropathy and so I do not think she will be able to tolerate the full 12 doses but we will see how much she can tolerate.  He is already on gabapentin and duloxetine for  this.  She does complain of some pain in the back and pelvis and had a bladder infection.  She just finished a course of cefdinir and has had recurrent urinary tract infections.  She has to catheterize herself and notices bleeding and irritation of the area due to atrophic vaginitis.  CBC is normal other than a platelet count of 401,000.  Comprehensive metabolic profile reveals a mildly elevated calcium of 10.3 and  nonfasting blood sugar of 137.  Her appetite is good but her weight is down 4-1/2 pounds.  She denies fevers, chills, night sweats.  She denies pain.  She denies dyspnea, cough or chest pain.  She denies nausea, vomiting bowel problems, or abdominal pain.  I have reviewed the past medical history, past surgical history, social history and family history with the patient and they are unchanged from previous note.  ALLERGIES:  is allergic to nitrofurantoin and sulfa antibiotics.  MEDICATIONS:  Current Outpatient Medications  Medication Sig Dispense Refill   dexamethasone (DECADRON) 4 MG tablet Take 3 tabs at the night before and 3 tab the morning of chemotherapy 60 tablet 0   dicyclomine (BENTYL) 10 MG capsule Take 10 mg by mouth 3 (three) times daily as needed for pain. With IBS     DULoxetine (CYMBALTA) 60 MG capsule Take 1 capsule by mouth at bedtime.     EPINEPHrine 0.3 mg/0.3 mL IJ SOAJ injection Inject 0.3 mg into the muscle as needed for anaphylaxis.     estradiol (ESTRACE) 0.1 MG/GM vaginal cream Place 1 Applicatorful vaginally at bedtime. 42.5 g 0   ezetimibe (ZETIA) 10 MG tablet Take 10 mg by mouth daily.     fenofibrate 160 MG tablet Take 160 mg by mouth daily.     gabapentin (NEURONTIN) 600 MG tablet Take 1,200 mg by mouth 3 (three) times daily.     lidocaine (LIDODERM) 5 % Place 1 patch onto the skin as needed for pain.     LINZESS 72 MCG capsule Take 1 capsule (72 mcg total) by mouth daily. 90 capsule 4   magic mouthwash (lidocaine, diphenhydrAMINE, alum & mag hydroxide) suspension Take 5 mLs by mouth every 3 (three) hours as needed. Swish and spit for mouth or throat pain     metoprolol succinate (TOPROL-XL) 25 MG 24 hr tablet Take 1 tablet by mouth daily.     mirabegron ER (MYRBETRIQ) 50 MG TB24 tablet Take 1 tablet by mouth daily.     ondansetron (ZOFRAN) 8 MG tablet Take 1 tablet (8 mg total) by mouth 2 (two) times daily as needed. Start on the third day after chemotherapy. 30  tablet 1   prochlorperazine (COMPAZINE) 10 MG tablet Take 1 tablet (10 mg total) by mouth every 6 (six) hours as needed (Nausea or vomiting). 30 tablet 1   traMADol (ULTRAM) 50 MG tablet Take 50 mg by mouth 2 (two) times daily as needed for pain.     Vitamin D, Ergocalciferol, (DRISDOL) 1.25 MG (50000 UT) CAPS capsule TAKE 1 CAPSULE BY MOUTH ONCE A WEEK FOR 30 DAYS     No current facility-administered medications for this visit.    HISTORY OF PRESENT ILLNESS:   Oncology History  Malignant neoplasm of central portion of left breast in female, estrogen receptor positive ([place])  09/16/2020 Mammogram   DIGITAL SCREENING BILATERAL MAMMOGRAM: In the left breast, a possible mass warrants further evaluation. In the right breast, no findings suspicious for malignancy.    10/08/2020 Mammogram   DIAGNOSTIC UNILATERAL LEFT MAMMOGRAM  AND LEFT BREAST ULTRASOUND: There is architectural distortion at the left breast 12 o'clock.  Targeted ultrasound is performed, showing no focal abnormal discrete cystic or solid lesion at left breast 12 o'clock the correlate to the mammographic finding. Ultrasound of the left axilla is negative.   10/20/2020 Pathology Results   Breast, left, needle core biopsy, 12 o'clock:             Invasive mammary carcinoma             Mammary carcinoma in situ HER2: Negative (0) ER: Positive 30% PR: Negative 0% Ki67: 1%   10/26/2020 Initial Diagnosis   Malignant neoplasm of central portion of left breast in female, estrogen receptor positive ([place])    12/17/2020 Cancer Staging   Staging form: Breast, AJCC 8th Edition - Clinical stage from 12/17/2020: Stage IIA (cT1b, cN1(sn), cM0, G2, ER+, PR-, HER2-) - Signed by [name], MD on 02/13/2021 Histopathologic type: Infiltrating duct carcinoma, NOS Stage prefix: Initial diagnosis Method of lymph node assessment: Sentinel lymph node biopsy Nuclear grade: G2 Multigene prognostic tests performed: EndoPredict Histologic  grading system: 3 grade system Laterality: Left Tumor size (mm): 7 Lymph-vascular invasion (LVI): LVI not present (absent)/not identified Diagnostic confirmation: Positive histology Specimen type: Excision Staged by: Managing physician Menopausal status: Postmenopausal Ki-67 (%): 1 EndoPredict EPclin risk score: 5.4 EndoPredict EPclin risk level: High risk EndoPredict 10-year percentage risk of distant recurrence (%): 55 EndoPredict 10-year risk of distant recurrence: High risk Stage used in treatment planning: Yes National guidelines used in treatment planning: Yes Type of national guideline used in treatment planning: NCCN Staging comments: Endopredict with 27% benefit from chemotherapy    12/17/2020 Pathology Results   Breast, simple mastectomy, left:         Invasive ductal carcinoma, grade 2, 7 mm, and ductal carcinoma in situ         Ribbon clip and biopsy reaction present         All margins negative for invasive carcinoma and DCIS         Fibrocystic changes Lymph node, sentinel, biopsy, left axillary:         Tumor present in regional lymph node (1/1)   02/18/2021 -  Chemotherapy   Patient is on Treatment Plan : Breast AC q21 Days / PACLitaxel q7d       Genetic Testing   Negative genetic testing. No pathogenic variants identified on the [facility] Multi-Cancer+RNA panel. The report date is 03/08/2021.  The Multi-Cancer Panel + RNA offered by [facility] includes sequencing and/or deletion duplication testing of the following 84 genes: AIP, ALK, APC, ATM, AXIN2,BAP1,  BARD1, BLM, BMPR1A, BRCA1, BRCA2, BRIP1, CASR, CDC73, CDH1, CDK4, CDKN1B, CDKN1C, CDKN2A (p14ARF), CDKN2A (p16INK4a), CEBPA, CHEK2, CTNNA1, DICER1, DIS3L2, EGFR (c.2369C>T, p.Thr790Met variant only), EPCAM (Deletion/duplication testing only), FH, FLCN, GATA2, GPC3, GREM1 (Promoter region deletion/duplication testing only), HOXB13 (c.251G>A, p.Gly84Glu), HRAS, KIT, MAX, MEN1, MET, MITF (c.952G>A, p.Glu318Lys variant  only), MLH1, MSH2, MSH3, MSH6, MUTYH, NBN, NF1, NF2, NTHL1, PALB2, PDGFRA, PHOX2B, PMS2, POLD1, POLE, POT1, PRKAR1A, PTCH1, PTEN, RAD50, RAD51C, RAD51D, RB1, RECQL4, RET, RUNX1, SDHAF2, SDHA (sequence changes only), SDHB, SDHC, SDHD, SMAD4, SMARCA4, SMARCB1, SMARCE1, STK11, SUFU, TERC, TERT, TMEM127, TP53, TSC1, TSC2, VHL, WRN and WT1.       REVIEW OF SYSTEMS:   Constitutional: Denies fevers, chills or abnormal weight loss Eyes: Denies blurriness of vision Ears, nose, mouth, throat, and face: Denies mucositis or sore throat Respiratory: Denies cough, dyspnea or wheezes Cardiovascular: Denies palpitation, chest discomfort  or lower extremity swelling Gastrointestinal:  Denies nausea, heartburn or change in bowel habits Genitourinary: She has recurrent urinary tract infections and has to do self-catheterization Skin: Denies abnormal skin rashes Lymphatics: Denies new lymphadenopathy or easy bruising Neurological:Denies imbalance, dizziness or new weaknesses However, she does have neuropathic pain of her lower extremities. She has an unsteady gait and requires a cane to ambulate. Behavioral/Psych: Mood is stable, no new changes  All other systems were reviewed with the patient and are negative.   VITALS:  Blood pressure (!) 123/56, pulse 82, temperature 98.1 F (36.7 C), temperature source Oral, resp. rate 18, height $RemoveBe'5\' 4"'lIWmhZrFZ$  (1.626 m), weight 193 lb 12.8 oz (87.9 kg), SpO2 94 %.  Wt Readings from Last 3 Encounters:  06/03/21 190 lb 8 oz (86.4 kg)  06/01/21 198 lb (89.8 kg)  05/27/21 193 lb 9.6 oz (87.8 kg)    Body mass index is 33.27 kg/m.  Performance status (ECOG): 1 - Symptomatic but completely ambulatory  PHYSICAL EXAM:   GENERAL:alert, no distress and comfortable SKIN: skin color, texture, turgor are normal, no rashes or significant lesions EYES: normal, Conjunctiva are pink and non-injected, sclera clear OROPHARYNX:no exudate, no erythema and lips, buccal mucosa, and tongue  normal  NECK: supple, thyroid normal size, non-tender, without nodularity LYMPH:  no palpable lymphadenopathy in the cervical, axillary or inguinal LUNGS: clear to auscultation and percussion with normal breathing effort HEART: regular rate & rhythm and no murmurs and no lower extremity edema CHEST:Well healed scar of the lateral right breast. No masses in the breast.  Left mastectomy is negative. ABD: :abdomen soft, non-tender and normal bowel sounds Musculoskeletal:no cyanosis of digits and no clubbing  NEURO: alert & oriented x 3 with fluent speech, no focal motor/sensory deficits  LABORATORY DATA:  I have reviewed the data as listed    Component Value Date/Time   NA 137 06/03/2021 0000   K 3.9 06/03/2021 0000   CL 101 06/03/2021 0000   CO2 24 (A) 06/03/2021 0000   BUN 21 06/03/2021 0000   CREATININE 0.7 06/03/2021 0000   CALCIUM 9.0 06/03/2021 0000   ALBUMIN 4.2 06/03/2021 0000   AST 39 (A) 06/03/2021 0000   ALT 41 (A) 06/03/2021 0000   ALKPHOS 46 06/03/2021 0000    No results found for: SPEP, UPEP  Lab Results  Component Value Date   WBC 4.0 06/03/2021   NEUTROABS 2.52 06/03/2021   HGB 12.2 06/03/2021   HCT 36 06/03/2021   MCV 97 06/03/2021   PLT 215 06/03/2021      Chemistry      Component Value Date/Time   NA 137 06/03/2021 0000   K 3.9 06/03/2021 0000   CL 101 06/03/2021 0000   CO2 24 (A) 06/03/2021 0000   BUN 21 06/03/2021 0000   CREATININE 0.7 06/03/2021 0000   GLU 164 06/03/2021 0000      Component Value Date/Time   CALCIUM 9.0 06/03/2021 0000   ALKPHOS 46 06/03/2021 0000   AST 39 (A) 06/03/2021 0000   ALT 41 (A) 06/03/2021 0000       RADIOGRAPHIC STUDIES: No results found.

## 2021-05-23 NOTE — Progress Notes (Deleted)
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 She has undergone left mastectomy but one node was positive for malignancy. We recommend hormonal therapy. EndoPredict has revealed her to be extremely high risk with an EP clin score of 5.4, and so we recommend chemotherapy with AC every 3 weeks followed by weekly Taxol. Her risk of recurrence in the next 10 years is 55% and she has an estimated chemotherapy benefit of 27%. She has completed her Levaquin and reports feeling much better. We will proceed with cycle 4 next week. She has several teeth that are infected and she feels this was the cause of her fever last week. I advised that she have this done during her week off of chemotherapy, preferably after the 10 day nadir. We will see her back in 3 weeks for repeat evaluation.   Neuropathic pain She continues to have ongoing neuropathy. She is at the max dose of gabapentin at this time. She states the pain is tolerable at this point.     The patient understands the plans discussed today and is in agreement with them.  She knows to contact our office if she develops concerns prior to her next appointment.    [name], MD  [facility] [address] [place] [address] Dept: [phone] Dept  Fax: [phone]      CHIEF COMPLAINT:  CC: A 66 year old female with history of breast cancer here for 1 week evaluation  Current Treatment:  AC   INTERVAL HISTORY:  Brandy Maxwell is here today for repeat clinical assessment. She denies fevers or chills. She denies pain. Her appetite is good. Her weight has been stable.  I have reviewed the past medical history, past surgical history, social history and family history with the patient and they are unchanged from previous note.  ALLERGIES:  is allergic to nitrofurantoin and sulfa antibiotics.  MEDICATIONS:  Current Outpatient Medications  Medication Sig Dispense Refill   dexamethasone (DECADRON) 4 MG tablet Take 2 tablets (8 mg total) by mouth daily. Take daily for 3 days after chemo. Take with food. 30 tablet 1   dicyclomine (BENTYL) 10 MG capsule Take 10 mg by mouth 3 (three) times daily as needed for pain. With IBS     DULoxetine (CYMBALTA) 60 MG capsule Take 60 mg by mouth daily.     EPINEPHrine 0.3 mg/0.3 mL IJ SOAJ injection Inject 0.3 mg into the muscle as needed for anaphylaxis.     ezetimibe (ZETIA) 10 MG tablet Take 10 mg by mouth daily.     fenofibrate 160 MG tablet Take 160 mg by mouth daily.     gabapentin (NEURONTIN) 600 MG tablet Take 1,200 mg by mouth 3 (three) times daily.     lidocaine (LIDODERM) 5 % Place 1 patch onto the skin as needed for pain.     LINZESS 72 MCG capsule Take 1  capsule (72 mcg total) by mouth daily. 90 capsule 4   magic mouthwash (lidocaine, diphenhydrAMINE, alum & mag hydroxide) suspension Take 5 mLs by mouth every 3 (three) hours as needed. Swish and spit for mouth or throat pain     metoprolol succinate (TOPROL-XL) 25 MG 24 hr tablet Take 1 tablet by mouth daily.     mirabegron ER (MYRBETRIQ) 50 MG TB24 tablet Take 1 tablet by mouth daily.     ondansetron (ZOFRAN) 8 MG tablet Take 1 tablet (8 mg total) by mouth 2 (two) times daily as needed. Start on the third day after chemotherapy. 30 tablet 1    prochlorperazine (COMPAZINE) 10 MG tablet Take 1 tablet (10 mg total) by mouth every 6 (six) hours as needed (Nausea or vomiting). 30 tablet 1   traMADol (ULTRAM) 50 MG tablet Take 50 mg by mouth 2 (two) times daily as needed for pain.     Vitamin D, Ergocalciferol, (DRISDOL) 1.25 MG (50000 UT) CAPS capsule TAKE 1 CAPSULE BY MOUTH ONCE A WEEK FOR 30 DAYS     No current facility-administered medications for this visit.    HISTORY OF PRESENT ILLNESS:   Oncology History  Malignant neoplasm of central portion of left breast in female, estrogen receptor positive ([place])  09/16/2020 Mammogram   DIGITAL SCREENING BILATERAL MAMMOGRAM: In the left breast, a possible mass warrants further evaluation. In the right breast, no findings suspicious for malignancy.    10/08/2020 Mammogram   DIAGNOSTIC UNILATERAL LEFT MAMMOGRAM AND LEFT BREAST ULTRASOUND: There is architectural distortion at the left breast 12 o'clock.  Targeted ultrasound is performed, showing no focal abnormal discrete cystic or solid lesion at left breast 12 o'clock the correlate to the mammographic finding. Ultrasound of the left axilla is negative.   10/20/2020 Pathology Results   Breast, left, needle core biopsy, 12 o'clock:             Invasive mammary carcinoma             Mammary carcinoma in situ HER2: Negative (0) ER: Positive 30% PR: Negative 0% Ki67: 1%   10/26/2020 Initial Diagnosis   Malignant neoplasm of central portion of left breast in female, estrogen receptor positive ([place])    12/17/2020 Cancer Staging   Staging form: Breast, AJCC 8th Edition - Clinical stage from 12/17/2020: Stage IIA (cT1b, cN1(sn), cM0, G2, ER+, PR-, HER2-) - Signed by [name], MD on 02/13/2021 Histopathologic type: Infiltrating duct carcinoma, NOS Stage prefix: Initial diagnosis Method of lymph node assessment: Sentinel lymph node biopsy Nuclear grade: G2 Multigene prognostic tests performed: EndoPredict Histologic grading  system: 3 grade system Laterality: Left Tumor size (mm): 7 Lymph-vascular invasion (LVI): LVI not present (absent)/not identified Diagnostic confirmation: Positive histology Specimen type: Excision Staged by: Managing physician Menopausal status: Postmenopausal Ki-67 (%): 1 EndoPredict EPclin risk score: 5.4 EndoPredict EPclin risk level: High risk EndoPredict 10-year percentage risk of distant recurrence (%): 55 EndoPredict 10-year risk of distant recurrence: High risk Stage used in treatment planning: Yes National guidelines used in treatment planning: Yes Type of national guideline used in treatment planning: NCCN Staging comments: Endopredict with 27% benefit from chemotherapy    12/17/2020 Pathology Results   Breast, simple mastectomy, left:         Invasive ductal carcinoma, grade 2, 7 mm, and ductal carcinoma in situ         Ribbon clip and biopsy reaction present         All margins negative for invasive carcinoma  and DCIS         Fibrocystic changes Lymph node, sentinel, biopsy, left axillary:         Tumor present in regional lymph node (1/1)   02/18/2021 -  Chemotherapy   Patient is on Treatment Plan : Breast AC q21 Days / PACLitaxel q7d       Genetic Testing   Negative genetic testing. No pathogenic variants identified on the [facility] Multi-Cancer+RNA panel. The report date is 03/08/2021.  The Multi-Cancer Panel + RNA offered by [facility] includes sequencing and/or deletion duplication testing of the following 84 genes: AIP, ALK, APC, ATM, AXIN2,BAP1,  BARD1, BLM, BMPR1A, BRCA1, BRCA2, BRIP1, CASR, CDC73, CDH1, CDK4, CDKN1B, CDKN1C, CDKN2A (p14ARF), CDKN2A (p16INK4a), CEBPA, CHEK2, CTNNA1, DICER1, DIS3L2, EGFR (c.2369C>T, p.Thr790Met variant only), EPCAM (Deletion/duplication testing only), FH, FLCN, GATA2, GPC3, GREM1 (Promoter region deletion/duplication testing only), HOXB13 (c.251G>A, p.Gly84Glu), HRAS, KIT, MAX, MEN1, MET, MITF (c.952G>A, p.Glu318Lys variant only), MLH1,  MSH2, MSH3, MSH6, MUTYH, NBN, NF1, NF2, NTHL1, PALB2, PDGFRA, PHOX2B, PMS2, POLD1, POLE, POT1, PRKAR1A, PTCH1, PTEN, RAD50, RAD51C, RAD51D, RB1, RECQL4, RET, RUNX1, SDHAF2, SDHA (sequence changes only), SDHB, SDHC, SDHD, SMAD4, SMARCA4, SMARCB1, SMARCE1, STK11, SUFU, TERC, TERT, TMEM127, TP53, TSC1, TSC2, VHL, WRN and WT1.       REVIEW OF SYSTEMS:   Constitutional: Denies fevers, chills or abnormal weight loss Eyes: Denies blurriness of vision Ears, nose, mouth, throat, and face: Denies mucositis or sore throat Respiratory: Denies cough, dyspnea or wheezes Cardiovascular: Denies palpitation, chest discomfort or lower extremity swelling Gastrointestinal:  Denies nausea, heartburn or change in bowel habits Skin: Denies abnormal skin rashes Lymphatics: Denies new lymphadenopathy or easy bruising Neurological:Denies numbness, tingling or new weaknesses Behavioral/Psych: Mood is stable, no new changes  All other systems were reviewed with the patient and are negative.   VITALS:  There were no vitals taken for this visit.  Wt Readings from Last 3 Encounters:  05/06/21 181 lb 3.2 oz (82.2 kg)  05/04/21 198 lb 6.4 oz (90 kg)  05/03/21 193 lb 4 oz (87.7 kg)    There is no height or weight on file to calculate BMI.  Performance status (ECOG): 1 - Symptomatic but completely ambulatory  PHYSICAL EXAM:   GENERAL:alert, no distress and comfortable SKIN: skin color, texture, turgor are normal, no rashes or significant lesions EYES: normal, Conjunctiva are pink and non-injected, sclera clear OROPHARYNX:no exudate, no erythema and lips, buccal mucosa, and tongue normal  NECK: supple, thyroid normal size, non-tender, without nodularity LYMPH:  no palpable lymphadenopathy in the cervical, axillary or inguinal LUNGS: clear to auscultation and percussion with normal breathing effort HEART: regular rate & rhythm and no murmurs and no lower extremity edema ABDOMEN:abdomen soft, non-tender and  normal bowel sounds Musculoskeletal:no cyanosis of digits and no clubbing  NEURO: alert & oriented x 3 with fluent speech, no focal motor/sensory deficits  LABORATORY DATA:  I have reviewed the data as listed    Component Value Date/Time   NA 136 (A) 04/29/2021 0000   K 4.4 04/29/2021 0000   CL 102 04/29/2021 0000   CO2 24 (A) 04/29/2021 0000   BUN 11 04/29/2021 0000   CREATININE 0.6 04/29/2021 0000   CALCIUM 9.2 04/29/2021 0000   ALBUMIN 4.3 04/29/2021 0000   AST 34 04/29/2021 0000   ALT 28 04/29/2021 0000   ALKPHOS 54 04/29/2021 0000    No results found for: SPEP, UPEP  Lab Results  Component Value Date   WBC 5.1 04/29/2021   NEUTROABS 2.91 04/29/2021  HGB 13.0 04/29/2021   HCT 39 04/29/2021   MCV 94 02/03/2021   PLT 270 04/29/2021      Chemistry      Component Value Date/Time   NA 136 (A) 04/29/2021 0000   K 4.4 04/29/2021 0000   CL 102 04/29/2021 0000   CO2 24 (A) 04/29/2021 0000   BUN 11 04/29/2021 0000   CREATININE 0.6 04/29/2021 0000   GLU 146 04/29/2021 0000      Component Value Date/Time   CALCIUM 9.2 04/29/2021 0000   ALKPHOS 54 04/29/2021 0000   AST 34 04/29/2021 0000   ALT 28 04/29/2021 0000       RADIOGRAPHIC STUDIES: I have personally reviewed the radiological images as listed and agreed with the findings in the report. No results found.

## 2021-05-24 ENCOUNTER — Encounter: Payer: Self-pay | Admitting: Oncology

## 2021-05-24 ENCOUNTER — Ambulatory Visit: Payer: Self-pay

## 2021-05-24 ENCOUNTER — Ambulatory Visit: Payer: Medicare Other

## 2021-05-25 MED FILL — Paclitaxel IV Conc 300 MG/50ML (6 MG/ML): INTRAVENOUS | Qty: 27 | Status: AC

## 2021-05-25 MED FILL — Dexamethasone Sodium Phosphate Inj 100 MG/10ML: INTRAMUSCULAR | Qty: 1 | Status: AC

## 2021-05-26 ENCOUNTER — Inpatient Hospital Stay: Payer: Medicare Other

## 2021-05-26 VITALS — BP 121/74 | HR 88 | Temp 98.1°F | Resp 18 | Ht 64.0 in | Wt 195.0 lb

## 2021-05-26 DIAGNOSIS — C50112 Malignant neoplasm of central portion of left female breast: Secondary | ICD-10-CM

## 2021-05-26 DIAGNOSIS — Z5111 Encounter for antineoplastic chemotherapy: Secondary | ICD-10-CM | POA: Diagnosis not present

## 2021-05-26 MED ORDER — DIPHENHYDRAMINE HCL 50 MG/ML IJ SOLN
50.0000 mg | Freq: Once | INTRAMUSCULAR | Status: AC
  Administered 2021-05-26: 50 mg via INTRAVENOUS
  Filled 2021-05-26: qty 1

## 2021-05-26 MED ORDER — HEPARIN SOD (PORK) LOCK FLUSH 100 UNIT/ML IV SOLN
500.0000 [IU] | Freq: Once | INTRAVENOUS | Status: AC | PRN
  Administered 2021-05-26: 500 [IU]

## 2021-05-26 MED ORDER — FAMOTIDINE IN NACL 20-0.9 MG/50ML-% IV SOLN
20.0000 mg | Freq: Once | INTRAVENOUS | Status: AC
  Administered 2021-05-26: 20 mg via INTRAVENOUS
  Filled 2021-05-26: qty 50

## 2021-05-26 MED ORDER — SODIUM CHLORIDE 0.9 % IV SOLN: Freq: Once | INTRAVENOUS | Status: AC

## 2021-05-26 MED ORDER — SODIUM CHLORIDE 0.9 % IV SOLN
10.0000 mg | Freq: Once | INTRAVENOUS | Status: AC
  Administered 2021-05-26: 10 mg via INTRAVENOUS
  Filled 2021-05-26: qty 10

## 2021-05-26 MED ORDER — SODIUM CHLORIDE 0.9% FLUSH
10.0000 mL | INTRAVENOUS | Status: DC | PRN
  Administered 2021-05-26: 10 mL

## 2021-05-26 MED ORDER — SODIUM CHLORIDE 0.9 % IV SOLN
80.0000 mg/m2 | Freq: Once | INTRAVENOUS | Status: AC
  Administered 2021-05-26: 162 mg via INTRAVENOUS
  Filled 2021-05-26: qty 27

## 2021-05-26 NOTE — Patient Instructions (Signed)
Paclitaxel injection What is this medication? PACLITAXEL (PAK li TAX el) is a chemotherapy drug. It targets fast dividing cells, like cancer cells, and causes these cells to die. This medicine is used to treat ovarian cancer, breast cancer, lung cancer, Kaposi's sarcoma, and other cancers. This medicine may be used for other purposes; ask your health care provider or pharmacist if you have questions. COMMON BRAND NAME(S): Onxol, Taxol What should I tell my care team before I take this medication? They need to know if you have any of these conditions: history of irregular heartbeat liver disease low blood counts, like low white cell, platelet, or red cell counts lung or breathing disease, like asthma tingling of the fingers or toes, or other nerve disorder an unusual or allergic reaction to paclitaxel, alcohol, polyoxyethylated castor oil, other chemotherapy, other medicines, foods, dyes, or preservatives pregnant or trying to get pregnant breast-feeding How should I use this medication? This drug is given as an infusion into a vein. It is administered in a hospital or clinic by a specially trained health care professional. Talk to your pediatrician regarding the use of this medicine in children. Special care may be needed. Overdosage: If you think you have taken too much of this medicine contact a poison control center or emergency room at once. NOTE: This medicine is only for you. Do not share this medicine with others. What if I miss a dose? It is important not to miss your dose. Call your doctor or health care professional if you are unable to keep an appointment. What may interact with this medication? Do not take this medicine with any of the following medications: live virus vaccines This medicine may also interact with the following medications: antiviral medicines for hepatitis, HIV or AIDS certain antibiotics like erythromycin and clarithromycin certain medicines for fungal  infections like ketoconazole and itraconazole certain medicines for seizures like carbamazepine, phenobarbital, phenytoin gemfibrozil nefazodone rifampin [place]'s wort This list may not describe all possible interactions. Give your health care provider a list of all the medicines, herbs, non-prescription drugs, or dietary supplements you use. Also tell them if you smoke, drink alcohol, or use illegal drugs. Some items may interact with your medicine. What should I watch for while using this medication? Your condition will be monitored carefully while you are receiving this medicine. You will need important blood work done while you are taking this medicine. This medicine can cause serious allergic reactions. To reduce your risk you will need to take other medicine(s) before treatment with this medicine. If you experience allergic reactions like skin rash, itching or hives, swelling of the face, lips, or tongue, tell your doctor or health care professional right away. In some cases, you may be given additional medicines to help with side effects. Follow all directions for their use. This drug may make you feel generally unwell. This is not uncommon, as chemotherapy can affect healthy cells as well as cancer cells. Report any side effects. Continue your course of treatment even though you feel ill unless your doctor tells you to stop. Call your doctor or health care professional for advice if you get a fever, chills or sore throat, or other symptoms of a cold or flu. Do not treat yourself. This drug decreases your body's ability to fight infections. Try to avoid being around people who are sick. This medicine may increase your risk to bruise or bleed. Call your doctor or health care professional if you notice any unusual bleeding. Be careful brushing  and flossing your teeth or using a toothpick because you may get an infection or bleed more easily. If you have any dental work done, tell your dentist  you are receiving this medicine. Avoid taking products that contain aspirin, acetaminophen, ibuprofen, naproxen, or ketoprofen unless instructed by your doctor. These medicines may hide a fever. Do not become pregnant while taking this medicine. Women should inform their doctor if they wish to become pregnant or think they might be pregnant. There is a potential for serious side effects to an unborn child. Talk to your health care professional or pharmacist for more information. Do not breast-feed an infant while taking this medicine. Men are advised not to father a child while receiving this medicine. This product may contain alcohol. Ask your pharmacist or healthcare provider if this medicine contains alcohol. Be sure to tell all healthcare providers you are taking this medicine. Certain medicines, like metronidazole and disulfiram, can cause an unpleasant reaction when taken with alcohol. The reaction includes flushing, headache, nausea, vomiting, sweating, and increased thirst. The reaction can last from 30 minutes to several hours. What side effects may I notice from receiving this medication? Side effects that you should report to your doctor or health care professional as soon as possible: allergic reactions like skin rash, itching or hives, swelling of the face, lips, or tongue breathing problems changes in vision fast, irregular heartbeat high or low blood pressure mouth sores pain, tingling, numbness in the hands or feet signs of decreased platelets or bleeding - bruising, pinpoint red spots on the skin, black, tarry stools, blood in the urine signs of decreased red blood cells - unusually weak or tired, feeling faint or lightheaded, falls signs of infection - fever or chills, cough, sore throat, pain or difficulty passing urine signs and symptoms of liver injury like dark yellow or brown urine; general ill feeling or flu-like symptoms; light-colored stools; loss of appetite; nausea; right  upper belly pain; unusually weak or tired; yellowing of the eyes or skin swelling of the ankles, feet, hands unusually slow heartbeat Side effects that usually do not require medical attention (report to your doctor or health care professional if they continue or are bothersome): diarrhea hair loss loss of appetite muscle or joint pain nausea, vomiting pain, redness, or irritation at site where injected tiredness This list may not describe all possible side effects. Call your doctor for medical advice about side effects. You may report side effects to FDA at 1-800-FDA-1088. Where should I keep my medication? This drug is given in a hospital or clinic and will not be stored at home. NOTE: This sheet is a summary. It may not cover all possible information. If you have questions about this medicine, talk to your doctor, pharmacist, or health care provider.  2023 Elsevier/Gold Standard (2020-11-19 00:00:00) 

## 2021-05-27 ENCOUNTER — Telehealth: Payer: Self-pay

## 2021-05-27 ENCOUNTER — Inpatient Hospital Stay (INDEPENDENT_AMBULATORY_CARE_PROVIDER_SITE_OTHER): Payer: Medicare Other | Admitting: Hematology and Oncology

## 2021-05-27 ENCOUNTER — Encounter: Payer: Self-pay | Admitting: Hematology and Oncology

## 2021-05-27 ENCOUNTER — Inpatient Hospital Stay: Payer: Medicare Other

## 2021-05-27 DIAGNOSIS — C50112 Malignant neoplasm of central portion of left female breast: Secondary | ICD-10-CM | POA: Diagnosis not present

## 2021-05-27 DIAGNOSIS — Z170 Estrogen receptor positive status [ER+]: Secondary | ICD-10-CM

## 2021-05-27 DIAGNOSIS — G629 Polyneuropathy, unspecified: Secondary | ICD-10-CM

## 2021-05-27 LAB — BASIC METABOLIC PANEL
BUN: 19 (ref 4–21)
CO2: 24 — AB (ref 13–22)
Chloride: 104 (ref 99–108)
Creatinine: 0.6 (ref 0.5–1.1)
Glucose: 220
Potassium: 4.6 mEq/L (ref 3.5–5.1)
Sodium: 139 (ref 137–147)

## 2021-05-27 LAB — CBC AND DIFFERENTIAL
HCT: 37 (ref 36–46)
Hemoglobin: 12.3 (ref 12.0–16.0)
Neutrophils Absolute: 5.93
Platelets: 298 10*3/uL (ref 150–400)
WBC: 7.7

## 2021-05-27 LAB — HEPATIC FUNCTION PANEL
ALT: 31 U/L (ref 7–35)
AST: 35 (ref 13–35)
Alkaline Phosphatase: 49 (ref 25–125)
Bilirubin, Total: 0.7

## 2021-05-27 LAB — COMPREHENSIVE METABOLIC PANEL
Albumin: 4.3 (ref 3.5–5.0)
Calcium: 9.2 (ref 8.7–10.7)

## 2021-05-27 LAB — CBC: RBC: 3.72 — AB (ref 3.87–5.11)

## 2021-05-27 NOTE — Progress Notes (Signed)
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 Brandy Maxwell has undergone left mastectomy but one node was positive for malignancy. We recommend hormonal therapy. EndoPredict has revealed Brandy Maxwell to be extremely high risk with an EP clin score of 5.4, and so we recommend chemotherapy with AC every 3 weeks followed by weekly Taxol. Brandy Maxwell risk of recurrence in the next 10 years is 55% and Brandy Maxwell has an estimated chemotherapy benefit of 27%. Brandy Maxwell has completed Brandy Maxwell Levaquin and reports feeling much better. We will proceed with cycle 4 next week. Brandy Maxwell has several teeth that are infected and Brandy Maxwell feels this was the cause of Brandy Maxwell fever last week. Brandy Maxwell has started with Brandy Maxwell weekly Taxol and is tolerating well with minimal side effects. Brandy Maxwell will proceed with cycle 6 and return to clinic in one week for repeat evaluation.   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 Brandy Maxwell is at the max dose of gabapentin at this time. Brandy Maxwell states the pain is tolerable at this point.   The patient understands the plans discussed today and is in agreement with them.  Brandy Maxwell knows to contact our office if Brandy Maxwell develops concerns prior to Brandy Maxwell next appointment.    Melodye Ped, NP  [facility] [address] [place] [address] Dept: [phone] Dept  Fax: [phone]   Orders Placed This Encounter  Procedures   CBC and differential    This external order was created through the Results Console.   CBC    This external order was created through the Results Console.   Basic metabolic panel    This external order was created through the Results Console.   Comprehensive metabolic panel    This external order was created through the Results Console.   Hepatic function panel    This external order was created through the Results Console.      CHIEF COMPLAINT:  CC: A 66 year old female with history of breast cancer here for weekly evaluation  Current Treatment:  Weekly paclitaxel  INTERVAL HISTORY:  Brandy Maxwell is here today for repeat clinical assessment. Brandy Maxwell denies fevers or chills. Brandy Maxwell denies pain. Brandy Maxwell appetite is good. Brandy Maxwell weight has been stable.  I have reviewed the past medical history, past surgical history, social history and family history with the patient and they are unchanged from previous note.  ALLERGIES:  is allergic to nitrofurantoin and sulfa antibiotics.  MEDICATIONS:  Current Outpatient Medications  Medication Sig Dispense Refill   DULoxetine (CYMBALTA) 60 MG capsule Take 1 capsule by mouth at bedtime.     dexamethasone (DECADRON) 4 MG tablet Take 3 tabs at the night before and 3 tab the morning of chemotherapy 60 tablet 0   dicyclomine (BENTYL) 10 MG capsule Take 10 mg by mouth 3 (three) times daily as needed for pain. With IBS     EPINEPHrine 0.3 mg/0.3 mL IJ SOAJ injection Inject  0.3 mg into the muscle as needed for anaphylaxis.     estradiol (ESTRACE) 0.1 MG/GM vaginal cream Place 1 Applicatorful vaginally at bedtime. 42.5 g 0   ezetimibe (ZETIA) 10 MG tablet Take 10 mg by mouth daily.     fenofibrate 160 MG tablet Take 160 mg by mouth daily.     gabapentin (NEURONTIN) 600 MG tablet Take 1,200 mg by mouth 3 (three) times daily.     lidocaine (LIDODERM) 5 % Place 1 patch onto the skin as needed for pain.      LINZESS 72 MCG capsule Take 1 capsule (72 mcg total) by mouth daily. 90 capsule 4   magic mouthwash (lidocaine, diphenhydrAMINE, alum & mag hydroxide) suspension Take 5 mLs by mouth every 3 (three) hours as needed. Swish and spit for mouth or throat pain     metoprolol succinate (TOPROL-XL) 25 MG 24 hr tablet Take 1 tablet by mouth daily.     mirabegron ER (MYRBETRIQ) 50 MG TB24 tablet Take 1 tablet by mouth daily.     ondansetron (ZOFRAN) 8 MG tablet Take 1 tablet (8 mg total) by mouth 2 (two) times daily as needed. Start on the third day after chemotherapy. 30 tablet 1   prochlorperazine (COMPAZINE) 10 MG tablet Take 1 tablet (10 mg total) by mouth every 6 (six) hours as needed (Nausea or vomiting). 30 tablet 1   traMADol (ULTRAM) 50 MG tablet Take 50 mg by mouth 2 (two) times daily as needed for pain.     Vitamin D, Ergocalciferol, (DRISDOL) 1.25 MG (50000 UT) CAPS capsule TAKE 1 CAPSULE BY MOUTH ONCE A WEEK FOR 30 DAYS     No current facility-administered medications for this visit.    HISTORY OF PRESENT ILLNESS:   Oncology History  Malignant neoplasm of central portion of left breast in female, estrogen receptor positive ([place])  09/16/2020 Mammogram   DIGITAL SCREENING BILATERAL MAMMOGRAM: In the left breast, a possible mass warrants further evaluation. In the right breast, no findings suspicious for malignancy.    10/08/2020 Mammogram   DIAGNOSTIC UNILATERAL LEFT MAMMOGRAM AND LEFT BREAST ULTRASOUND: There is architectural distortion at the left breast 12 o'clock.  Targeted ultrasound is performed, showing no focal abnormal discrete cystic or solid lesion at left breast 12 o'clock the correlate to the mammographic finding. Ultrasound of the left axilla is negative.   10/20/2020 Pathology Results   Breast, left, needle core biopsy, 12 o'clock:             Invasive mammary carcinoma             Mammary carcinoma in situ HER2: Negative (0) ER: Positive 30% PR: Negative 0% Ki67: 1%    10/26/2020 Initial Diagnosis   Malignant neoplasm of central portion of left breast in female, estrogen receptor positive ([place])    12/17/2020 Cancer Staging   Staging form: Breast, AJCC 8th Edition - Clinical stage from 12/17/2020: Stage IIA (cT1b, cN1(sn), cM0, G2, ER+, PR-, HER2-) - Signed by [name], MD on 02/13/2021 Histopathologic type: Infiltrating duct carcinoma, NOS Stage prefix: Initial diagnosis Method of lymph node assessment: Sentinel lymph node biopsy Nuclear grade: G2 Multigene prognostic tests performed: EndoPredict Histologic grading system: 3 grade system Laterality: Left Tumor size (mm): 7 Lymph-vascular invasion (LVI): LVI not present (absent)/not identified Diagnostic confirmation: Positive histology Specimen type: Excision Staged by: Managing physician Menopausal status: Postmenopausal Ki-67 (%): 1 EndoPredict EPclin risk score: 5.4 EndoPredict EPclin risk level: High risk EndoPredict 10-year percentage risk of distant  recurrence (%): 55 EndoPredict 10-year risk of distant recurrence: High risk Stage used in treatment planning: Yes National guidelines used in treatment planning: Yes Type of national guideline used in treatment planning: NCCN Staging comments: Endopredict with 27% benefit from chemotherapy    12/17/2020 Pathology Results   Breast, simple mastectomy, left:         Invasive ductal carcinoma, grade 2, 7 mm, and ductal carcinoma in situ         Ribbon clip and biopsy reaction present         All margins negative for invasive carcinoma and DCIS         Fibrocystic changes Lymph node, sentinel, biopsy, left axillary:         Tumor present in regional lymph node (1/1)   02/18/2021 -  Chemotherapy   Patient is on Treatment Plan : Breast AC q21 Days / PACLitaxel q7d       Genetic Testing   Negative genetic testing. No pathogenic variants identified on the [facility] Multi-Cancer+RNA panel. The report date is 03/08/2021.  The  Multi-Cancer Panel + RNA offered by [facility] includes sequencing and/or deletion duplication testing of the following 84 genes: AIP, ALK, APC, ATM, AXIN2,BAP1,  BARD1, BLM, BMPR1A, BRCA1, BRCA2, BRIP1, CASR, CDC73, CDH1, CDK4, CDKN1B, CDKN1C, CDKN2A (p14ARF), CDKN2A (p16INK4a), CEBPA, CHEK2, CTNNA1, DICER1, DIS3L2, EGFR (c.2369C>T, p.Thr790Met variant only), EPCAM (Deletion/duplication testing only), FH, FLCN, GATA2, GPC3, GREM1 (Promoter region deletion/duplication testing only), HOXB13 (c.251G>A, p.Gly84Glu), HRAS, KIT, MAX, MEN1, MET, MITF (c.952G>A, p.Glu318Lys variant only), MLH1, MSH2, MSH3, MSH6, MUTYH, NBN, NF1, NF2, NTHL1, PALB2, PDGFRA, PHOX2B, PMS2, POLD1, POLE, POT1, PRKAR1A, PTCH1, PTEN, RAD50, RAD51C, RAD51D, RB1, RECQL4, RET, RUNX1, SDHAF2, SDHA (sequence changes only), SDHB, SDHC, SDHD, SMAD4, SMARCA4, SMARCB1, SMARCE1, STK11, SUFU, TERC, TERT, TMEM127, TP53, TSC1, TSC2, VHL, WRN and WT1.       REVIEW OF SYSTEMS:   Constitutional: Denies fevers, chills or abnormal weight loss Eyes: Denies blurriness of vision Ears, nose, mouth, throat, and face: Denies mucositis or sore throat Respiratory: Denies cough, dyspnea or wheezes Cardiovascular: Denies palpitation, chest discomfort or lower extremity swelling Gastrointestinal:  Denies nausea, heartburn or change in bowel habits Skin: Denies abnormal skin rashes Lymphatics: Denies new lymphadenopathy or easy bruising Neurological:Denies numbness, tingling or new weaknesses Behavioral/Psych: Mood is stable, no new changes  All other systems were reviewed with the patient and are negative.   VITALS:  Blood pressure (!) 127/59, pulse 86, temperature 98.4 F (36.9 C), temperature source Oral, resp. rate 18, height $RemoveBe'5\' 4"'kOymupOVd$  (1.626 m), weight 193 lb 9.6 oz (87.8 kg), SpO2 98 %.  Wt Readings from Last 3 Encounters:  05/27/21 193 lb 9.6 oz (87.8 kg)  05/26/21 195 lb (88.5 kg)  05/23/21 193 lb 12.8 oz (87.9 kg)    Body mass index is 33.23  kg/m.  Performance status (ECOG): 1 - Symptomatic but completely ambulatory  PHYSICAL EXAM:   GENERAL:alert, no distress and comfortable SKIN: skin color, texture, turgor are normal, no rashes or significant lesions EYES: normal, Conjunctiva are pink and non-injected, sclera clear OROPHARYNX:no exudate, no erythema and lips, buccal mucosa, and tongue normal  NECK: supple, thyroid normal size, non-tender, without nodularity LYMPH:  no palpable lymphadenopathy in the cervical, axillary or inguinal LUNGS: clear to auscultation and percussion with normal breathing effort HEART: regular rate & rhythm and no murmurs and no lower extremity edema ABDOMEN:abdomen soft, non-tender and normal bowel sounds Musculoskeletal:no cyanosis of digits and no clubbing  NEURO: alert & oriented x 3  with fluent speech, no focal motor/sensory deficits  LABORATORY DATA:  I have reviewed the data as listed    Component Value Date/Time   NA 139 05/27/2021 0000   K 4.6 05/27/2021 0000   CL 104 05/27/2021 0000   CO2 24 (A) 05/27/2021 0000   BUN 19 05/27/2021 0000   CREATININE 0.6 05/27/2021 0000   CALCIUM 9.2 05/27/2021 0000   ALBUMIN 4.3 05/27/2021 0000   AST 35 05/27/2021 0000   ALT 31 05/27/2021 0000   ALKPHOS 49 05/27/2021 0000    No results found for: SPEP, UPEP  Lab Results  Component Value Date   WBC 7.7 05/27/2021   NEUTROABS 5.93 05/27/2021   HGB 12.3 05/27/2021   HCT 37 05/27/2021   MCV 94 02/03/2021   PLT 298 05/27/2021      Chemistry      Component Value Date/Time   NA 139 05/27/2021 0000   K 4.6 05/27/2021 0000   CL 104 05/27/2021 0000   CO2 24 (A) 05/27/2021 0000   BUN 19 05/27/2021 0000   CREATININE 0.6 05/27/2021 0000   GLU 220 05/27/2021 0000      Component Value Date/Time   CALCIUM 9.2 05/27/2021 0000   ALKPHOS 49 05/27/2021 0000   AST 35 05/27/2021 0000   ALT 31 05/27/2021 0000       RADIOGRAPHIC STUDIES: I have personally reviewed the radiological images as  listed and agreed with the findings in the report. No results found.

## 2021-05-27 NOTE — Assessment & Plan Note (Signed)
She continues to have ongoing neuropathy. She is at the max dose of gabapentin at this time. She states the pain is tolerable at this point.

## 2021-05-27 NOTE — Assessment & Plan Note (Signed)
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 She has undergone left mastectomy but one node was positive for malignancy. We recommend hormonal therapy. EndoPredict has revealed her to be extremely high risk with an EP clin score of 5.4, and so we recommend chemotherapy with AC every 3 weeks followed by weekly Taxol. Her risk of recurrence in the next 10 years is 55% and she has an estimated chemotherapy benefit of 27%. She has completed her Levaquin and reports feeling much better. We will proceed with cycle 4 next week. She has several teeth that are infected and she feels this was the cause of her fever last week. She has started with her weekly Taxol and is tolerating well with minimal side effects. She will proceed with cycle 6 and return to clinic in one week for repeat evaluation.

## 2021-05-27 NOTE — Telephone Encounter (Addendum)
I spoke with pt. She states she feels fine. No side effects noted from Taxol infusion. I reminded pt to call us if she were to develop temp of 100.4 or higher, day or night. She verbalized understanding.

## 2021-05-31 MED FILL — Paclitaxel IV Conc 300 MG/50ML (6 MG/ML): INTRAVENOUS | Qty: 27 | Status: AC

## 2021-06-01 ENCOUNTER — Encounter: Payer: Self-pay | Admitting: Oncology

## 2021-06-01 ENCOUNTER — Inpatient Hospital Stay: Payer: Medicare Other

## 2021-06-01 VITALS — BP 119/71 | HR 87 | Temp 98.3°F | Resp 14 | Wt 198.0 lb

## 2021-06-01 DIAGNOSIS — Z5111 Encounter for antineoplastic chemotherapy: Secondary | ICD-10-CM | POA: Diagnosis not present

## 2021-06-01 DIAGNOSIS — Z170 Estrogen receptor positive status [ER+]: Secondary | ICD-10-CM

## 2021-06-01 MED ORDER — SODIUM CHLORIDE 0.9 % IV SOLN
10.0000 mg | Freq: Once | INTRAVENOUS | Status: AC
  Administered 2021-06-01: 10 mg via INTRAVENOUS
  Filled 2021-06-01: qty 10

## 2021-06-01 MED ORDER — DIPHENHYDRAMINE HCL 50 MG/ML IJ SOLN
50.0000 mg | Freq: Once | INTRAMUSCULAR | Status: AC
  Administered 2021-06-01: 50 mg via INTRAVENOUS
  Filled 2021-06-01: qty 1

## 2021-06-01 MED ORDER — SODIUM CHLORIDE 0.9 % IV SOLN
80.0000 mg/m2 | Freq: Once | INTRAVENOUS | Status: AC
  Administered 2021-06-01: 162 mg via INTRAVENOUS
  Filled 2021-06-01: qty 27

## 2021-06-01 MED ORDER — HEPARIN SOD (PORK) LOCK FLUSH 100 UNIT/ML IV SOLN
500.0000 [IU] | Freq: Once | INTRAVENOUS | Status: AC | PRN
  Administered 2021-06-01: 500 [IU]

## 2021-06-01 MED ORDER — FAMOTIDINE IN NACL 20-0.9 MG/50ML-% IV SOLN
20.0000 mg | Freq: Once | INTRAVENOUS | Status: AC
  Administered 2021-06-01: 20 mg via INTRAVENOUS
  Filled 2021-06-01: qty 50

## 2021-06-01 MED ORDER — SODIUM CHLORIDE 0.9 % IV SOLN: Freq: Once | INTRAVENOUS | Status: AC

## 2021-06-01 MED ORDER — SODIUM CHLORIDE 0.9% FLUSH
10.0000 mL | INTRAVENOUS | Status: DC | PRN
  Administered 2021-06-01: 10 mL

## 2021-06-01 NOTE — Patient Instructions (Signed)
[facility]  Discharge Instructions: Thank you for choosing [facility] to provide your oncology and hematology care.  If you have a lab appointment with the [facility], please go directly to the [facility] and check in at the registration area.   Wear comfortable clothing and clothing appropriate for easy access to any Portacath or PICC line.   We strive to give you quality time with your provider. You may need to reschedule your appointment if you arrive late (15 or more minutes).  Arriving late affects you and other patients whose appointments are after yours.  Also, if you miss three or more appointments without notifying the office, you may be dismissed from the clinic at the provider's discretion.      For prescription refill requests, have your pharmacy contact our office and allow 72 hours for refills to be completed.    Today you received the following chemotherapy and/or immunotherapy agents Taxol Paclitaxel injection What is this medication? PACLITAXEL (PAK li TAX el) is a chemotherapy drug. It targets fast dividing cells, like cancer cells, and causes these cells to die. This medicine is used to treat ovarian cancer, breast cancer, lung cancer, Kaposi's sarcoma, and other cancers. This medicine may be used for other purposes; ask your health care provider or pharmacist if you have questions. COMMON BRAND NAME(S): Onxol, Taxol What should I tell my care team before I take this medication? They need to know if you have any of these conditions: history of irregular heartbeat liver disease low blood counts, like low white cell, platelet, or red cell counts lung or breathing disease, like asthma tingling of the fingers or toes, or other nerve disorder an unusual or allergic reaction to paclitaxel, alcohol, polyoxyethylated castor oil, other chemotherapy, other medicines, foods, dyes, or preservatives pregnant or trying to get  pregnant breast-feeding How should I use this medication? This drug is given as an infusion into a vein. It is administered in a hospital or clinic by a specially trained health care professional. Talk to your pediatrician regarding the use of this medicine in children. Special care may be needed. Overdosage: If you think you have taken too much of this medicine contact a poison control center or emergency room at once. NOTE: This medicine is only for you. Do not share this medicine with others. What if I miss a dose? It is important not to miss your dose. Call your doctor or health care professional if you are unable to keep an appointment. What may interact with this medication? Do not take this medicine with any of the following medications: live virus vaccines This medicine may also interact with the following medications: antiviral medicines for hepatitis, HIV or AIDS certain antibiotics like erythromycin and clarithromycin certain medicines for fungal infections like ketoconazole and itraconazole certain medicines for seizures like carbamazepine, phenobarbital, phenytoin gemfibrozil nefazodone rifampin St. John's wort This list may not describe all possible interactions. Give your health care provider a list of all the medicines, herbs, non-prescription drugs, or dietary supplements you use. Also tell them if you smoke, drink alcohol, or use illegal drugs. Some items may interact with your medicine. What should I watch for while using this medication? Your condition will be monitored carefully while you are receiving this medicine. You will need important blood work done while you are taking this medicine. This medicine can cause serious allergic reactions. To reduce your risk you will need to take other medicine(s) before treatment with  this medicine. If you experience allergic reactions like skin rash, itching or hives, swelling of the face, lips, or tongue, tell your doctor or health  care professional right away. In some cases, you may be given additional medicines to help with side effects. Follow all directions for their use. This drug may make you feel generally unwell. This is not uncommon, as chemotherapy can affect healthy cells as well as cancer cells. Report any side effects. Continue your course of treatment even though you feel ill unless your doctor tells you to stop. Call your doctor or health care professional for advice if you get a fever, chills or sore throat, or other symptoms of a cold or flu. Do not treat yourself. This drug decreases your body's ability to fight infections. Try to avoid being around people who are sick. This medicine may increase your risk to bruise or bleed. Call your doctor or health care professional if you notice any unusual bleeding. Be careful brushing and flossing your teeth or using a toothpick because you may get an infection or bleed more easily. If you have any dental work done, tell your dentist you are receiving this medicine. Avoid taking products that contain aspirin, acetaminophen, ibuprofen, naproxen, or ketoprofen unless instructed by your doctor. These medicines may hide a fever. Do not become pregnant while taking this medicine. Women should inform their doctor if they wish to become pregnant or think they might be pregnant. There is a potential for serious side effects to an unborn child. Talk to your health care professional or pharmacist for more information. Do not breast-feed an infant while taking this medicine. Men are advised not to father a child while receiving this medicine. This product may contain alcohol. Ask your pharmacist or healthcare provider if this medicine contains alcohol. Be sure to tell all healthcare providers you are taking this medicine. Certain medicines, like metronidazole and disulfiram, can cause an unpleasant reaction when taken with alcohol. The reaction includes flushing, headache, nausea,  vomiting, sweating, and increased thirst. The reaction can last from 30 minutes to several hours. What side effects may I notice from receiving this medication? Side effects that you should report to your doctor or health care professional as soon as possible: allergic reactions like skin rash, itching or hives, swelling of the face, lips, or tongue breathing problems changes in vision fast, irregular heartbeat high or low blood pressure mouth sores pain, tingling, numbness in the hands or feet signs of decreased platelets or bleeding - bruising, pinpoint red spots on the skin, black, tarry stools, blood in the urine signs of decreased red blood cells - unusually weak or tired, feeling faint or lightheaded, falls signs of infection - fever or chills, cough, sore throat, pain or difficulty passing urine signs and symptoms of liver injury like dark yellow or brown urine; general ill feeling or flu-like symptoms; light-colored stools; loss of appetite; nausea; right upper belly pain; unusually weak or tired; yellowing of the eyes or skin swelling of the ankles, feet, hands unusually slow heartbeat Side effects that usually do not require medical attention (report to your doctor or health care professional if they continue or are bothersome): diarrhea hair loss loss of appetite muscle or joint pain nausea, vomiting pain, redness, or irritation at site where injected tiredness This list may not describe all possible side effects. Call your doctor for medical advice about side effects. You may report side effects to FDA at 1-800-FDA-1088. Where should I keep my medication? This drug  is given in a hospital or clinic and will not be stored at home. NOTE: This sheet is a summary. It may not cover all possible information. If you have questions about this medicine, talk to your doctor, pharmacist, or health care provider.  2023 Elsevier/Gold Standard (2020-11-19 00:00:00)       To help prevent  nausea and vomiting after your treatment, we encourage you to take your nausea medication as directed.  BELOW ARE SYMPTOMS THAT SHOULD BE REPORTED IMMEDIATELY: *FEVER GREATER THAN 100.4 F (38 C) OR HIGHER *CHILLS OR SWEATING *NAUSEA AND VOMITING THAT IS NOT CONTROLLED WITH YOUR NAUSEA MEDICATION *UNUSUAL SHORTNESS OF BREATH *UNUSUAL BRUISING OR BLEEDING *URINARY PROBLEMS (pain or burning when urinating, or frequent urination) *BOWEL PROBLEMS (unusual diarrhea, constipation, pain near the anus) TENDERNESS IN MOUTH AND THROAT WITH OR WITHOUT PRESENCE OF ULCERS (sore throat, sores in mouth, or a toothache) UNUSUAL RASH, SWELLING OR PAIN  UNUSUAL VAGINAL DISCHARGE OR ITCHING   Items with * indicate a potential emergency and should be followed up as soon as possible or go to the Emergency Department if any problems should occur.  Please show the CHEMOTHERAPY ALERT CARD or IMMUNOTHERAPY ALERT CARD at check-in to the Emergency Department and triage nurse.  Should you have questions after your visit or need to cancel or reschedule your appointment, please contact [facility]  Dept: [phone]  and follow the prompts.  Office hours are 8:00 a.m. to 4:30 p.m. Monday - Friday. Please note that voicemails left after 4:00 p.m. may not be returned until the following business day.  We are closed weekends and major holidays. You have access to a nurse at all times for urgent questions. Please call the main number to the clinic Dept: [phone] and follow the prompts.  For any non-urgent questions, you may also contact your provider using MyChart. We now offer e-Visits for anyone 77 and older to request care online for non-urgent symptoms. For details visit [email or web address].   Also download the MyChart app! Go to the app store, search "MyChart", open the app, select [facility], and log in with your MyChart username and password.  Due to Covid, a mask is required upon  entering the hospital/clinic. If you do not have a mask, one will be given to you upon arrival. For doctor visits, patients may have 1 support person aged 49 or older with them. For treatment visits, patients cannot have anyone with them due to current Covid guidelines and our immunocompromised population.

## 2021-06-03 ENCOUNTER — Encounter: Payer: Self-pay | Admitting: Hematology and Oncology

## 2021-06-03 ENCOUNTER — Inpatient Hospital Stay (INDEPENDENT_AMBULATORY_CARE_PROVIDER_SITE_OTHER): Payer: Medicare Other | Admitting: Hematology and Oncology

## 2021-06-03 ENCOUNTER — Inpatient Hospital Stay: Payer: Medicare Other | Attending: Oncology

## 2021-06-03 VITALS — BP 130/82 | HR 98 | Temp 98.0°F | Resp 20 | Ht 64.0 in | Wt 190.5 lb

## 2021-06-03 DIAGNOSIS — N3000 Acute cystitis without hematuria: Secondary | ICD-10-CM | POA: Diagnosis not present

## 2021-06-03 DIAGNOSIS — C50112 Malignant neoplasm of central portion of left female breast: Secondary | ICD-10-CM

## 2021-06-03 DIAGNOSIS — C773 Secondary and unspecified malignant neoplasm of axilla and upper limb lymph nodes: Secondary | ICD-10-CM | POA: Insufficient documentation

## 2021-06-03 DIAGNOSIS — Z170 Estrogen receptor positive status [ER+]: Secondary | ICD-10-CM | POA: Diagnosis not present

## 2021-06-03 DIAGNOSIS — Z5111 Encounter for antineoplastic chemotherapy: Secondary | ICD-10-CM | POA: Insufficient documentation

## 2021-06-03 DIAGNOSIS — Z9012 Acquired absence of left breast and nipple: Secondary | ICD-10-CM | POA: Insufficient documentation

## 2021-06-03 DIAGNOSIS — Z79899 Other long term (current) drug therapy: Secondary | ICD-10-CM | POA: Insufficient documentation

## 2021-06-03 DIAGNOSIS — B952 Enterococcus as the cause of diseases classified elsewhere: Secondary | ICD-10-CM | POA: Insufficient documentation

## 2021-06-03 DIAGNOSIS — R569 Unspecified convulsions: Secondary | ICD-10-CM | POA: Insufficient documentation

## 2021-06-03 LAB — BASIC METABOLIC PANEL
BUN: 21 (ref 4–21)
CO2: 24 — AB (ref 13–22)
Chloride: 101 (ref 99–108)
Creatinine: 0.7 (ref 0.5–1.1)
Glucose: 164
Potassium: 3.9 mEq/L (ref 3.5–5.1)
Sodium: 137 (ref 137–147)

## 2021-06-03 LAB — CBC AND DIFFERENTIAL
HCT: 36 (ref 36–46)
Hemoglobin: 12.2 (ref 12.0–16.0)
Neutrophils Absolute: 2.52
Platelets: 215 10*3/uL (ref 150–400)
WBC: 4

## 2021-06-03 LAB — HEPATIC FUNCTION PANEL
ALT: 41 U/L — AB (ref 7–35)
AST: 39 — AB (ref 13–35)
Alkaline Phosphatase: 46 (ref 25–125)
Bilirubin, Total: 1

## 2021-06-03 LAB — COMPREHENSIVE METABOLIC PANEL
Albumin: 4.2 (ref 3.5–5.0)
Calcium: 9 (ref 8.7–10.7)

## 2021-06-03 LAB — CBC
MCV: 97 (ref 81–99)
RBC: 3.7 — AB (ref 3.87–5.11)

## 2021-06-03 NOTE — Progress Notes (Signed)
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 She was treated with left mastectomy.  One node was positive for malignancy.  EndoPredict has revealed her to be extremely high risk for recurrence, with an EP clin score of 5.4.  Her risk of recurrence in the next 10 years is 55% and she has an estimated chemotherapy benefit of 27%.  She completed 4 cycles of AC chemotherapy on May 2.  She is now receiving weekly paclitaxel and is here prior to her 7th cycle of adjuvant chemotherapy (3rd cycle of paclitaxel).  She continues to tolerate this well.   She will proceed with paclitaxel on June 7.  We will plan to see her back in 1 week for repeat clinical assessment prior to her 8th cycle.   The patient understands the plans discussed today and is in agreement with them.  She knows to contact our office if she develops concerns prior to her next appointment.   I provided 10 minutes of face-to-face time during this encounter and > 50% was spent counseling as documented under my assessment and plan.    Marvia Pickles, PA-C  [facility] AT [facility] [address] [place] [address] Dept: [phone] Dept Fax: [phone]   Orders Placed This Encounter  Procedures   CBC and differential    This external order was created through the Results Console.   CBC    This external order was created through the Results Console.   CBC    This order was created through External Result Entry   Basic metabolic panel    This external order was created through the Results  Console.   Comprehensive metabolic panel    This external order was created through the Results Console.   Hepatic function panel    This external order was created through the Results Console.      CHIEF COMPLAINT:  CC: Stage IIA hormone receptor positive breast cancer  Current Treatment: Weekly paclitaxel  HISTORY OF PRESENT ILLNESS:   Oncology History  Malignant neoplasm of central portion of left breast in female, estrogen receptor positive ([place])  09/16/2020 Mammogram   DIGITAL SCREENING BILATERAL MAMMOGRAM: In the left breast, a possible mass warrants further evaluation. In the right breast, no findings suspicious for malignancy.    10/08/2020 Mammogram   DIAGNOSTIC UNILATERAL LEFT MAMMOGRAM AND LEFT BREAST ULTRASOUND: There is architectural distortion at the left breast 12 o'clock.  Targeted ultrasound is performed, showing no focal abnormal discrete cystic or solid lesion at left breast 12 o'clock the correlate to the mammographic finding. Ultrasound of the left axilla is negative.   10/20/2020 Pathology Results   Breast, left, needle core biopsy, 12 o'clock:             Invasive mammary carcinoma             Mammary carcinoma in situ HER2: Negative (0) ER: Positive 30% PR: Negative 0% Ki67: 1%   10/26/2020 Initial Diagnosis   Malignant neoplasm of central portion of left breast in female, estrogen receptor positive ([place])    12/17/2020 Cancer  Staging   Staging form: Breast, AJCC 8th Edition - Clinical stage from 12/17/2020: Stage IIA (cT1b, cN1(sn), cM0, G2, ER+, PR-, HER2-) - Signed by [name], MD on 02/13/2021 Histopathologic type: Infiltrating duct carcinoma, NOS Stage prefix: Initial diagnosis Method of lymph node assessment: Sentinel lymph node biopsy Nuclear grade: G2 Multigene prognostic tests performed: EndoPredict Histologic grading system: 3 grade system Laterality: Left Tumor size (mm): 7 Lymph-vascular invasion (LVI): LVI not present  (absent)/not identified Diagnostic confirmation: Positive histology Specimen type: Excision Staged by: Managing physician Menopausal status: Postmenopausal Ki-67 (%): 1 EndoPredict EPclin risk score: 5.4 EndoPredict EPclin risk level: High risk EndoPredict 10-year percentage risk of distant recurrence (%): 55 EndoPredict 10-year risk of distant recurrence: High risk Stage used in treatment planning: Yes National guidelines used in treatment planning: Yes Type of national guideline used in treatment planning: NCCN Staging comments: Endopredict with 27% benefit from chemotherapy    12/17/2020 Pathology Results   Breast, simple mastectomy, left:         Invasive ductal carcinoma, grade 2, 7 mm, and ductal carcinoma in situ         Ribbon clip and biopsy reaction present         All margins negative for invasive carcinoma and DCIS         Fibrocystic changes Lymph node, sentinel, biopsy, left axillary:         Tumor present in regional lymph node (1/1)   02/18/2021 -  Chemotherapy   Patient is on Treatment Plan : Breast AC q21 Days / PACLitaxel q7d       Genetic Testing   Negative genetic testing. No pathogenic variants identified on the [facility] Multi-Cancer+RNA panel. The report date is 03/08/2021.  The Multi-Cancer Panel + RNA offered by [facility] includes sequencing and/or deletion duplication testing of the following 84 genes: AIP, ALK, APC, ATM, AXIN2,BAP1,  BARD1, BLM, BMPR1A, BRCA1, BRCA2, BRIP1, CASR, CDC73, CDH1, CDK4, CDKN1B, CDKN1C, CDKN2A (p14ARF), CDKN2A (p16INK4a), CEBPA, CHEK2, CTNNA1, DICER1, DIS3L2, EGFR (c.2369C>T, p.Thr790Met variant only), EPCAM (Deletion/duplication testing only), FH, FLCN, GATA2, GPC3, GREM1 (Promoter region deletion/duplication testing only), HOXB13 (c.251G>A, p.Gly84Glu), HRAS, KIT, MAX, MEN1, MET, MITF (c.952G>A, p.Glu318Lys variant only), MLH1, MSH2, MSH3, MSH6, MUTYH, NBN, NF1, NF2, NTHL1, PALB2, PDGFRA, PHOX2B, PMS2, POLD1, POLE, POT1, PRKAR1A,  PTCH1, PTEN, RAD50, RAD51C, RAD51D, RB1, RECQL4, RET, RUNX1, SDHAF2, SDHA (sequence changes only), SDHB, SDHC, SDHD, SMAD4, SMARCA4, SMARCB1, SMARCE1, STK11, SUFU, TERC, TERT, TMEM127, TP53, TSC1, TSC2, VHL, WRN and WT1.       INTERVAL HISTORY:  Anaisha is here today for repeat clinical assessment prior to her third cycle of weekly paclitaxel.  She continues to tolerate this without significant difficulty.  She reports fatigue, but has chronic fatigue syndrome.  She reports shortness of breath, which is chronic and stable.  She has occasional dry cough.  She denies chest pain.  She reports occasional dizziness, which is also chronic and stable.  She reports stable neuropathy.  She is concerned her urinary tract infection may not have cleared, but denies specific symptoms.  I will repeat a urinalysis and urine culture today.  She denies fevers or chills. She denies pain. Her appetite is good. Her weight has decreased 3 pounds over last 1 week .  She mentions she has been shopping for mastectomy bras and prosthesis online.  I advised her this should be covered by insurance and gave her information regarding [facility] [employer] in [place], as they will fit her for prosthesis and bras, which  she needs due to her left mastectomy and partial right mastectomy.  REVIEW OF SYSTEMS:  Review of Systems  Constitutional:  Positive for fatigue. Negative for appetite change, chills, fever and unexpected weight change.  HENT:   Negative for lump/mass, mouth sores and sore throat.   Respiratory:  Positive for cough (Occasional, dry) and shortness of breath (Chronic, stable).   Cardiovascular:  Negative for chest pain and leg swelling.  Gastrointestinal:  Negative for abdominal pain, constipation, diarrhea, nausea and vomiting.  Endocrine: Negative for hot flashes.  Genitourinary:  Negative for difficulty urinating, dysuria, frequency and hematuria.   Musculoskeletal:  Negative for arthralgias, back pain and myalgias.   Skin:  Negative for rash.  Neurological:  Positive for dizziness (Intermittent, chronic) and numbness (Bilateral lower extremities). Negative for headaches.  Hematological:  Negative for adenopathy. Does not bruise/bleed easily.  Psychiatric/Behavioral:  Negative for depression and sleep disturbance. The patient is not nervous/anxious.     VITALS:  Blood pressure 130/82, pulse 98, temperature 98 F (36.7 C), temperature source Oral, resp. rate 20, height _0  (1.626 m), weight 190 lb 8 oz (86.4 kg), SpO2 93 %.  Wt Readings from Last 3 Encounters:  06/03/21 190 lb 8 oz (86.4 kg)  06/01/21 198 lb (89.8 kg)  05/27/21 193 lb 9.6 oz (87.8 kg)    Body mass index is 32.7 kg/m.  Performance status (ECOG): 1 - Symptomatic but completely ambulatory  PHYSICAL EXAM:  Physical Exam Vitals and nursing note reviewed.  Constitutional:      General: She is not in acute distress.    Appearance: Normal appearance.  HENT:     Head: Normocephalic and atraumatic.     Mouth/Throat:     Mouth: Mucous membranes are moist.     Pharynx: Oropharynx is clear. No oropharyngeal exudate or posterior oropharyngeal erythema.  Eyes:     General: No scleral icterus.    Extraocular Movements: Extraocular movements intact.     Conjunctiva/sclera: Conjunctivae normal.     Pupils: Pupils are equal, round, and reactive to light.  Cardiovascular:     Rate and Rhythm: Normal rate and regular rhythm.     Heart sounds: Normal heart sounds. No murmur heard.   No friction rub. No gallop.  Pulmonary:     Effort: Pulmonary effort is normal.     Breath sounds: Normal breath sounds. No wheezing, rhonchi or rales.  Chest:  Breasts:    Right: Normal.     Left: Absent.     Comments: Left mastectomy site is negative. Abdominal:     General: There is no distension.     Palpations: Abdomen is soft. There is no hepatomegaly, splenomegaly or mass.     Tenderness: There is no abdominal tenderness.  Musculoskeletal:         General: Normal range of motion.     Cervical back: Normal range of motion and neck supple. No tenderness.     Right lower leg: No edema.     Left lower leg: No edema.  Lymphadenopathy:     Cervical: No cervical adenopathy.     Upper Body:     Right upper body: No supraclavicular or axillary adenopathy.     Left upper body: No supraclavicular or axillary adenopathy.     Lower Body: No right inguinal adenopathy. No left inguinal adenopathy.  Skin:    General: Skin is warm and dry.     Coloration: Skin is not jaundiced.     Findings: No rash.  Neurological:     Mental Status: She is alert and oriented to person, place, and time.     Cranial Nerves: No cranial nerve deficit.  Psychiatric:        Mood and Affect: Mood normal.        Behavior: Behavior normal.        Thought Content: Thought content normal.    LABS:      Latest Ref Rng & Units 06/03/2021   12:00 AM 05/27/2021   12:00 AM 05/23/2021   12:00 AM  CBC  WBC  4.0      7.7      6.2       Hemoglobin 12.0 - 16.0 12.2      12.3      13.4       Hematocrit 36 - 46 36      37      39       Platelets 150 - 400 K/uL 215      298      401          This result is from an external source.      Latest Ref Rng & Units 06/03/2021   12:00 AM 05/27/2021   12:00 AM 05/23/2021   12:00 AM  CMP  BUN 4 - _0 Creatinine 0.5 - 1.1 0.7      0.6      0.7       Sodium 137 - 147 137      139      138       Potassium 3.5 - 5.1 mEq/L 3.9      4.6      4.2       Chloride 99 - 108 101      104      100       CO2 13 - _1 Calcium 8.7 - 10.7 9.0      9.2      10.3       Alkaline Phos 25 - 125 46      49      56       AST 13 - 35 39      35      37       ALT 7 - 35 U/L 41      31      33          This result is from an external source.     No results found for: CEA1 / No results found for: CEA1 No results found for: PSA1 No results found for: NGE952 No results found for: CAN125  No results  found for: TOTALPROTELP, ALBUMINELP, A1GS, A2GS, BETS, BETA2SER, GAMS, MSPIKE, SPEI No results found for: TIBC, FERRITIN, IRONPCTSAT No results found for: LDH  STUDIES:  No results found.    HISTORY:   Past Medical History:  Diagnosis Date   Abnormal mammogram of left breast 11/05/2018   Allergic rhinitis 08/30/2013   Altered consciousness 09/29/2020   Arthritis    Atypical ductal hyperplasia of right breast 05/21/2017   Cardiac murmur 09/23/2019   Chronic kidney disease    recurrent kidney infections   Coccydynia 03/24/2015   Degeneration of thoracic or thoracolumbar  intervertebral disc 02/12/2013   Depression with anxiety 08/30/2013   Diet-controlled diabetes mellitus ([place]) 10/31/2019   Difficulty in walking 08/30/2013   Disorder of sacrum 02/12/2013   Dyspnea on exertion 10/29/2020   Epilepsy ([place]) 08/30/2013   Excessive daytime sleepiness 07/03/2017   Family history of breast cancer    Family history of colon cancer    Family history of first degree relative with dementia 10/26/2017   Family history of lung cancer    Family history of prostate cancer    Family history of uterine cancer    Fibromyalgia    Gait abnormality 07/10/2012   Genetic testing 03/09/2021   Negative genetic testing. No pathogenic variants identified on the [facility] Multi-Cancer+RNA panel. The report date is 03/08/2021.  The Multi-Cancer Panel + RNA offered by [facility] includes sequencing and/or deletion duplication testing of the following 84 genes: AIP, ALK, APC, ATM, AXIN2,BAP1,  BARD1, BLM, BMPR1A, BRCA1, BRCA2, BRIP1, CASR, CDC73, CDH1, CDK4, CDKN1B, CDKN1C, CDKN2A (p14ARF), CDKN2A (   Herpes zoster with nervous system complications 85/02/7739   History of seizure disorder 03/17/2019   Hypercholesteremia    Hyperlipidemia 08/30/2013   IBS (irritable bowel syndrome)    Insomnia 09/07/2015   Malignant neoplasm of central portion of left breast in female, estrogen receptor positive ([place]) 10/26/2020    Mass of upper outer quadrant of left breast 05/21/2017   Medication management 01/31/2019   Migraine headache 08/30/2013   Mild cognitive impairment 03/17/2019   Myalgia and myositis 02/12/2013   Neurogenic bladder 05/21/2020   Neuropathic pain 07/22/2020   Neuropathy 03/17/2019   Obesity, Class I, BMI 30-34.9 03/17/2019   Obstructive sleep apnea (adult) (pediatric) 08/30/2013   Pain in thoracic spine 02/12/2013   Palpitations 09/23/2019   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   Postlaminectomy syndrome, lumbar region 02/12/2013   Postoperative examination 06/19/2017   Postural hypotension 09/23/2019   Restless legs syndrome 05/22/2017   Risk for falls 09/07/2015   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   Seizures ([place])    Sleep apnea    nightly cpap   Spondylosis of thoracic region without myelopathy or radiculopathy 02/12/2013   Thoracic or lumbosacral neuritis or radiculitis 02/12/2013   Thoracic spondylosis with myelopathy 02/12/2013   Tremors of nervous system    Urinary incontinence 03/17/2019   Urinary incontinence, mixed 05/29/2013   Vitamin B 12 deficiency 03/17/2019   Vitamin D deficiency 03/17/2019   Weakness 07/10/2012    Past Surgical History:  Procedure Laterality Date   BLADDER REPAIR  03/13/2018   BLADDER SUSPENSION  03/13/2018   BREAST LUMPECTOMY  2019   COLONOSCOPY  12/20/2012   Left colonic diverticulosis predominantly in the sigmoid colon. Small internal hemmorhoids. Otherwise normal colonoscopy.    LAMINECTOMY  1998    Family History  Problem Relation Age of Onset   Hypercholesterolemia Mother    Alzheimer's disease Mother    Heart Problems Father    Clotting disorder Father    Alzheimer's disease Father    Prostate cancer Father        dx 22s   Allergic rhinitis Sister    Uterine cancer Maternal Aunt        dx 24s, d. 31   Breast cancer Maternal Aunt 60       recurrence, metastatic, d. 55    Hemophilia Maternal Aunt    Breast cancer Maternal Aunt        dx 4-s d. 22s   Lung cancer Maternal Uncle 38  smoker   Lung cancer Maternal Uncle        60s   Breast cancer Paternal Aunt 59       dx 65s d. 75, 3 recurrences   Liver cancer Paternal Uncle    Kidney cancer Paternal Uncle    Colon cancer Maternal Grandfather        45s   Brain cancer Paternal Grandfather        59s   Colon cancer Cousin    Breast cancer Cousin        dx 92s, bilateral mastectomy   Esophageal cancer Neg Hx    Stomach cancer Neg Hx    Rectal cancer Neg Hx     Social History:  reports that she has never smoked. She has never used smokeless tobacco. She reports that she does not drink alcohol and does not use drugs.The patient is accompanied by her daughter today.  Allergies:  Allergies  Allergen Reactions   Nitrofurantoin Anaphylaxis   Sulfa Antibiotics Rash, Other (See Comments) and Hives    Other reaction(s): Rash     Current Medications: Current Outpatient Medications  Medication Sig Dispense Refill   dexamethasone (DECADRON) 4 MG tablet Take 3 tabs at the night before and 3 tab the morning of chemotherapy 60 tablet 0   dicyclomine (BENTYL) 10 MG capsule Take 10 mg by mouth 3 (three) times daily as needed for pain. With IBS     DULoxetine (CYMBALTA) 60 MG capsule Take 1 capsule by mouth at bedtime.     EPINEPHrine 0.3 mg/0.3 mL IJ SOAJ injection Inject 0.3 mg into the muscle as needed for anaphylaxis.     estradiol (ESTRACE) 0.1 MG/GM vaginal cream Place 1 Applicatorful vaginally at bedtime. 42.5 g 0   ezetimibe (ZETIA) 10 MG tablet Take 10 mg by mouth daily.     fenofibrate 160 MG tablet Take 160 mg by mouth daily.     gabapentin (NEURONTIN) 600 MG tablet Take 1,200 mg by mouth 3 (three) times daily.     lidocaine (LIDODERM) 5 % Place 1 patch onto the skin as needed for pain.     LINZESS 72 MCG capsule Take 1 capsule (72 mcg total) by mouth daily. 90 capsule 4   magic mouthwash  (lidocaine, diphenhydrAMINE, alum & mag hydroxide) suspension Take 5 mLs by mouth every 3 (three) hours as needed. Swish and spit for mouth or throat pain     metoprolol succinate (TOPROL-XL) 25 MG 24 hr tablet Take 1 tablet by mouth daily.     mirabegron ER (MYRBETRIQ) 50 MG TB24 tablet Take 1 tablet by mouth daily.     ondansetron (ZOFRAN) 8 MG tablet Take 1 tablet (8 mg total) by mouth 2 (two) times daily as needed. Start on the third day after chemotherapy. 30 tablet 1   prochlorperazine (COMPAZINE) 10 MG tablet Take 1 tablet (10 mg total) by mouth every 6 (six) hours as needed (Nausea or vomiting). 30 tablet 1   traMADol (ULTRAM) 50 MG tablet Take 50 mg by mouth 2 (two) times daily as needed for pain.     Vitamin D, Ergocalciferol, (DRISDOL) 1.25 MG (50000 UT) CAPS capsule TAKE 1 CAPSULE BY MOUTH ONCE A WEEK FOR 30 DAYS     No current facility-administered medications for this visit.

## 2021-06-03 NOTE — Assessment & Plan Note (Addendum)
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 She was treated with left mastectomy.  One node was positive for malignancy.  EndoPredict has revealed her to be extremely high risk for recurrence, with an EP clin score of 5.4.  Her risk of recurrence in the next 10 years is 55% and she has an estimated chemotherapy benefit of 27%.  She completed 4 cycles of AC chemotherapy on May 2.  She is now receiving weekly paclitaxel and is here prior to her 7th cycle of adjuvant chemotherapy (3rd cycle of paclitaxel).  She continues to tolerate this well.   She will proceed with paclitaxel on June 7.  We will plan to see her back in 1 week for repeat clinical assessment prior to her 8th cycle.

## 2021-06-04 ENCOUNTER — Encounter: Payer: Self-pay | Admitting: Oncology

## 2021-06-07 ENCOUNTER — Ambulatory Visit: Payer: Self-pay

## 2021-06-07 MED FILL — Dexamethasone Sodium Phosphate Inj 100 MG/10ML: INTRAMUSCULAR | Qty: 1 | Status: AC

## 2021-06-07 MED FILL — Paclitaxel IV Conc 300 MG/50ML (6 MG/ML): INTRAVENOUS | Qty: 27 | Status: AC

## 2021-06-08 ENCOUNTER — Inpatient Hospital Stay: Payer: Medicare Other

## 2021-06-08 VITALS — BP 108/72 | HR 101 | Temp 98.6°F | Resp 18 | Ht 64.0 in | Wt 190.0 lb

## 2021-06-08 DIAGNOSIS — Z9012 Acquired absence of left breast and nipple: Secondary | ICD-10-CM | POA: Diagnosis not present

## 2021-06-08 DIAGNOSIS — Z170 Estrogen receptor positive status [ER+]: Secondary | ICD-10-CM | POA: Diagnosis not present

## 2021-06-08 DIAGNOSIS — Z79899 Other long term (current) drug therapy: Secondary | ICD-10-CM | POA: Diagnosis not present

## 2021-06-08 DIAGNOSIS — B952 Enterococcus as the cause of diseases classified elsewhere: Secondary | ICD-10-CM | POA: Diagnosis not present

## 2021-06-08 DIAGNOSIS — C50112 Malignant neoplasm of central portion of left female breast: Secondary | ICD-10-CM | POA: Diagnosis present

## 2021-06-08 DIAGNOSIS — Z5111 Encounter for antineoplastic chemotherapy: Secondary | ICD-10-CM | POA: Diagnosis present

## 2021-06-08 DIAGNOSIS — C773 Secondary and unspecified malignant neoplasm of axilla and upper limb lymph nodes: Secondary | ICD-10-CM | POA: Diagnosis not present

## 2021-06-08 DIAGNOSIS — R569 Unspecified convulsions: Secondary | ICD-10-CM | POA: Diagnosis not present

## 2021-06-08 MED ORDER — DIPHENHYDRAMINE HCL 50 MG/ML IJ SOLN
25.0000 mg | Freq: Once | INTRAMUSCULAR | Status: AC
  Administered 2021-06-08: 25 mg via INTRAVENOUS
  Filled 2021-06-08: qty 1

## 2021-06-08 MED ORDER — FAMOTIDINE IN NACL 20-0.9 MG/50ML-% IV SOLN
20.0000 mg | Freq: Once | INTRAVENOUS | Status: AC
  Administered 2021-06-08: 20 mg via INTRAVENOUS
  Filled 2021-06-08: qty 50

## 2021-06-08 MED ORDER — SODIUM CHLORIDE 0.9 % IV SOLN
80.0000 mg/m2 | Freq: Once | INTRAVENOUS | Status: AC
  Administered 2021-06-08: 162 mg via INTRAVENOUS
  Filled 2021-06-08: qty 27

## 2021-06-08 MED ORDER — HEPARIN SOD (PORK) LOCK FLUSH 100 UNIT/ML IV SOLN
500.0000 [IU] | Freq: Once | INTRAVENOUS | Status: AC | PRN
  Administered 2021-06-08: 500 [IU]

## 2021-06-08 MED ORDER — SODIUM CHLORIDE 0.9 % IV SOLN: Freq: Once | INTRAVENOUS | Status: AC

## 2021-06-08 MED ORDER — SODIUM CHLORIDE 0.9% FLUSH
10.0000 mL | INTRAVENOUS | Status: DC | PRN
  Administered 2021-06-08: 10 mL

## 2021-06-08 MED ORDER — SODIUM CHLORIDE 0.9 % IV SOLN
10.0000 mg | Freq: Once | INTRAVENOUS | Status: AC
  Administered 2021-06-08: 10 mg via INTRAVENOUS
  Filled 2021-06-08: qty 10

## 2021-06-08 NOTE — Patient Instructions (Signed)
Paclitaxel injection What is this medication? PACLITAXEL (PAK li TAX el) is a chemotherapy drug. It targets fast dividing cells, like cancer cells, and causes these cells to die. This medicine is used to treat ovarian cancer, breast cancer, lung cancer, Kaposi's sarcoma, and other cancers. This medicine may be used for other purposes; ask your health care provider or pharmacist if you have questions. COMMON BRAND NAME(S): Onxol, Taxol What should I tell my care team before I take this medication? They need to know if you have any of these conditions: history of irregular heartbeat liver disease low blood counts, like low white cell, platelet, or red cell counts lung or breathing disease, like asthma tingling of the fingers or toes, or other nerve disorder an unusual or allergic reaction to paclitaxel, alcohol, polyoxyethylated castor oil, other chemotherapy, other medicines, foods, dyes, or preservatives pregnant or trying to get pregnant breast-feeding How should I use this medication? This drug is given as an infusion into a vein. It is administered in a hospital or clinic by a specially trained health care professional. Talk to your pediatrician regarding the use of this medicine in children. Special care may be needed. Overdosage: If you think you have taken too much of this medicine contact a poison control center or emergency room at once. NOTE: This medicine is only for you. Do not share this medicine with others. What if I miss a dose? It is important not to miss your dose. Call your doctor or health care professional if you are unable to keep an appointment. What may interact with this medication? Do not take this medicine with any of the following medications: live virus vaccines This medicine may also interact with the following medications: antiviral medicines for hepatitis, HIV or AIDS certain antibiotics like erythromycin and clarithromycin certain medicines for fungal  infections like ketoconazole and itraconazole certain medicines for seizures like carbamazepine, phenobarbital, phenytoin gemfibrozil nefazodone rifampin [place]'s wort This list may not describe all possible interactions. Give your health care provider a list of all the medicines, herbs, non-prescription drugs, or dietary supplements you use. Also tell them if you smoke, drink alcohol, or use illegal drugs. Some items may interact with your medicine. What should I watch for while using this medication? Your condition will be monitored carefully while you are receiving this medicine. You will need important blood work done while you are taking this medicine. This medicine can cause serious allergic reactions. To reduce your risk you will need to take other medicine(s) before treatment with this medicine. If you experience allergic reactions like skin rash, itching or hives, swelling of the face, lips, or tongue, tell your doctor or health care professional right away. In some cases, you may be given additional medicines to help with side effects. Follow all directions for their use. This drug may make you feel generally unwell. This is not uncommon, as chemotherapy can affect healthy cells as well as cancer cells. Report any side effects. Continue your course of treatment even though you feel ill unless your doctor tells you to stop. Call your doctor or health care professional for advice if you get a fever, chills or sore throat, or other symptoms of a cold or flu. Do not treat yourself. This drug decreases your body's ability to fight infections. Try to avoid being around people who are sick. This medicine may increase your risk to bruise or bleed. Call your doctor or health care professional if you notice any unusual bleeding. Be careful brushing  and flossing your teeth or using a toothpick because you may get an infection or bleed more easily. If you have any dental work done, tell your dentist  you are receiving this medicine. Avoid taking products that contain aspirin, acetaminophen, ibuprofen, naproxen, or ketoprofen unless instructed by your doctor. These medicines may hide a fever. Do not become pregnant while taking this medicine. Women should inform their doctor if they wish to become pregnant or think they might be pregnant. There is a potential for serious side effects to an unborn child. Talk to your health care professional or pharmacist for more information. Do not breast-feed an infant while taking this medicine. Men are advised not to father a child while receiving this medicine. This product may contain alcohol. Ask your pharmacist or healthcare provider if this medicine contains alcohol. Be sure to tell all healthcare providers you are taking this medicine. Certain medicines, like metronidazole and disulfiram, can cause an unpleasant reaction when taken with alcohol. The reaction includes flushing, headache, nausea, vomiting, sweating, and increased thirst. The reaction can last from 30 minutes to several hours. What side effects may I notice from receiving this medication? Side effects that you should report to your doctor or health care professional as soon as possible: allergic reactions like skin rash, itching or hives, swelling of the face, lips, or tongue breathing problems changes in vision fast, irregular heartbeat high or low blood pressure mouth sores pain, tingling, numbness in the hands or feet signs of decreased platelets or bleeding - bruising, pinpoint red spots on the skin, black, tarry stools, blood in the urine signs of decreased red blood cells - unusually weak or tired, feeling faint or lightheaded, falls signs of infection - fever or chills, cough, sore throat, pain or difficulty passing urine signs and symptoms of liver injury like dark yellow or brown urine; general ill feeling or flu-like symptoms; light-colored stools; loss of appetite; nausea; right  upper belly pain; unusually weak or tired; yellowing of the eyes or skin swelling of the ankles, feet, hands unusually slow heartbeat Side effects that usually do not require medical attention (report to your doctor or health care professional if they continue or are bothersome): diarrhea hair loss loss of appetite muscle or joint pain nausea, vomiting pain, redness, or irritation at site where injected tiredness This list may not describe all possible side effects. Call your doctor for medical advice about side effects. You may report side effects to FDA at 1-800-FDA-1088. Where should I keep my medication? This drug is given in a hospital or clinic and will not be stored at home. NOTE: This sheet is a summary. It may not cover all possible information. If you have questions about this medicine, talk to your doctor, pharmacist, or health care provider.  2023 Elsevier/Gold Standard (2020-11-19 00:00:00) 

## 2021-06-09 ENCOUNTER — Encounter: Payer: Self-pay | Admitting: Hematology and Oncology

## 2021-06-10 ENCOUNTER — Inpatient Hospital Stay: Payer: Medicare Other

## 2021-06-10 ENCOUNTER — Inpatient Hospital Stay (INDEPENDENT_AMBULATORY_CARE_PROVIDER_SITE_OTHER): Payer: Medicare Other | Admitting: Hematology and Oncology

## 2021-06-10 VITALS — BP 71/32 | HR 127 | Temp 98.0°F | Resp 18 | Ht 64.0 in | Wt 190.8 lb

## 2021-06-10 DIAGNOSIS — C50112 Malignant neoplasm of central portion of left female breast: Secondary | ICD-10-CM | POA: Diagnosis not present

## 2021-06-10 DIAGNOSIS — Z170 Estrogen receptor positive status [ER+]: Secondary | ICD-10-CM

## 2021-06-10 DIAGNOSIS — Z5111 Encounter for antineoplastic chemotherapy: Secondary | ICD-10-CM | POA: Diagnosis not present

## 2021-06-10 LAB — HEPATIC FUNCTION PANEL
ALT: 42 U/L — AB (ref 7–35)
AST: 41 — AB (ref 13–35)
Alkaline Phosphatase: 52 (ref 25–125)
Bilirubin, Total: 1.1

## 2021-06-10 LAB — CBC AND DIFFERENTIAL
HCT: 35 — AB (ref 36–46)
Hemoglobin: 11.9 — AB (ref 12.0–16.0)
Neutrophils Absolute: 2.45
Platelets: 266 10*3/uL (ref 150–400)
WBC: 3.5

## 2021-06-10 LAB — CBC: RBC: 3.57 — AB (ref 3.87–5.11)

## 2021-06-10 LAB — BASIC METABOLIC PANEL
BUN: 24 — AB (ref 4–21)
CO2: 23 — AB (ref 13–22)
Chloride: 100 (ref 99–108)
Creatinine: 0.8 (ref 0.5–1.1)
Glucose: 240
Potassium: 3.6 mEq/L (ref 3.5–5.1)
Sodium: 137 (ref 137–147)

## 2021-06-10 LAB — COMPREHENSIVE METABOLIC PANEL
Albumin: 4.3 (ref 3.5–5.0)
Calcium: 9.1 (ref 8.7–10.7)

## 2021-06-10 MED ORDER — LEVOFLOXACIN IN D5W 500 MG/100ML IV SOLN
500.0000 mg | INTRAVENOUS | Status: DC
  Administered 2021-06-10: 500 mg via INTRAVENOUS
  Filled 2021-06-10: qty 100

## 2021-06-10 MED ORDER — AMPICILLIN 500 MG PO CAPS: 500.0000 mg | ORAL_CAPSULE | Freq: Four times a day (QID) | ORAL | 0 refills | Status: DC

## 2021-06-10 MED ORDER — SODIUM CHLORIDE 0.9% FLUSH
10.0000 mL | Freq: Once | INTRAVENOUS | Status: AC | PRN
  Administered 2021-06-10: 10 mL

## 2021-06-10 MED ORDER — SODIUM CHLORIDE 0.9 % IV SOLN: Freq: Once | INTRAVENOUS | Status: AC

## 2021-06-10 MED ORDER — HEPARIN SOD (PORK) LOCK FLUSH 100 UNIT/ML IV SOLN
500.0000 [IU] | Freq: Once | INTRAVENOUS | Status: AC | PRN
  Administered 2021-06-10: 500 [IU]

## 2021-06-10 NOTE — Progress Notes (Signed)
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 She was treated with left mastectomy.  One node was positive for malignancy.  EndoPredict has revealed her to be extremely high risk for recurrence, with an EP clin score of 5.4.  Her risk of recurrence in the next 10 years is 55% and she has an estimated chemotherapy benefit of 27%.  She completed 4 cycles of AC chemotherapy on May 2.  She is now receiving weekly paclitaxel and is here prior to her 8th cycle of adjuvant chemotherapy (4th cycle of paclitaxel). She continues to tolerate this well. She presents to clinic feeling light headed and states she used her miralax last night and had several episodes of diarrhea. She is also noted to have a UTI with enterococcus faecalis which is highly sensitive to ampicillin. As it is also sensitive to levaquin, we will plan to give her IVF today for hydration and an IV dose of levaquin. She will start po ampicillin tomorrow. She will proceed with treatment 07/14 and return to clinic next week for repeat evaluation prior to cycle 9.     The patient understands the plans discussed today and is in agreement with them.  She knows to contact our office if she develops concerns prior to her next appointment.    Melodye Ped, NP  [facility] [address] [place] [address] Dept: [phone] Dept Fax: [phone]   Orders Placed This Encounter  Procedures   CBC and differential    This external order was created through the Results Console.   CBC    This external order was created through the Results Console.   Basic metabolic panel    This external order was created through the Results Console.   Comprehensive metabolic panel    This external order was created through the Results Console.   Hepatic function panel    This external order was created through the Results Console.      CHIEF COMPLAINT:  CC: A 66 year old female with history of breast cancer here for weekly evaluation  Current Treatment:  Paclitaxel  INTERVAL HISTORY:  Brandy Maxwell is here today for repeat clinical assessment. She denies fevers or chills. She denies pain. Her appetite is good. Her weight has been stable.  I have reviewed the past medical history, past surgical history, social history and family history with the patient and they are unchanged from previous note.  ALLERGIES:  is allergic to nitrofurantoin and sulfa antibiotics.  MEDICATIONS:  Current Outpatient Medications  Medication Sig Dispense Refill   ampicillin (PRINCIPEN) 500 MG capsule Take 1 capsule (500 mg total) by mouth 4 (four) times daily. 28 capsule 0   dexamethasone (DECADRON) 4 MG tablet Take 3 tabs at the night before and 3 tab the morning of chemotherapy 60 tablet 0   dicyclomine (BENTYL) 10 MG capsule Take 10 mg by mouth  3 (three) times daily as needed for pain. With IBS     DULoxetine (CYMBALTA) 60 MG capsule Take 1 capsule by mouth at bedtime.     EPINEPHrine 0.3 mg/0.3 mL IJ SOAJ injection Inject 0.3 mg into the muscle as needed for anaphylaxis.     estradiol (ESTRACE) 0.1 MG/GM vaginal cream Place 1 Applicatorful vaginally at bedtime. 42.5 g 0   ezetimibe (ZETIA) 10 MG tablet Take 10 mg by mouth daily.     fenofibrate 160 MG tablet Take 160  mg by mouth daily.     gabapentin (NEURONTIN) 600 MG tablet Take 1,200 mg by mouth 3 (three) times daily.     lidocaine (LIDODERM) 5 % Place 1 patch onto the skin as needed for pain.     LINZESS 72 MCG capsule Take 1 capsule (72 mcg total) by mouth daily. 90 capsule 4   magic mouthwash (lidocaine, diphenhydrAMINE, alum & mag hydroxide) suspension Take 5 mLs by mouth every 3 (three) hours as needed. Swish and spit for mouth or throat pain     metoprolol succinate (TOPROL-XL) 25 MG 24 hr tablet Take 1 tablet by mouth daily.     mirabegron ER (MYRBETRIQ) 50 MG TB24 tablet Take 1 tablet by mouth daily.     ondansetron (ZOFRAN) 8 MG tablet Take 1 tablet (8 mg total) by mouth 2 (two) times daily as needed. Start on the third day after chemotherapy. 30 tablet 1   prochlorperazine (COMPAZINE) 10 MG tablet Take 1 tablet (10 mg total) by mouth every 6 (six) hours as needed (Nausea or vomiting). 30 tablet 1   traMADol (ULTRAM) 50 MG tablet Take 50 mg by mouth 2 (two) times daily as needed for pain.     Vitamin D, Ergocalciferol, (DRISDOL) 1.25 MG (50000 UT) CAPS capsule TAKE 1 CAPSULE BY MOUTH ONCE A WEEK FOR 30 DAYS     Current Facility-Administered Medications  Medication Dose Route Frequency Provider Last Rate Last Admin   levofloxacin (LEVAQUIN) IVPB 500 mg  500 mg Intravenous Q24H [place] Scrape A, NP        HISTORY OF PRESENT ILLNESS:   Oncology History  Malignant neoplasm of central portion of left breast in female, estrogen receptor positive ([place])  09/16/2020 Mammogram   DIGITAL SCREENING BILATERAL MAMMOGRAM: In the left breast, a possible mass warrants further evaluation. In the right breast, no findings suspicious for malignancy.    10/08/2020 Mammogram   DIAGNOSTIC UNILATERAL LEFT MAMMOGRAM AND LEFT BREAST ULTRASOUND: There is architectural distortion at the left breast 12 o'clock.  Targeted ultrasound is performed, showing no focal abnormal discrete cystic or solid lesion at left breast  12 o'clock the correlate to the mammographic finding. Ultrasound of the left axilla is negative.   10/20/2020 Pathology Results   Breast, left, needle core biopsy, 12 o'clock:             Invasive mammary carcinoma             Mammary carcinoma in situ HER2: Negative (0) ER: Positive 30% PR: Negative 0% Ki67: 1%   10/26/2020 Initial Diagnosis   Malignant neoplasm of central portion of left breast in female, estrogen receptor positive ([place])   12/17/2020 Cancer Staging   Staging form: Breast, AJCC 8th Edition - Clinical stage from 12/17/2020: Stage IIA (cT1b, cN1(sn), cM0, G2, ER+, PR-, HER2-) - Signed by [name], MD on 02/13/2021 Histopathologic type: Infiltrating duct carcinoma, NOS Stage prefix: Initial diagnosis Method of lymph node assessment: Sentinel lymph node  biopsy Nuclear grade: G2 Multigene prognostic tests performed: EndoPredict Histologic grading system: 3 grade system Laterality: Left Tumor size (mm): 7 Lymph-vascular invasion (LVI): LVI not present (absent)/not identified Diagnostic confirmation: Positive histology Specimen type: Excision Staged by: Managing physician Menopausal status: Postmenopausal Ki-67 (%): 1 EndoPredict EPclin risk score: 5.4 EndoPredict EPclin risk level: High risk EndoPredict 10-year percentage risk of distant recurrence (%): 55 EndoPredict 10-year risk of distant recurrence: High risk Stage used in treatment planning: Yes National guidelines used in treatment planning: Yes Type of national guideline used in treatment planning: NCCN Staging comments: Endopredict with 27% benefit from chemotherapy   12/17/2020 Pathology Results   Breast, simple mastectomy, left:         Invasive ductal carcinoma, grade 2, 7 mm, and ductal carcinoma in situ         Ribbon clip and biopsy reaction present         All margins negative for invasive carcinoma and DCIS         Fibrocystic changes Lymph node, sentinel, biopsy, left axillary:          Tumor present in regional lymph node (1/1)   02/18/2021 -  Chemotherapy   Patient is on Treatment Plan : Breast AC q21 Days / PACLitaxel q7d      Genetic Testing   Negative genetic testing. No pathogenic variants identified on the [facility] Multi-Cancer+RNA panel. The report date is 03/08/2021.  The Multi-Cancer Panel + RNA offered by [facility] includes sequencing and/or deletion duplication testing of the following 84 genes: AIP, ALK, APC, ATM, AXIN2,BAP1,  BARD1, BLM, BMPR1A, BRCA1, BRCA2, BRIP1, CASR, CDC73, CDH1, CDK4, CDKN1B, CDKN1C, CDKN2A (p14ARF), CDKN2A (p16INK4a), CEBPA, CHEK2, CTNNA1, DICER1, DIS3L2, EGFR (c.2369C>T, p.Thr790Met variant only), EPCAM (Deletion/duplication testing only), FH, FLCN, GATA2, GPC3, GREM1 (Promoter region deletion/duplication testing only), HOXB13 (c.251G>A, p.Gly84Glu), HRAS, KIT, MAX, MEN1, MET, MITF (c.952G>A, p.Glu318Lys variant only), MLH1, MSH2, MSH3, MSH6, MUTYH, NBN, NF1, NF2, NTHL1, PALB2, PDGFRA, PHOX2B, PMS2, POLD1, POLE, POT1, PRKAR1A, PTCH1, PTEN, RAD50, RAD51C, RAD51D, RB1, RECQL4, RET, RUNX1, SDHAF2, SDHA (sequence changes only), SDHB, SDHC, SDHD, SMAD4, SMARCA4, SMARCB1, SMARCE1, STK11, SUFU, TERC, TERT, TMEM127, TP53, TSC1, TSC2, VHL, WRN and WT1.       REVIEW OF SYSTEMS:   Constitutional: Denies fevers, chills or abnormal weight loss Eyes: Denies blurriness of vision Ears, nose, mouth, throat, and face: Denies mucositis or sore throat Respiratory: Denies cough, dyspnea or wheezes Cardiovascular: Denies palpitation, chest discomfort or lower extremity swelling Gastrointestinal:  Denies nausea, heartburn or change in bowel habits Skin: Denies abnormal skin rashes Lymphatics: Denies new lymphadenopathy or easy bruising Neurological:Denies numbness, tingling or new weaknesses Behavioral/Psych: Mood is stable, no new changes  All other systems were reviewed with the patient and are negative.   VITALS:  Pulse (!) 127, temperature 98 F (36.7  C), temperature source Oral, resp. rate 18, height $RemoveBe'5\' 4"'ghFEHPnKK$  (1.626 m), weight 190 lb 12.8 oz (86.5 kg), SpO2 97 %.  Wt Readings from Last 3 Encounters:  06/10/21 190 lb 12.8 oz (86.5 kg)  06/08/21 190 lb (86.2 kg)  06/03/21 190 lb 8 oz (86.4 kg)    Body mass index is 32.75 kg/m.  Performance status (ECOG): 1 - Symptomatic but completely ambulatory  PHYSICAL EXAM:   GENERAL:alert, no distress and comfortable SKIN: skin color, texture, turgor are normal, no rashes or significant lesions EYES: normal, Conjunctiva are pink and non-injected, sclera clear OROPHARYNX:no exudate, no erythema and lips, buccal mucosa, and tongue normal  NECK: supple, thyroid normal size,  non-tender, without nodularity LYMPH:  no palpable lymphadenopathy in the cervical, axillary or inguinal LUNGS: clear to auscultation and percussion with normal breathing effort HEART: regular rate & rhythm and no murmurs and no lower extremity edema ABDOMEN:abdomen soft, non-tender and normal bowel sounds Musculoskeletal:no cyanosis of digits and no clubbing  NEURO: alert & oriented x 3 with fluent speech, no focal motor/sensory deficits  LABORATORY DATA:  I have reviewed the data as listed    Component Value Date/Time   NA 137 06/10/2021 0000   K 3.6 06/10/2021 0000   CL 100 06/10/2021 0000   CO2 23 (A) 06/10/2021 0000   BUN 24 (A) 06/10/2021 0000   CREATININE 0.8 06/10/2021 0000   CALCIUM 9.1 06/10/2021 0000   ALBUMIN 4.3 06/10/2021 0000   AST 41 (A) 06/10/2021 0000   ALT 42 (A) 06/10/2021 0000   ALKPHOS 52 06/10/2021 0000    No results found for: "SPEP", "UPEP"  Lab Results  Component Value Date   WBC 3.5 06/10/2021   NEUTROABS 2.45 06/10/2021   HGB 11.9 (A) 06/10/2021   HCT 35 (A) 06/10/2021   MCV 97 06/03/2021   PLT 266 06/10/2021      Chemistry      Component Value Date/Time   NA 137 06/10/2021 0000   K 3.6 06/10/2021 0000   CL 100 06/10/2021 0000   CO2 23 (A) 06/10/2021 0000   BUN 24 (A)  06/10/2021 0000   CREATININE 0.8 06/10/2021 0000   GLU 240 06/10/2021 0000      Component Value Date/Time   CALCIUM 9.1 06/10/2021 0000   ALKPHOS 52 06/10/2021 0000   AST 41 (A) 06/10/2021 0000   ALT 42 (A) 06/10/2021 0000       RADIOGRAPHIC STUDIES: I have personally reviewed the radiological images as listed and agreed with the findings in the report. No results found.

## 2021-06-10 NOTE — Assessment & Plan Note (Signed)
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 She was treated with left mastectomy.  One node was positive for malignancy.  EndoPredict has revealed her to be extremely high risk for recurrence, with an EP clin score of 5.4.  Her risk of recurrence in the next 10 years is 55% and she has an estimated chemotherapy benefit of 27%.  She completed 4 cycles of AC chemotherapy on May 2.  She is now receiving weekly paclitaxel and is here prior to her 8th cycle of adjuvant chemotherapy (4th cycle of paclitaxel). She continues to tolerate this well.She presents to clinic feeling light headed and states she used her miralax last night and had several episodes of diarrhea. She is also noted to have a UTI with enterococcus faecalis which is highly sensitive to ampicillin. As it is also sensitive to levaquin, we will plan to give her IVF today for hydration and an IV dose of levaquin. She will start po ampicillin tomorrow. She will proceed with treatment 07/14 and return to clinic next week for repeat evaluation prior to cycle 9.

## 2021-06-13 ENCOUNTER — Encounter: Payer: Self-pay | Admitting: Oncology

## 2021-06-13 ENCOUNTER — Encounter: Payer: Self-pay | Admitting: Hematology and Oncology

## 2021-06-14 ENCOUNTER — Ambulatory Visit: Payer: Self-pay

## 2021-06-14 MED FILL — Paclitaxel IV Conc 300 MG/50ML (6 MG/ML): INTRAVENOUS | Qty: 27 | Status: AC

## 2021-06-14 MED FILL — Dexamethasone Sodium Phosphate Inj 100 MG/10ML: INTRAMUSCULAR | Qty: 1 | Status: AC

## 2021-06-15 ENCOUNTER — Inpatient Hospital Stay: Payer: Medicare Other

## 2021-06-15 ENCOUNTER — Other Ambulatory Visit: Payer: Self-pay | Admitting: Pharmacist

## 2021-06-15 VITALS — BP 122/78 | HR 123 | Temp 98.2°F | Resp 18

## 2021-06-15 DIAGNOSIS — C50112 Malignant neoplasm of central portion of left female breast: Secondary | ICD-10-CM

## 2021-06-15 DIAGNOSIS — Z5111 Encounter for antineoplastic chemotherapy: Secondary | ICD-10-CM | POA: Diagnosis not present

## 2021-06-15 MED ORDER — SODIUM CHLORIDE 0.9 % IV SOLN: Freq: Once | INTRAVENOUS | Status: AC

## 2021-06-15 MED ORDER — SODIUM CHLORIDE 0.9 % IV SOLN
80.0000 mg/m2 | Freq: Once | INTRAVENOUS | Status: DC
  Filled 2021-06-15: qty 27

## 2021-06-15 MED ORDER — FAMOTIDINE IN NACL 20-0.9 MG/50ML-% IV SOLN
20.0000 mg | Freq: Once | INTRAVENOUS | Status: DC
  Filled 2021-06-15: qty 50

## 2021-06-15 MED ORDER — SODIUM CHLORIDE 0.9 % IV SOLN
10.0000 mg | Freq: Once | INTRAVENOUS | Status: AC
  Administered 2021-06-15: 10 mg via INTRAVENOUS
  Filled 2021-06-15: qty 10

## 2021-06-15 MED ORDER — DIPHENHYDRAMINE HCL 50 MG/ML IJ SOLN
50.0000 mg | Freq: Once | INTRAMUSCULAR | Status: AC
  Administered 2021-06-15: 50 mg via INTRAVENOUS
  Filled 2021-06-15: qty 1

## 2021-06-15 NOTE — Progress Notes (Signed)
Patient here today for her taxol treatment. Patient was getting her premedications for her chemo when she started to seize. Seizure started at 1012 and ended around 1020. Vital signs stable. Ambulance was called and patient was transported to [facility] for evaluation.

## 2021-06-16 ENCOUNTER — Encounter: Payer: Self-pay | Admitting: Cardiology

## 2021-06-17 ENCOUNTER — Ambulatory Visit: Payer: Medicare Other | Admitting: Oncology

## 2021-06-17 ENCOUNTER — Other Ambulatory Visit: Payer: Medicare Other

## 2021-06-21 ENCOUNTER — Ambulatory Visit: Payer: Self-pay

## 2021-06-22 ENCOUNTER — Ambulatory Visit: Payer: Self-pay

## 2021-06-22 ENCOUNTER — Encounter: Payer: Self-pay | Admitting: Hematology and Oncology

## 2021-06-23 ENCOUNTER — Telehealth: Payer: Self-pay

## 2021-06-23 NOTE — Telephone Encounter (Signed)
-----   Message from Melodye Ped, NP sent at 06/22/2021  3:07 PM EDT ----- Regarding: RE: discharge Let's get her in as soon as Dr. Hinton Rao can see her. ----- Message ----- From: Georgette Shell, RN Sent: 06/22/2021   1:07 PM EDT To: Melodye Ped, NP Subject: discharge                                      She is out of hospital and wants to know about follow up?

## 2021-06-23 NOTE — Telephone Encounter (Signed)
Forward message to scheduling.

## 2021-06-26 NOTE — Progress Notes (Signed)
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 She was treated with left mastectomy.  One node was positive for malignancy.  EndoPredict has revealed her to be extremely high risk for recurrence, with an EP clin score of 5.4.  Her risk of recurrence in the next 10 years is 55% and she has an estimated chemotherapy benefit of 27%.  She completed 4 cycles of AC chemotherapy on May 2.  She is now receiving weekly paclitaxel and has completed 8 cycles of adjuvant chemotherapy (4 cycles of paclitaxel). She continues to tolerate this well.    Aseptic meningitis/encephalitis She was hospitalized with a seizure and tests have been inconclusive, including MRI, EEG and lumbar puncture.  She did have elevated white blood cells in the CSF but all cultures were negative and testing for HSV and VZV were negative. She had been transferred to [facility] and will follow up with their neurologist. She was discharged on Keppra 500 mg BID.   We will give her a little more time to recover from the recent events. We feel it is unlikely that it was related to her chemotherapy and she would like to resume treatment.  I agree in view of her high risk for recurrence. I will see her back in 2 weeks with CBC and CMP to see if she is ready to start back on her weekly paclitaxel. The patient and her family understand the plans discussed today and are in agreement with them.  She  knows to contact our office if she develops concerns prior to her next appointment.    [name], MD  [facility] [address] [place] [address] Dept: [phone] Dept Fax: [phone]   No orders of the defined types were placed in this encounter.     CHIEF COMPLAINT:  CC: A 66 year old female with history of breast cancer here for weekly evaluation  Current Treatment:  Paclitaxel  INTERVAL HISTORY:  Kathern is here today for repeat clinical assessment. She was at the infusion center for her 4th weekly Taxol when she experienced a seizure. She had not even received the chemotherapy, was still on the premeds. She was taken to [facility] and evaluated, then transferred to [facility], where further evaluation was inconclusive despite MRI brain, EEG and lumbar puncture.  There were white cells seen but cultures were negative. She was negative for VZV and HSV. She was weak and dehydrated and given IV fluids, steroids and benadryl. She was discharged on Keppra 500 mg BID and diagnosed as aseptic meningitis/encephalitis. She does describe tightness of her right neck and couldn't move. She had swelling of the right face and increased tinnitus. Her appetite has been poor but is slowly improving. She has lost 5 1/2 pounds. She is on 3 Ensure daily and Pedialyte. She has lower back pain that she rates as a 4/10 and is waiting for a prescription of hydrocodone. CBC is normal today. The  CMP has mildly abnormal liver transaminases, slightly worse than previous. She denies fevers or chills.  I have reviewed the past medical history, past surgical history, social history and family history with the patient and they are unchanged from previous note.  ALLERGIES:  is allergic to nitrofurantoin and sulfa antibiotics.  MEDICATIONS:  Current Outpatient Medications  Medication Sig Dispense Refill   fluconazole (DIFLUCAN)  150 MG tablet Take by mouth in the morning and at bedtime.     dexamethasone (DECADRON) 4 MG tablet Take 3 tabs at the night before and 3 tab the morning of chemotherapy 60 tablet 0   dicyclomine (BENTYL) 10 MG capsule Take 10 mg by mouth 3 (three) times daily as needed for pain. With IBS     DULoxetine (CYMBALTA) 60 MG capsule Take 1 capsule by mouth at bedtime.     EPINEPHrine 0.3 mg/0.3 mL IJ SOAJ injection Inject 0.3 mg into the muscle as needed for anaphylaxis.     estradiol (ESTRACE) 0.1 MG/GM vaginal cream Place 1 Applicatorful vaginally at bedtime. 42.5 g 0   ezetimibe (ZETIA) 10 MG tablet Take 10 mg by mouth daily.     fenofibrate 160 MG tablet Take 160 mg by mouth daily.     gabapentin (NEURONTIN) 600 MG tablet Take 1,200 mg by mouth 3 (three) times daily.     HYDROcodone-acetaminophen (NORCO/VICODIN) 5-325 MG tablet Take by mouth.     levETIRAcetam (KEPPRA) 500 MG tablet Take 500 mg by mouth 2 (two) times daily.     lidocaine (LIDODERM) 5 % Place 1 patch onto the skin as needed for pain.     lidocaine (XYLOCAINE) 2 % solution SMARTSIG:By Mouth     LINZESS 72 MCG capsule Take 1 capsule (72 mcg total) by mouth daily. 90 capsule 4   magic mouthwash (lidocaine, diphenhydrAMINE, alum & mag hydroxide) suspension Take 5 mLs by mouth every 3 (three) hours as needed. Swish and spit for mouth or throat pain     midodrine (PROAMATINE) 5 MG tablet Take 5 mg by mouth 3 (three) times daily with meals.     mirabegron ER (MYRBETRIQ) 50 MG TB24 tablet Take 1 tablet by mouth daily.     omeprazole (PRILOSEC OTC) 20 MG tablet Take 1 tablet by mouth daily.     ondansetron (ZOFRAN) 8 MG tablet Take 1 tablet (8 mg total) by mouth 2 (two) times daily as needed. Start on the third day after chemotherapy. 30 tablet 1   pramipexole (MIRAPEX) 1 MG tablet Take 1 mg by mouth at bedtime.     prochlorperazine (COMPAZINE) 10 MG tablet Take 1 tablet (10 mg total) by mouth every 6 (six) hours as needed (Nausea or  vomiting). 30 tablet 1   ramelteon (ROZEREM) 8 MG tablet Take 8 mg by mouth at bedtime.     Vitamin D, Ergocalciferol, (DRISDOL) 1.25 MG (50000 UT) CAPS capsule TAKE 1 CAPSULE BY MOUTH ONCE A WEEK FOR 30 DAYS     No current facility-administered medications for this visit.    HISTORY OF PRESENT ILLNESS:   Oncology History  Malignant neoplasm of central portion of left breast in female, estrogen receptor positive ([place])  09/16/2020 Mammogram   DIGITAL SCREENING BILATERAL MAMMOGRAM: In the left breast, a possible mass warrants further evaluation. In the right breast, no findings suspicious for malignancy.    10/08/2020 Mammogram   DIAGNOSTIC UNILATERAL LEFT MAMMOGRAM AND LEFT BREAST ULTRASOUND: There is architectural distortion at the left breast 12 o'clock.  Targeted ultrasound is  performed, showing no focal abnormal discrete cystic or solid lesion at left breast 12 o'clock the correlate to the mammographic finding. Ultrasound of the left axilla is negative.   10/20/2020 Pathology Results   Breast, left, needle core biopsy, 12 o'clock:             Invasive mammary carcinoma             Mammary carcinoma in situ HER2: Negative (0) ER: Positive 30% PR: Negative 0% Ki67: 1%   10/26/2020 Initial Diagnosis   Malignant neoplasm of central portion of left breast in female, estrogen receptor positive ([place])   12/17/2020 Cancer Staging   Staging form: Breast, AJCC 8th Edition - Clinical stage from 12/17/2020: Stage IIA (cT1b, cN1(sn), cM0, G2, ER+, PR-, HER2-) - Signed by [name], MD on 02/13/2021 Histopathologic type: Infiltrating duct carcinoma, NOS Stage prefix: Initial diagnosis Method of lymph node assessment: Sentinel lymph node biopsy Nuclear grade: G2 Multigene prognostic tests performed: EndoPredict Histologic grading system: 3 grade system Laterality: Left Tumor size (mm): 7 Lymph-vascular invasion (LVI): LVI not present (absent)/not identified Diagnostic  confirmation: Positive histology Specimen type: Excision Staged by: Managing physician Menopausal status: Postmenopausal Ki-67 (%): 1 EndoPredict EPclin risk score: 5.4 EndoPredict EPclin risk level: High risk EndoPredict 10-year percentage risk of distant recurrence (%): 55 EndoPredict 10-year risk of distant recurrence: High risk Stage used in treatment planning: Yes National guidelines used in treatment planning: Yes Type of national guideline used in treatment planning: NCCN Staging comments: Endopredict with 27% benefit from chemotherapy   12/17/2020 Pathology Results   Breast, simple mastectomy, left:         Invasive ductal carcinoma, grade 2, 7 mm, and ductal carcinoma in situ         Ribbon clip and biopsy reaction present         All margins negative for invasive carcinoma and DCIS         Fibrocystic changes Lymph node, sentinel, biopsy, left axillary:         Tumor present in regional lymph node (1/1)   02/18/2021 -  Chemotherapy   Patient is on Treatment Plan : Breast AC q21 Days / PACLitaxel q7d      Genetic Testing   Negative genetic testing. No pathogenic variants identified on the [facility] Multi-Cancer+RNA panel. The report date is 03/08/2021.  The Multi-Cancer Panel + RNA offered by [facility] includes sequencing and/or deletion duplication testing of the following 84 genes: AIP, ALK, APC, ATM, AXIN2,BAP1,  BARD1, BLM, BMPR1A, BRCA1, BRCA2, BRIP1, CASR, CDC73, CDH1, CDK4, CDKN1B, CDKN1C, CDKN2A (p14ARF), CDKN2A (p16INK4a), CEBPA, CHEK2, CTNNA1, DICER1, DIS3L2, EGFR (c.2369C>T, p.Thr790Met variant only), EPCAM (Deletion/duplication testing only), FH, FLCN, GATA2, GPC3, GREM1 (Promoter region deletion/duplication testing only), HOXB13 (c.251G>A, p.Gly84Glu), HRAS, KIT, MAX, MEN1, MET, MITF (c.952G>A, p.Glu318Lys variant only), MLH1, MSH2, MSH3, MSH6, MUTYH, NBN, NF1, NF2, NTHL1, PALB2, PDGFRA, PHOX2B, PMS2, POLD1, POLE, POT1, PRKAR1A, PTCH1, PTEN, RAD50, RAD51C, RAD51D, RB1,  RECQL4, RET, RUNX1, SDHAF2, SDHA (sequence changes only), SDHB, SDHC, SDHD, SMAD4, SMARCA4, SMARCB1, SMARCE1, STK11, SUFU, TERC, TERT, TMEM127, TP53, TSC1, TSC2, VHL, WRN and WT1.       REVIEW OF SYSTEMS:   Constitutional: Denies fevers, chills or abnormal weight loss Eyes: Denies blurriness of vision Ears, nose, mouth, throat, and face: Denies mucositis or sore throat Respiratory: Denies cough, dyspnea or wheezes Cardiovascular: Denies palpitation, chest discomfort or lower extremity swelling Gastrointestinal:  Denies nausea, heartburn or change in bowel habits Skin: Denies abnormal skin rashes Lymphatics: Denies  new lymphadenopathy or easy bruising Neurological:Denies numbness, tingling or new weaknesses Behavioral/Psych: Mood is stable, no new changes  All other systems were reviewed with the patient and are negative.   VITALS:  Blood pressure (!) 111/59, pulse (!) 127, temperature 98.5 F (36.9 C), temperature source Oral, resp. rate 18, height $RemoveBe'5\' 4"'iUnDMpuoC$  (1.626 m), weight 184 lb 8 oz (83.7 kg), SpO2 97 %.  Wt Readings from Last 3 Encounters:  07/19/21 188 lb 1.9 oz (85.3 kg)  07/11/21 187 lb 9.6 oz (85.1 kg)  06/27/21 184 lb 8 oz (83.7 kg)    Body mass index is 31.67 kg/m.  Performance status (ECOG): 1 - Symptomatic but completely ambulatory  PHYSICAL EXAM:   GENERAL:alert, no distress and comfortable SKIN: skin color, texture, turgor are normal, no rashes or significant lesions EYES: normal, Conjunctiva are pink and non-injected, sclera clear OROPHARYNX:no exudate, no erythema and lips, buccal mucosa, and tongue normal  NECK: supple, thyroid normal size, non-tender, without nodularity LYMPH:  no palpable lymphadenopathy in the cervical, axillary or inguinal LUNGS: clear to auscultation and percussion with normal breathing effort HEART: regular rate & rhythm and no murmurs and no lower extremity edema ABDOMEN:abdomen soft, non-tender and normal bowel  sounds Musculoskeletal:no cyanosis of digits and no clubbing  NEURO: alert & oriented x 3 with fluent speech, no focal motor/sensory deficits  LABORATORY DATA:  I have reviewed the data as listed    Component Value Date/Time   NA 135 (A) 07/11/2021 0000   K 4.1 07/11/2021 0000   CL 101 07/11/2021 0000   CO2 24 (A) 07/11/2021 0000   BUN 11 07/11/2021 0000   CREATININE 0.6 07/11/2021 0000   CALCIUM 10.2 07/11/2021 0000   ALBUMIN 4.2 07/11/2021 0000   AST 47 (A) 07/11/2021 0000   ALT 39 (A) 07/11/2021 0000   ALKPHOS 54 07/11/2021 0000    No results found for: "SPEP", "UPEP"  Lab Results  Component Value Date   WBC 6.5 07/11/2021   NEUTROABS 4.75 07/11/2021   HGB 13.0 07/11/2021   HCT 38 07/11/2021   MCV 97 06/03/2021   PLT 281 07/11/2021      Chemistry      Component Value Date/Time   NA 135 (A) 07/11/2021 0000   K 4.1 07/11/2021 0000   CL 101 07/11/2021 0000   CO2 24 (A) 07/11/2021 0000   BUN 11 07/11/2021 0000   CREATININE 0.6 07/11/2021 0000   GLU 257 07/11/2021 0000      Component Value Date/Time   CALCIUM 10.2 07/11/2021 0000   ALKPHOS 54 07/11/2021 0000   AST 47 (A) 07/11/2021 0000   ALT 39 (A) 07/11/2021 0000       RADIOGRAPHIC STUDIES: No results found.

## 2021-06-27 ENCOUNTER — Other Ambulatory Visit: Payer: Self-pay | Admitting: Hematology and Oncology

## 2021-06-27 ENCOUNTER — Inpatient Hospital Stay (INDEPENDENT_AMBULATORY_CARE_PROVIDER_SITE_OTHER): Payer: Medicare Other | Admitting: Oncology

## 2021-06-27 ENCOUNTER — Inpatient Hospital Stay: Payer: Medicare Other

## 2021-06-27 ENCOUNTER — Encounter: Payer: Self-pay | Admitting: Oncology

## 2021-06-27 VITALS — BP 111/59 | HR 127 | Temp 98.5°F | Resp 18 | Ht 64.0 in | Wt 184.5 lb

## 2021-06-27 DIAGNOSIS — C50112 Malignant neoplasm of central portion of left female breast: Secondary | ICD-10-CM

## 2021-06-27 DIAGNOSIS — Z170 Estrogen receptor positive status [ER+]: Secondary | ICD-10-CM | POA: Diagnosis not present

## 2021-06-27 DIAGNOSIS — C773 Secondary and unspecified malignant neoplasm of axilla and upper limb lymph nodes: Secondary | ICD-10-CM | POA: Diagnosis not present

## 2021-06-27 LAB — BASIC METABOLIC PANEL
BUN: 11 (ref 4–21)
CO2: 26 — AB (ref 13–22)
Chloride: 100 (ref 99–108)
Creatinine: 0.6 (ref 0.5–1.1)
Glucose: 153
Potassium: 4.3 mEq/L (ref 3.5–5.1)
Sodium: 134 — AB (ref 137–147)

## 2021-06-27 LAB — HEPATIC FUNCTION PANEL
ALT: 48 U/L — AB (ref 7–35)
AST: 71 — AB (ref 13–35)
Alkaline Phosphatase: 37 (ref 25–125)
Bilirubin, Total: 0.7

## 2021-06-27 LAB — COMPREHENSIVE METABOLIC PANEL
Albumin: 4.2 (ref 3.5–5.0)
Calcium: 9.7 (ref 8.7–10.7)

## 2021-06-27 LAB — CBC: RBC: 3.64 — AB (ref 3.87–5.11)

## 2021-06-27 LAB — CBC AND DIFFERENTIAL
HCT: 37 (ref 36–46)
Hemoglobin: 12.7 (ref 12.0–16.0)
Neutrophils Absolute: 3.08
Platelets: 274 10*3/uL (ref 150–400)
WBC: 5.6

## 2021-06-28 ENCOUNTER — Telehealth: Payer: Self-pay | Admitting: Dietician

## 2021-06-28 ENCOUNTER — Encounter: Payer: Self-pay | Admitting: Oncology

## 2021-07-05 ENCOUNTER — Other Ambulatory Visit: Payer: Self-pay | Admitting: Oncology

## 2021-07-07 ENCOUNTER — Other Ambulatory Visit: Payer: Self-pay | Admitting: Cardiology

## 2021-07-07 ENCOUNTER — Other Ambulatory Visit: Payer: Self-pay

## 2021-07-11 ENCOUNTER — Inpatient Hospital Stay: Payer: Medicare Other

## 2021-07-11 ENCOUNTER — Inpatient Hospital Stay: Payer: Medicare Other | Attending: Oncology | Admitting: Hematology and Oncology

## 2021-07-11 ENCOUNTER — Encounter: Payer: Self-pay | Admitting: Hematology and Oncology

## 2021-07-11 ENCOUNTER — Encounter: Payer: Self-pay | Admitting: Oncology

## 2021-07-11 DIAGNOSIS — C50112 Malignant neoplasm of central portion of left female breast: Secondary | ICD-10-CM

## 2021-07-11 DIAGNOSIS — Z79899 Other long term (current) drug therapy: Secondary | ICD-10-CM | POA: Insufficient documentation

## 2021-07-11 DIAGNOSIS — Z5111 Encounter for antineoplastic chemotherapy: Secondary | ICD-10-CM | POA: Insufficient documentation

## 2021-07-11 DIAGNOSIS — Z170 Estrogen receptor positive status [ER+]: Secondary | ICD-10-CM | POA: Diagnosis not present

## 2021-07-11 LAB — BASIC METABOLIC PANEL
BUN: 11 (ref 4–21)
CO2: 24 — AB (ref 13–22)
Chloride: 101 (ref 99–108)
Creatinine: 0.6 (ref 0.5–1.1)
Glucose: 257
Potassium: 4.1 mEq/L (ref 3.5–5.1)
Sodium: 135 — AB (ref 137–147)

## 2021-07-11 LAB — CBC AND DIFFERENTIAL
HCT: 38 (ref 36–46)
Hemoglobin: 13 (ref 12.0–16.0)
Neutrophils Absolute: 4.75
Platelets: 281 10*3/uL (ref 150–400)
WBC: 6.5

## 2021-07-11 LAB — COMPREHENSIVE METABOLIC PANEL
Albumin: 4.2 (ref 3.5–5.0)
Calcium: 10.2 (ref 8.7–10.7)

## 2021-07-11 LAB — HEPATIC FUNCTION PANEL
ALT: 39 U/L — AB (ref 7–35)
AST: 47 — AB (ref 13–35)
Alkaline Phosphatase: 54 (ref 25–125)
Bilirubin, Total: 0.6

## 2021-07-11 LAB — CBC: RBC: 3.81 — AB (ref 3.87–5.11)

## 2021-07-11 NOTE — Progress Notes (Signed)
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 She was treated with left mastectomy.  One node was positive for malignancy.  EndoPredict has revealed her to be extremely high risk for recurrence, with an EP clin score of 5.4.  Her risk of recurrence in the next 10 years is 55% and she has an estimated chemotherapy benefit of 27%.  She completed 4 cycles of AC chemotherapy on May 2.  She is now receiving weekly paclitaxel. During her most recent treatment, cycle 9, she began to have seizure activity during her pre-meds and she was transported to the hospital via EMS. She was hospitalized, later transferred and treated for meningitis. She has been discharged and presents to clinic today to re-start therapy. She remains tired and states she doesn't remember everything from her admission. She denies any residual symptoms. She is anxious to get back on schedule for treatment. We will plan on giving her this week to rest up and proceed with cycle 9 on 07/18. She will return to clinic one week later for repeat evaluation.    The patient understands the plans discussed today and is in agreement with them.  She knows to contact our office if she develops concerns prior to her next appointment.   Melodye Ped, NP  [facility] [address] [place] [address] Dept: [phone] Dept Fax: [phone]   Orders Placed This Encounter  Procedures   CBC and differential    This external order was created through the Results Console.   CBC    This external order was created through the Results Console.   Basic metabolic panel    This external order was created through the Results Console.   Comprehensive metabolic panel    This external order was created through the Results Console.   Hepatic function panel    This external order was created through the Results Console.      CHIEF COMPLAINT:  CC: A 66 year old female with history of breast cancer here for pre-treatment evaluation.  Current Treatment:  Paclitaxel  INTERVAL HISTORY:  Brandy Maxwell is here today for repeat clinical assessment. She denies fevers or chills. She denies pain. Her appetite is good. Her weight has been stable.  I have reviewed the past medical history, past surgical history, social history and family history with the patient and they are unchanged from previous note.  ALLERGIES:  is allergic to nitrofurantoin and sulfa antibiotics.  MEDICATIONS:  Current Outpatient Medications  Medication Sig Dispense Refill   midodrine (PROAMATINE) 5 MG tablet Take 5 mg by mouth 3 (three) times daily with meals.     dexamethasone (DECADRON) 4 MG tablet Take 3 tabs at the night before and 3 tab the morning of chemotherapy 60 tablet 0   dicyclomine (BENTYL) 10 MG capsule Take 10 mg by mouth 3 (three) times daily as needed for pain. With  IBS     DULoxetine (CYMBALTA) 60 MG capsule Take 1 capsule by mouth at bedtime.     EPINEPHrine 0.3 mg/0.3 mL IJ SOAJ injection Inject 0.3 mg into the muscle as needed for anaphylaxis.     estradiol (ESTRACE) 0.1 MG/GM vaginal cream Place 1 Applicatorful vaginally at bedtime. 42.5 g 0   ezetimibe (ZETIA) 10 MG tablet Take 10 mg by mouth daily.     fenofibrate 160 MG tablet Take 160 mg by mouth  daily.     fluconazole (DIFLUCAN) 150 MG tablet Take by mouth in the morning and at bedtime.     gabapentin (NEURONTIN) 600 MG tablet Take 1,200 mg by mouth 3 (three) times daily.     HYDROcodone-acetaminophen (NORCO/VICODIN) 5-325 MG tablet Take by mouth.     levETIRAcetam (KEPPRA) 500 MG tablet Take 500 mg by mouth 2 (two) times daily.     lidocaine (LIDODERM) 5 % Place 1 patch onto the skin as needed for pain.     lidocaine (XYLOCAINE) 2 % solution SMARTSIG:By Mouth     LINZESS 72 MCG capsule Take 1 capsule (72 mcg total) by mouth daily. 90 capsule 4   magic mouthwash (lidocaine, diphenhydrAMINE, alum & mag hydroxide) suspension Take 5 mLs by mouth every 3 (three) hours as needed. Swish and spit for mouth or throat pain     mirabegron ER (MYRBETRIQ) 50 MG TB24 tablet Take 1 tablet by mouth daily.     omeprazole (PRILOSEC OTC) 20 MG tablet Take 1 tablet by mouth daily.     ondansetron (ZOFRAN) 8 MG tablet Take 1 tablet (8 mg total) by mouth 2 (two) times daily as needed. Start on the third day after chemotherapy. 30 tablet 1   prochlorperazine (COMPAZINE) 10 MG tablet Take 1 tablet (10 mg total) by mouth every 6 (six) hours as needed (Nausea or vomiting). 30 tablet 1   ramelteon (ROZEREM) 8 MG tablet Take 8 mg by mouth at bedtime.     Vitamin D, Ergocalciferol, (DRISDOL) 1.25 MG (50000 UT) CAPS capsule TAKE 1 CAPSULE BY MOUTH ONCE A WEEK FOR 30 DAYS     No current facility-administered medications for this visit.    HISTORY OF PRESENT ILLNESS:   Oncology History  Malignant neoplasm of central portion of left breast in female, estrogen receptor positive ([place])  09/16/2020 Mammogram   DIGITAL SCREENING BILATERAL MAMMOGRAM: In the left breast, a possible mass warrants further evaluation. In the right breast, no findings suspicious for malignancy.    10/08/2020 Mammogram   DIAGNOSTIC UNILATERAL LEFT MAMMOGRAM AND LEFT BREAST ULTRASOUND: There is architectural distortion at the left breast 12  o'clock.  Targeted ultrasound is performed, showing no focal abnormal discrete cystic or solid lesion at left breast 12 o'clock the correlate to the mammographic finding. Ultrasound of the left axilla is negative.   10/20/2020 Pathology Results   Breast, left, needle core biopsy, 12 o'clock:             Invasive mammary carcinoma             Mammary carcinoma in situ HER2: Negative (0) ER: Positive 30% PR: Negative 0% Ki67: 1%   10/26/2020 Initial Diagnosis   Malignant neoplasm of central portion of left breast in female, estrogen receptor positive ([place])   12/17/2020 Cancer Staging   Staging form: Breast, AJCC 8th Edition - Clinical stage from 12/17/2020: Stage IIA (cT1b, cN1(sn), cM0, G2, ER+, PR-, HER2-) - Signed by [name], MD on 02/13/2021 Histopathologic type:  Infiltrating duct carcinoma, NOS Stage prefix: Initial diagnosis Method of lymph node assessment: Sentinel lymph node biopsy Nuclear grade: G2 Multigene prognostic tests performed: EndoPredict Histologic grading system: 3 grade system Laterality: Left Tumor size (mm): 7 Lymph-vascular invasion (LVI): LVI not present (absent)/not identified Diagnostic confirmation: Positive histology Specimen type: Excision Staged by: Managing physician Menopausal status: Postmenopausal Ki-67 (%): 1 EndoPredict EPclin risk score: 5.4 EndoPredict EPclin risk level: High risk EndoPredict 10-year percentage risk of distant recurrence (%): 55 EndoPredict 10-year risk of distant recurrence: High risk Stage used in treatment planning: Yes National guidelines used in treatment planning: Yes Type of national guideline used in treatment planning: NCCN Staging comments: Endopredict with 27% benefit from chemotherapy   12/17/2020 Pathology Results   Breast, simple mastectomy, left:         Invasive ductal carcinoma, grade 2, 7 mm, and ductal carcinoma in situ         Ribbon clip and biopsy reaction present         All margins  negative for invasive carcinoma and DCIS         Fibrocystic changes Lymph node, sentinel, biopsy, left axillary:         Tumor present in regional lymph node (1/1)   02/18/2021 -  Chemotherapy   Patient is on Treatment Plan : Breast AC q21 Days / PACLitaxel q7d      Genetic Testing   Negative genetic testing. No pathogenic variants identified on the [facility] Multi-Cancer+RNA panel. The report date is 03/08/2021.  The Multi-Cancer Panel + RNA offered by [facility] includes sequencing and/or deletion duplication testing of the following 84 genes: AIP, ALK, APC, ATM, AXIN2,BAP1,  BARD1, BLM, BMPR1A, BRCA1, BRCA2, BRIP1, CASR, CDC73, CDH1, CDK4, CDKN1B, CDKN1C, CDKN2A (p14ARF), CDKN2A (p16INK4a), CEBPA, CHEK2, CTNNA1, DICER1, DIS3L2, EGFR (c.2369C>T, p.Thr790Met variant only), EPCAM (Deletion/duplication testing only), FH, FLCN, GATA2, GPC3, GREM1 (Promoter region deletion/duplication testing only), HOXB13 (c.251G>A, p.Gly84Glu), HRAS, KIT, MAX, MEN1, MET, MITF (c.952G>A, p.Glu318Lys variant only), MLH1, MSH2, MSH3, MSH6, MUTYH, NBN, NF1, NF2, NTHL1, PALB2, PDGFRA, PHOX2B, PMS2, POLD1, POLE, POT1, PRKAR1A, PTCH1, PTEN, RAD50, RAD51C, RAD51D, RB1, RECQL4, RET, RUNX1, SDHAF2, SDHA (sequence changes only), SDHB, SDHC, SDHD, SMAD4, SMARCA4, SMARCB1, SMARCE1, STK11, SUFU, TERC, TERT, TMEM127, TP53, TSC1, TSC2, VHL, WRN and WT1.       REVIEW OF SYSTEMS:   Constitutional: Denies fevers, chills or abnormal weight loss Eyes: Denies blurriness of vision Ears, nose, mouth, throat, and face: Denies mucositis or sore throat Respiratory: Denies cough, dyspnea or wheezes Cardiovascular: Denies palpitation, chest discomfort or lower extremity swelling Gastrointestinal:  Denies nausea, heartburn or change in bowel habits Skin: Denies abnormal skin rashes Lymphatics: Denies new lymphadenopathy or easy bruising Neurological:Denies numbness, tingling or new weaknesses Behavioral/Psych: Mood is stable, no new changes   All other systems were reviewed with the patient and are negative.   VITALS:  Blood pressure 111/70, pulse (!) 119, temperature 97.7 F (36.5 C), resp. rate 20, height _0  (1.626 m), weight 187 lb 9.6 oz (85.1 kg), SpO2 95 %.  Wt Readings from Last 3 Encounters:  07/11/21 187 lb 9.6 oz (85.1 kg)  06/27/21 184 lb 8 oz (83.7 kg)  06/10/21 190 lb 12.8 oz (86.5 kg)    Body mass index is 32.2 kg/m.  Performance status (ECOG): 1 - Symptomatic but completely ambulatory  PHYSICAL EXAM:   GENERAL:alert, no distress and comfortable SKIN: skin color, texture, turgor are normal, no rashes or significant lesions EYES: normal, Conjunctiva are pink and non-injected, sclera  clear OROPHARYNX:no exudate, no erythema and lips, buccal mucosa, and tongue normal  NECK: supple, thyroid normal size, non-tender, without nodularity LYMPH:  no palpable lymphadenopathy in the cervical, axillary or inguinal LUNGS: clear to auscultation and percussion with normal breathing effort HEART: regular rate & rhythm and no murmurs and no lower extremity edema ABDOMEN:abdomen soft, non-tender and normal bowel sounds Musculoskeletal:no cyanosis of digits and no clubbing  NEURO: alert & oriented x 3 with fluent speech, no focal motor/sensory deficits  LABORATORY DATA:  I have reviewed the data as listed    Component Value Date/Time   NA 135 (A) 07/11/2021 0000   K 4.1 07/11/2021 0000   CL 101 07/11/2021 0000   CO2 24 (A) 07/11/2021 0000   BUN 11 07/11/2021 0000   CREATININE 0.6 07/11/2021 0000   CALCIUM 10.2 07/11/2021 0000   ALBUMIN 4.2 07/11/2021 0000   AST 47 (A) 07/11/2021 0000   ALT 39 (A) 07/11/2021 0000   ALKPHOS 54 07/11/2021 0000    No results found for: "SPEP", "UPEP"  Lab Results  Component Value Date   WBC 6.5 07/11/2021   NEUTROABS 4.75 07/11/2021   HGB 13.0 07/11/2021   HCT 38 07/11/2021   MCV 97 06/03/2021   PLT 281 07/11/2021      Chemistry      Component Value Date/Time   NA  135 (A) 07/11/2021 0000   K 4.1 07/11/2021 0000   CL 101 07/11/2021 0000   CO2 24 (A) 07/11/2021 0000   BUN 11 07/11/2021 0000   CREATININE 0.6 07/11/2021 0000   GLU 257 07/11/2021 0000      Component Value Date/Time   CALCIUM 10.2 07/11/2021 0000   ALKPHOS 54 07/11/2021 0000   AST 47 (A) 07/11/2021 0000   ALT 39 (A) 07/11/2021 0000       RADIOGRAPHIC STUDIES: I have personally reviewed the radiological images as listed and agreed with the findings in the report. No results found.

## 2021-07-12 ENCOUNTER — Encounter: Payer: Self-pay | Admitting: Hematology and Oncology

## 2021-07-12 ENCOUNTER — Encounter: Payer: Self-pay | Admitting: Oncology

## 2021-07-12 NOTE — Assessment & Plan Note (Signed)
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 She was treated with left mastectomy.  One node was positive for malignancy.  EndoPredict has revealed her to be extremely high risk for recurrence, with an EP clin score of 5.4.  Her risk of recurrence in the next 10 years is 55% and she has an estimated chemotherapy benefit of 27%.  She completed 4 cycles of AC chemotherapy on May 2.  She is now receiving weekly paclitaxel. During her most recent treatment, cycle 9, she began to have seizure activity during her pre-meds and she was transported to the hospital via EMS. She was hospitalized, later transferred and treated for meningitis. She has been discharged and presents to clinic today to re-start therapy. She remains tired and states she doesn't remember everything from her admission. She denies any residual symptoms. She is anxious to get back on schedule for treatment. We will plan on giving her this week to rest up and proceed with cycle 9 on 07/18. She will return to clinic one week later for repeat evaluation.

## 2021-07-18 ENCOUNTER — Encounter: Payer: Self-pay | Admitting: Oncology

## 2021-07-18 MED FILL — Dexamethasone Sodium Phosphate Inj 100 MG/10ML: INTRAMUSCULAR | Qty: 1 | Status: AC

## 2021-07-18 MED FILL — Paclitaxel IV Conc 300 MG/50ML (6 MG/ML): INTRAVENOUS | Qty: 27 | Status: AC

## 2021-07-19 ENCOUNTER — Inpatient Hospital Stay: Payer: Medicare Other

## 2021-07-19 ENCOUNTER — Other Ambulatory Visit: Payer: Self-pay

## 2021-07-19 VITALS — BP 130/60 | HR 108 | Resp 20 | Wt 188.1 lb

## 2021-07-19 DIAGNOSIS — C50112 Malignant neoplasm of central portion of left female breast: Secondary | ICD-10-CM | POA: Diagnosis present

## 2021-07-19 DIAGNOSIS — Z170 Estrogen receptor positive status [ER+]: Secondary | ICD-10-CM | POA: Diagnosis not present

## 2021-07-19 DIAGNOSIS — Z5111 Encounter for antineoplastic chemotherapy: Secondary | ICD-10-CM | POA: Diagnosis present

## 2021-07-19 DIAGNOSIS — Z79899 Other long term (current) drug therapy: Secondary | ICD-10-CM | POA: Diagnosis not present

## 2021-07-19 MED ORDER — SODIUM CHLORIDE 0.9 % IV SOLN: Freq: Once | INTRAVENOUS | Status: AC

## 2021-07-19 MED ORDER — SODIUM CHLORIDE 0.9 % IV SOLN
80.0000 mg/m2 | Freq: Once | INTRAVENOUS | Status: AC
  Administered 2021-07-19: 162 mg via INTRAVENOUS
  Filled 2021-07-19: qty 27

## 2021-07-19 MED ORDER — SODIUM CHLORIDE 0.9 % IV SOLN
10.0000 mg | Freq: Once | INTRAVENOUS | Status: AC
  Administered 2021-07-19: 10 mg via INTRAVENOUS
  Filled 2021-07-19: qty 10

## 2021-07-19 MED ORDER — DIPHENHYDRAMINE HCL 50 MG/ML IJ SOLN
25.0000 mg | Freq: Once | INTRAMUSCULAR | Status: AC
  Administered 2021-07-19: 25 mg via INTRAVENOUS
  Filled 2021-07-19: qty 1

## 2021-07-19 MED ORDER — FAMOTIDINE IN NACL 20-0.9 MG/50ML-% IV SOLN
20.0000 mg | Freq: Once | INTRAVENOUS | Status: AC
  Administered 2021-07-19: 20 mg via INTRAVENOUS
  Filled 2021-07-19: qty 50

## 2021-07-19 MED ORDER — HEPARIN SOD (PORK) LOCK FLUSH 100 UNIT/ML IV SOLN
500.0000 [IU] | Freq: Once | INTRAVENOUS | Status: AC | PRN
  Administered 2021-07-19: 500 [IU]

## 2021-07-19 MED ORDER — SODIUM CHLORIDE 0.9% FLUSH
10.0000 mL | INTRAVENOUS | Status: DC | PRN
  Administered 2021-07-19: 10 mL

## 2021-07-19 NOTE — Patient Instructions (Signed)
[facility]  Discharge Instructions: Thank you for choosing [facility] to provide your oncology and hematology care.  If you have a lab appointment with the [facility], please go directly to the [facility] and check in at the registration area.   Wear comfortable clothing and clothing appropriate for easy access to any Portacath or PICC line.   We strive to give you quality time with your provider. You may need to reschedule your appointment if you arrive late (15 or more minutes).  Arriving late affects you and other patients whose appointments are after yours.  Also, if you miss three or more appointments without notifying the office, you may be dismissed from the clinic at the provider's discretion.      For prescription refill requests, have your pharmacy contact our office and allow 72 hours for refills to be completed.    Today you received the following chemotherapy and/or immunotherapy agents Taxol       To help prevent nausea and vomiting after your treatment, we encourage you to take your nausea medication as directed.  BELOW ARE SYMPTOMS THAT SHOULD BE REPORTED IMMEDIATELY: *FEVER GREATER THAN 100.4 F (38 C) OR HIGHER *CHILLS OR SWEATING *NAUSEA AND VOMITING THAT IS NOT CONTROLLED WITH YOUR NAUSEA MEDICATION *UNUSUAL SHORTNESS OF BREATH *UNUSUAL BRUISING OR BLEEDING *URINARY PROBLEMS (pain or burning when urinating, or frequent urination) *BOWEL PROBLEMS (unusual diarrhea, constipation, pain near the anus) TENDERNESS IN MOUTH AND THROAT WITH OR WITHOUT PRESENCE OF ULCERS (sore throat, sores in mouth, or a toothache) UNUSUAL RASH, SWELLING OR PAIN  UNUSUAL VAGINAL DISCHARGE OR ITCHING   Items with * indicate a potential emergency and should be followed up as soon as possible or go to the Emergency Department if any problems should occur.  Please show the CHEMOTHERAPY ALERT CARD or IMMUNOTHERAPY ALERT CARD at check-in to the  Emergency Department and triage nurse.  Should you have questions after your visit or need to cancel or reschedule your appointment, please contact [facility]  Dept: [phone]  and follow the prompts.  Office hours are 8:00 a.m. to 4:30 p.m. Monday - Friday. Please note that voicemails left after 4:00 p.m. may not be returned until the following business day.  We are closed weekends and major holidays. You have access to a nurse at all times for urgent questions. Please call the main number to the clinic Dept: [phone] and follow the prompts.  For any non-urgent questions, you may also contact your provider using MyChart. We now offer e-Visits for anyone 5 and older to request care online for non-urgent symptoms. For details visit [email or web address].   Also download the MyChart app! Go to the app store, search "MyChart", open the app, select [facility], and log in with your MyChart username and password.  Masks are optional in the cancer centers. If you would like for your care team to wear a mask while they are taking care of you, please let them know. For doctor visits, patients may have with them one support person who is at least 65 years old. At this time, visitors are not allowed in the infusion area.  Paclitaxel injection What is this medication? PACLITAXEL (PAK li TAX el) is a chemotherapy drug. It targets fast dividing cells, like cancer cells, and causes these cells to die. This medicine is used to treat ovarian cancer, breast cancer, lung cancer, Kaposi's sarcoma, and other cancers. This medicine may be used  for other purposes; ask your health care provider or pharmacist if you have questions. COMMON BRAND NAME(S): Onxol, Taxol What should I tell my care team before I take this medication? They need to know if you have any of these conditions: history of irregular heartbeat liver disease low blood counts, like low white cell, platelet, or red cell  counts lung or breathing disease, like asthma tingling of the fingers or toes, or other nerve disorder an unusual or allergic reaction to paclitaxel, alcohol, polyoxyethylated castor oil, other chemotherapy, other medicines, foods, dyes, or preservatives pregnant or trying to get pregnant breast-feeding How should I use this medication? This drug is given as an infusion into a vein. It is administered in a hospital or clinic by a specially trained health care professional. Talk to your pediatrician regarding the use of this medicine in children. Special care may be needed. Overdosage: If you think you have taken too much of this medicine contact a poison control center or emergency room at once. NOTE: This medicine is only for you. Do not share this medicine with others. What if I miss a dose? It is important not to miss your dose. Call your doctor or health care professional if you are unable to keep an appointment. What may interact with this medication? Do not take this medicine with any of the following medications: live virus vaccines This medicine may also interact with the following medications: antiviral medicines for hepatitis, HIV or AIDS certain antibiotics like erythromycin and clarithromycin certain medicines for fungal infections like ketoconazole and itraconazole certain medicines for seizures like carbamazepine, phenobarbital, phenytoin gemfibrozil nefazodone rifampin St. John's wort This list may not describe all possible interactions. Give your health care provider a list of all the medicines, herbs, non-prescription drugs, or dietary supplements you use. Also tell them if you smoke, drink alcohol, or use illegal drugs. Some items may interact with your medicine. What should I watch for while using this medication? Your condition will be monitored carefully while you are receiving this medicine. You will need important blood work done while you are taking this  medicine. This medicine can cause serious allergic reactions. To reduce your risk you will need to take other medicine(s) before treatment with this medicine. If you experience allergic reactions like skin rash, itching or hives, swelling of the face, lips, or tongue, tell your doctor or health care professional right away. In some cases, you may be given additional medicines to help with side effects. Follow all directions for their use. This drug may make you feel generally unwell. This is not uncommon, as chemotherapy can affect healthy cells as well as cancer cells. Report any side effects. Continue your course of treatment even though you feel ill unless your doctor tells you to stop. Call your doctor or health care professional for advice if you get a fever, chills or sore throat, or other symptoms of a cold or flu. Do not treat yourself. This drug decreases your body's ability to fight infections. Try to avoid being around people who are sick. This medicine may increase your risk to bruise or bleed. Call your doctor or health care professional if you notice any unusual bleeding. Be careful brushing and flossing your teeth or using a toothpick because you may get an infection or bleed more easily. If you have any dental work done, tell your dentist you are receiving this medicine. Avoid taking products that contain aspirin, acetaminophen, ibuprofen, naproxen, or ketoprofen unless instructed by your doctor. These  medicines may hide a fever. Do not become pregnant while taking this medicine. Women should inform their doctor if they wish to become pregnant or think they might be pregnant. There is a potential for serious side effects to an unborn child. Talk to your health care professional or pharmacist for more information. Do not breast-feed an infant while taking this medicine. Men are advised not to father a child while receiving this medicine. This product may contain alcohol. Ask your pharmacist  or healthcare provider if this medicine contains alcohol. Be sure to tell all healthcare providers you are taking this medicine. Certain medicines, like metronidazole and disulfiram, can cause an unpleasant reaction when taken with alcohol. The reaction includes flushing, headache, nausea, vomiting, sweating, and increased thirst. The reaction can last from 30 minutes to several hours. What side effects may I notice from receiving this medication? Side effects that you should report to your doctor or health care professional as soon as possible: allergic reactions like skin rash, itching or hives, swelling of the face, lips, or tongue breathing problems changes in vision fast, irregular heartbeat high or low blood pressure mouth sores pain, tingling, numbness in the hands or feet signs of decreased platelets or bleeding - bruising, pinpoint red spots on the skin, black, tarry stools, blood in the urine signs of decreased red blood cells - unusually weak or tired, feeling faint or lightheaded, falls signs of infection - fever or chills, cough, sore throat, pain or difficulty passing urine signs and symptoms of liver injury like dark yellow or brown urine; general ill feeling or flu-like symptoms; light-colored stools; loss of appetite; nausea; right upper belly pain; unusually weak or tired; yellowing of the eyes or skin swelling of the ankles, feet, hands unusually slow heartbeat Side effects that usually do not require medical attention (report to your doctor or health care professional if they continue or are bothersome): diarrhea hair loss loss of appetite muscle or joint pain nausea, vomiting pain, redness, or irritation at site where injected tiredness This list may not describe all possible side effects. Call your doctor for medical advice about side effects. You may report side effects to FDA at 1-800-FDA-1088. Where should I keep my medication? This drug is given in a hospital or  clinic and will not be stored at home. NOTE: This sheet is a summary. It may not cover all possible information. If you have questions about this medicine, talk to your doctor, pharmacist, or health care provider.  2023 Elsevier/Gold Standard (2020-11-19 00:00:00)

## 2021-07-22 NOTE — Assessment & Plan Note (Addendum)
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 Shewas treated withleft mastectomy. One node was positive for malignancy. EndoPredict has revealed her to be at extremely high riskfor recurrence,with an EP clin score of 5.4.Her risk of recurrence in the next 10 years is 55% and she has an estimated chemotherapy benefit of 27%. She completed 4 cycles of AC chemotherapy in May. She is now receiving weekly paclitaxel. During cycle 9, she began to have seizure activity during her pre-meds and she was transported to the hospital via EMS. She was hospitalized, later transferred and treated for meningitis. She was discharged and seen on July 10 to restart therapy.  She remained fatigued and did not remember everything from her admission. She denied any residual symptoms.  She received a 9th cycle of paclitaxel on July 18.  She has increased general muscle weakness since receiving paclitaxel.  She still describes episodes of shakiness.  Her husband states that she did not remember she had this appointment today when he went to get her out of bed.  I will send her for stat CT head and hold paclitaxel this week.  If she does not improve, further evaluation will need to be done.  We will otherwise plan to see her back in 1 week for consideration of further chemotherapy.

## 2021-07-22 NOTE — Progress Notes (Addendum)
[facility]  [place],  [place]  [address] [phone]   Addendum: Stat CT head without contrast did not reveal any acute intracranial abnormality to explain her symptoms.  I discussed her case with Teodora Medici, PA-C her neurology provider.  She thought the muscle weakness could perhaps be a sequelae of seizure.  The patient may not realize she is having a seizure if not witnessed by a family member.  She recommends increasing Keppra to 750 mg twice daily.  If she does not improve we may obtain another MRI head contrast only.  When I called the patient's husband to let him know about this, he reports that his wife has been very inactive since 2013 after having shingles affecting her back, in addition to her fibromyalgia and chronic fatigue.  More recently, he states she has been staying in bed most of the day.  I have not elicited that history from her previously.  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 She was treated with left mastectomy.  One node was positive for malignancy.  EndoPredict has revealed her to be at extremely high risk for recurrence, with an EP clin score of 5.4.  Her risk of recurrence in the next 10 years is 55% and she has an estimated chemotherapy benefit of 27%.  She completed 4 cycles of AC chemotherapy in May.  She is now receiving weekly paclitaxel. During cycle 9, she began to have seizure activity during her pre-meds and she was transported to the hospital via EMS. She was hospitalized, later transferred and treated for meningitis. She was discharged and seen on July 10 to restart therapy.  She remained fatigued and did not remember  everything from her admission. She denied any residual symptoms.  She received a 9th cycle of paclitaxel on July 18.  She has increased general muscle weakness since receiving paclitaxel.  She still describes episodes of shakiness.  Her husband states that she did not remember she had this appointment today when he went to get her out of bed.  I will send her for stat CT head and hold paclitaxel this week.  If she does not improve, further evaluation will need to be done.  We will otherwise plan to see her back in 1 week for consideration of further chemotherapy.  Muscle weakness (generalized) She has increasing weakness especially in the bilateral upper extremities, as well as shakiness, of uncertain etiology.  This potentially could be related to her paclitaxel chemotherapy, so I will plan to hold chemotherapy week.  I will obtain a stat CT head today to rule out acute abnormality.   The patient understands the plans discussed today and is in agreement with them.  She knows to contact our office if she develops concerns prior to her next appointment.   I provided 30 minutes of face-to-face time during this encounter and > 50% was spent counseling as documented under my assessment and plan.    Marvia Pickles, [place] [address] [place] [address] Dept: [phone] Dept Fax: [phone]   Orders Placed This Encounter  Procedures   CT HEAD W &  WO CONTRAST ( )    Standing Status:   Future    Standing Expiration Date:   07/26/2022    Order Specific Question:   If indicated for the ordered procedure, I authorize the administration of contrast media per Radiology protocol    Answer:   Yes    Order Specific Question:   Preferred imaging location?    Answer:   External   CBC and differential    This external order was created through the Results Console.   CBC    This external order was created through the  Results Console.   Basic metabolic panel    This external order was created through the Results Console.   Comprehensive metabolic panel    This external order was created through the Results Console.   Hepatic function panel    This external order was created through the Results Console.      CHIEF COMPLAINT:  CC: Stage IIA hormone receptor positive left breast cancer  Current Treatment: Adjuvant weekly paclitaxel  HISTORY OF PRESENT ILLNESS:   Oncology History  Malignant neoplasm of central portion of left breast in female, estrogen receptor positive (HCC)  09/16/2020 Mammogram   DIGITAL SCREENING BILATERAL MAMMOGRAM: In the left breast, a possible mass warrants further evaluation. In the right breast, no findings suspicious for malignancy.    10/08/2020 Mammogram   DIAGNOSTIC UNILATERAL LEFT MAMMOGRAM AND LEFT BREAST ULTRASOUND: There is architectural distortion at the left breast 12 o'clock.  Targeted ultrasound is performed, showing no focal abnormal discrete cystic or solid lesion at left breast 12 o'clock the correlate to the mammographic finding. Ultrasound of the left axilla is negative.   10/20/2020 Pathology Results   Breast, left, needle core biopsy, 12 o'clock:             Invasive mammary carcinoma             Mammary carcinoma in situ HER2: Negative (0) ER: Positive 30% PR: Negative 0% Ki67: 1%   10/26/2020 Initial Diagnosis   Malignant neoplasm of central portion of left breast in female, estrogen receptor positive (HCC)   12/17/2020 Cancer Staging   Staging form: Breast, AJCC 8th Edition - Clinical stage from 12/17/2020: Stage IIA (cT1b, cN1(sn), cM0, G2, ER+, PR-, HER2-) - Signed by Dellia Beckwith, MD on 02/13/2021 Histopathologic type: Infiltrating duct carcinoma, NOS Stage prefix: Initial diagnosis Method of lymph node assessment: Sentinel lymph node biopsy Nuclear grade: G2 Multigene prognostic tests performed: EndoPredict Histologic grading  system: 3 grade system Laterality: Left Tumor size (mm): 7 Lymph-vascular invasion (LVI): LVI not present (absent)/not identified Diagnostic confirmation: Positive histology Specimen type: Excision Staged by: Managing physician Menopausal status: Postmenopausal Ki-67 (%): 1 EndoPredict EPclin risk score: 5.4 EndoPredict EPclin risk level: High risk EndoPredict 10-year percentage risk of distant recurrence (%): 55 EndoPredict 10-year risk of distant recurrence: High risk Stage used in treatment planning: Yes National guidelines used in treatment planning: Yes Type of national guideline used in treatment planning: NCCN Staging comments: Endopredict with 27% benefit from chemotherapy   12/17/2020 Pathology Results   Breast, simple mastectomy, left:         Invasive ductal carcinoma, grade 2, 7 mm, and ductal carcinoma in situ         Ribbon clip and biopsy reaction present         All margins negative for invasive carcinoma and DCIS         Fibrocystic changes Lymph node, sentinel, biopsy, left axillary:  Tumor present in regional lymph node (1/1)   02/18/2021 -  Chemotherapy   Patient is on Treatment Plan : Breast AC q21 Days / PACLitaxel q7d      Genetic Testing   Negative genetic testing. No pathogenic variants identified on the [facility] Multi-Cancer+RNA panel. The report date is 03/08/2021.  The Multi-Cancer Panel + RNA offered by [facility] includes sequencing and/or deletion duplication testing of the following 84 genes: AIP, ALK, APC, ATM, AXIN2,BAP1,  BARD1, BLM, BMPR1A, BRCA1, BRCA2, BRIP1, CASR, CDC73, CDH1, CDK4, CDKN1B, CDKN1C, CDKN2A (p14ARF), CDKN2A (p16INK4a), CEBPA, CHEK2, CTNNA1, DICER1, DIS3L2, EGFR (c.2369C>T, p.Thr790Met variant only), EPCAM (Deletion/duplication testing only), FH, FLCN, GATA2, GPC3, GREM1 (Promoter region deletion/duplication testing only), HOXB13 (c.251G>A, p.Gly84Glu), HRAS, KIT, MAX, MEN1, MET, MITF (c.952G>A, p.Glu318Lys variant only), MLH1,  MSH2, MSH3, MSH6, MUTYH, NBN, NF1, NF2, NTHL1, PALB2, PDGFRA, PHOX2B, PMS2, POLD1, POLE, POT1, PRKAR1A, PTCH1, PTEN, RAD50, RAD51C, RAD51D, RB1, RECQL4, RET, RUNX1, SDHAF2, SDHA (sequence changes only), SDHB, SDHC, SDHD, SMAD4, SMARCA4, SMARCB1, SMARCE1, STK11, SUFU, TERC, TERT, TMEM127, TP53, TSC1, TSC2, VHL, WRN and WT1.       INTERVAL HISTORY:  Kare is here today for repeat clinical assessment prior to 10th cycle of paclitaxel.  She initially seemed to do well but now reports increasing general muscle weakness and shakiness.  She also reports dizziness.  She states she has had blurry vision since her meningitis.  She denies headaches or neck pain.  She has orthostatic hypotension and is on midodrine, which was recently increased to 3 times daily.  She reports mild neuropathy of her feet with paresthesias and numbness.  She denies fevers or chills. She denies pain. Her appetite is good. Her weight has decreased 6 pounds over last week .  Her husband accompanies her and states that she did not remember she had an appointment when he went to get her up today.  REVIEW OF SYSTEMS:  Review of Systems  Constitutional:  Positive for fatigue. Negative for appetite change, chills, fever and unexpected weight change.  HENT:   Negative for lump/mass, mouth sores and sore throat.   Respiratory:  Negative for cough and shortness of breath.   Cardiovascular:  Negative for chest pain and leg swelling.  Gastrointestinal:  Negative for abdominal pain, constipation, diarrhea, nausea and vomiting.  Endocrine: Negative for hot flashes.  Genitourinary:  Negative for difficulty urinating, dysuria, frequency and hematuria.   Musculoskeletal:  Positive for gait problem. Negative for arthralgias, back pain and myalgias.  Skin:  Negative for rash.  Neurological:  Positive for dizziness, extremity weakness, gait problem and numbness. Negative for headaches.  Hematological:  Negative for adenopathy. Does not  bruise/bleed easily.  Psychiatric/Behavioral:  Negative for depression and sleep disturbance. The patient is not nervous/anxious.      VITALS:  Blood pressure (!) 93/56, pulse (!) 101, temperature 97.9 F (36.6 C), temperature source Oral, resp. rate 20, height $RemoveBe'5\' 4"'WZwCpBmmf$  (1.626 m), weight 181 lb 12.8 oz (82.5 kg), SpO2 95 %.  Wt Readings from Last 3 Encounters:  07/25/21 181 lb 12.8 oz (82.5 kg)  07/19/21 188 lb 1.9 oz (85.3 kg)  07/11/21 187 lb 9.6 oz (85.1 kg)    Body mass index is 31.21 kg/m.  Performance status (ECOG): 2 - Symptomatic, <50% confined to bed  PHYSICAL EXAM:  Physical Exam Vitals and nursing note reviewed.  Constitutional:      General: She is not in acute distress.    Appearance: Normal appearance.  HENT:     Head: Normocephalic  and atraumatic.     Mouth/Throat:     Mouth: Mucous membranes are moist.     Pharynx: Oropharynx is clear. No oropharyngeal exudate or posterior oropharyngeal erythema.  Eyes:     General: No scleral icterus.    Extraocular Movements: Extraocular movements intact.     Conjunctiva/sclera: Conjunctivae normal.     Pupils: Pupils are equal, round, and reactive to light.  Cardiovascular:     Rate and Rhythm: Normal rate and regular rhythm.     Heart sounds: Normal heart sounds. No murmur heard.    No friction rub. No gallop.  Pulmonary:     Effort: Pulmonary effort is normal.     Breath sounds: Normal breath sounds. No wheezing, rhonchi or rales.  Abdominal:     General: There is no distension.     Palpations: Abdomen is soft. There is no hepatomegaly, splenomegaly or mass.     Tenderness: There is no abdominal tenderness.  Musculoskeletal:        General: Normal range of motion.     Cervical back: Normal range of motion and neck supple. No tenderness.     Right lower leg: No edema.     Left lower leg: No edema.  Lymphadenopathy:     Cervical: No cervical adenopathy.     Upper Body:     Right upper body: No supraclavicular or  axillary adenopathy.     Left upper body: No supraclavicular or axillary adenopathy.     Lower Body: No right inguinal adenopathy. No left inguinal adenopathy.  Skin:    General: Skin is warm and dry.     Coloration: Skin is not jaundiced.     Findings: No rash.  Neurological:     Mental Status: She is alert and oriented to person, place, and time.     Cranial Nerves: No cranial nerve deficit.     Motor: Weakness and tremor present.     Comments: Motor strength is 3 out of 5 in the bilateral upper extremities and 4 out of 5 in the bilateral lower extremities with intention tremor  Psychiatric:        Mood and Affect: Mood normal.        Behavior: Behavior normal.        Thought Content: Thought content normal.    LABS:      Latest Ref Rng & Units 07/25/2021   12:00 AM 07/11/2021   12:00 AM 06/27/2021   12:00 AM  CBC  WBC  4.5     6.5     5.6      Hemoglobin 12.0 - 16.0 13.6     13.0     12.7      Hematocrit 36 - 46 39     38     37      Platelets 150 - 400 K/uL 266     281     274         This result is from an external source.      Latest Ref Rng & Units 07/25/2021   12:00 AM 07/11/2021   12:00 AM 06/27/2021   12:00 AM  CMP  BUN 4 - $R'21 18     11     11      'kX$ Creatinine 0.5 - 1.1 0.6     0.6     0.6      Sodium 137 - 147 135     135     134  Potassium 3.5 - 5.1 mEq/L 4.1     4.1     4.3      Chloride 99 - 108 101     101     100      CO2 13 - $Re'22 25     24     26      'TdX$ Calcium 8.7 - 10.7 10.1     10.2     9.7      Alkaline Phos 25 - 125 56     54     37      AST 13 - 35 42     47     71      ALT 7 - 35 U/L 45     39     48         This result is from an external source.     No results found for: "CEA1", "CEA" / No results found for: "CEA1", "CEA" No results found for: "PSA1" No results found for: "PJK932" No results found for: "CAN125"  No results found for: "TOTALPROTELP", "ALBUMINELP", "A1GS", "A2GS", "BETS", "BETA2SER", "GAMS", "MSPIKE", "SPEI" No results  found for: "TIBC", "FERRITIN", "IRONPCTSAT" No results found for: "LDH"  STUDIES:  No results found.    HISTORY:   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 02/12/2013   Depression with anxiety 08/30/2013   Diet-controlled diabetes mellitus ([place]) 10/31/2019   Difficulty in walking 08/30/2013   Disorder of sacrum 02/12/2013   Dyspnea on exertion 10/29/2020   Epilepsy ([place]) 08/30/2013   Excessive daytime sleepiness 07/03/2017   Family history of breast cancer    Family history of colon cancer    Family history of first degree relative with dementia 10/26/2017   Family history of lung cancer    Family history of prostate cancer    Family history of uterine cancer    Fibromyalgia    Gait abnormality 07/10/2012   Genetic testing 03/09/2021   Negative genetic testing. No pathogenic variants identified on the [facility] Multi-Cancer+RNA panel. The report date is 03/08/2021.  The Multi-Cancer Panel + RNA offered by [facility] includes sequencing and/or deletion duplication testing of the following 84 genes: AIP, ALK, APC, ATM, AXIN2,BAP1,  BARD1, BLM, BMPR1A, BRCA1, BRCA2, BRIP1, CASR, CDC73, CDH1, CDK4, CDKN1B, CDKN1C, CDKN2A (p14ARF), CDKN2A (   Herpes zoster with nervous system complications 67/12/4578   History of seizure disorder 03/17/2019   Hypercholesteremia    Hyperlipidemia 08/30/2013   IBS (irritable bowel syndrome)    Insomnia 09/07/2015   Malignant neoplasm of central portion of left breast in female, estrogen receptor positive ([place]) 10/26/2020   Mass of upper outer quadrant of left breast 05/21/2017   Medication management  01/31/2019   Migraine headache 08/30/2013   Mild cognitive impairment 03/17/2019   Muscle weakness (generalized) 07/25/2021   Myalgia and myositis 02/12/2013   Neurogenic bladder 05/21/2020   Neuropathic pain 07/22/2020   Neuropathy 03/17/2019   Obesity, Class I, BMI 30-34.9 03/17/2019   Obstructive sleep apnea (adult) (pediatric) 08/30/2013   Pain in thoracic spine 02/12/2013   Palpitations 09/23/2019   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  Postlaminectomy syndrome, lumbar region 02/12/2013   Postoperative examination 06/19/2017   Postural hypotension 09/23/2019   Restless legs syndrome 05/22/2017   Risk for falls 09/07/2015   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   Seizures ([place])    Sleep apnea    nightly cpap   Spondylosis of thoracic region without myelopathy or radiculopathy 02/12/2013   Thoracic or lumbosacral neuritis or radiculitis 02/12/2013   Thoracic spondylosis with myelopathy 02/12/2013   Tremors of nervous system    Urinary incontinence 03/17/2019   Urinary incontinence, mixed 05/29/2013   Vitamin B 12 deficiency 03/17/2019   Vitamin D deficiency 03/17/2019   Weakness 07/10/2012    Past Surgical History:  Procedure Laterality Date   BLADDER REPAIR  03/13/2018   BLADDER SUSPENSION  03/13/2018   BREAST LUMPECTOMY  2019   COLONOSCOPY  12/20/2012   Left colonic diverticulosis predominantly in the sigmoid colon. Small internal hemmorhoids. Otherwise normal colonoscopy.    LAMINECTOMY  1998    Family History  Problem Relation Age of Onset   Hypercholesterolemia Mother    Alzheimer's disease Mother    Heart Problems Father    Clotting disorder Father    Alzheimer's disease Father    Prostate cancer Father        dx 79s   Allergic rhinitis Sister    Uterine cancer Maternal Aunt        dx 51s, d. 5   Breast cancer Maternal Aunt 60       recurrence, metastatic, d. 71   Hemophilia Maternal Aunt    Breast cancer  Maternal Aunt        dx 4-s d. 59s   Lung cancer Maternal Uncle 50       smoker   Lung cancer Maternal Uncle        48s   Breast cancer Paternal Aunt 74       dx 42s d. 12, 3 recurrences   Liver cancer Paternal Uncle    Kidney cancer Paternal Uncle    Colon cancer Maternal Grandfather        25s   Brain cancer Paternal Grandfather        61s   Colon cancer Cousin    Breast cancer Cousin        dx 84s, bilateral mastectomy   Esophageal cancer Neg Hx    Stomach cancer Neg Hx    Rectal cancer Neg Hx     Social History:  reports that she has never smoked. She has never used smokeless tobacco. She reports that she does not drink alcohol and does not use drugs.The patient is accompanied by her husband today.  Allergies:  Allergies  Allergen Reactions   Nitrofurantoin Anaphylaxis   Sulfa Antibiotics Rash, Other (See Comments) and Hives    Other reaction(s): Rash     Current Medications: Current Outpatient Medications  Medication Sig Dispense Refill   dexamethasone (DECADRON) 4 MG tablet Take 3 tabs at the night before and 3 tab the morning of chemotherapy 60 tablet 0   dicyclomine (BENTYL) 10 MG capsule Take 10 mg by mouth 3 (three) times daily as needed for pain. With IBS     DULoxetine (CYMBALTA) 60 MG capsule Take 1 capsule by mouth at bedtime.     EPINEPHrine 0.3 mg/0.3 mL IJ SOAJ injection Inject 0.3 mg into the muscle as needed for anaphylaxis.     estradiol (ESTRACE) 0.1 MG/GM vaginal cream Place 1 Applicatorful vaginally at bedtime. 42.5 g 0  ezetimibe (ZETIA) 10 MG tablet Take 10 mg by mouth daily.     fenofibrate 160 MG tablet Take 160 mg by mouth daily.     gabapentin (NEURONTIN) 600 MG tablet Take 1,200 mg by mouth 3 (three) times daily.     HYDROcodone-acetaminophen (NORCO/VICODIN) 5-325 MG tablet Take by mouth.     levETIRAcetam (KEPPRA) 500 MG tablet Take 500 mg by mouth 2 (two) times daily.     lidocaine (LIDODERM) 5 % Place 1 patch onto the skin as needed for  pain.     lidocaine (XYLOCAINE) 2 % solution SMARTSIG:By Mouth     LINZESS 72 MCG capsule Take 1 capsule (72 mcg total) by mouth daily. 90 capsule 4   magic mouthwash (lidocaine, diphenhydrAMINE, alum & mag hydroxide) suspension Take 5 mLs by mouth every 3 (three) hours as needed. Swish and spit for mouth or throat pain     midodrine (PROAMATINE) 5 MG tablet Take 5 mg by mouth 3 (three) times daily with meals.     mirabegron ER (MYRBETRIQ) 50 MG TB24 tablet Take 1 tablet by mouth daily.     omeprazole (PRILOSEC OTC) 20 MG tablet Take 1 tablet by mouth daily.     ondansetron (ZOFRAN) 8 MG tablet Take 1 tablet (8 mg total) by mouth 2 (two) times daily as needed. Start on the third day after chemotherapy. 30 tablet 1   pramipexole (MIRAPEX) 1 MG tablet Take 1 mg by mouth at bedtime.     prochlorperazine (COMPAZINE) 10 MG tablet Take 1 tablet (10 mg total) by mouth every 6 (six) hours as needed (Nausea or vomiting). 30 tablet 1   ramelteon (ROZEREM) 8 MG tablet Take 8 mg by mouth at bedtime.     Vitamin D, Ergocalciferol, (DRISDOL) 1.25 MG (50000 UT) CAPS capsule TAKE 1 CAPSULE BY MOUTH ONCE A WEEK FOR 30 DAYS     No current facility-administered medications for this visit.

## 2021-07-24 ENCOUNTER — Encounter: Payer: Self-pay | Admitting: Hematology and Oncology

## 2021-07-24 ENCOUNTER — Encounter: Payer: Self-pay | Admitting: Oncology

## 2021-07-25 ENCOUNTER — Inpatient Hospital Stay (INDEPENDENT_AMBULATORY_CARE_PROVIDER_SITE_OTHER): Payer: Medicare Other | Admitting: Hematology and Oncology

## 2021-07-25 ENCOUNTER — Other Ambulatory Visit: Payer: Self-pay

## 2021-07-25 ENCOUNTER — Inpatient Hospital Stay: Payer: Medicare Other

## 2021-07-25 ENCOUNTER — Encounter: Payer: Self-pay | Admitting: Hematology and Oncology

## 2021-07-25 VITALS — BP 93/56 | HR 101 | Temp 97.9°F | Resp 20 | Ht 64.0 in | Wt 181.8 lb

## 2021-07-25 DIAGNOSIS — M6281 Muscle weakness (generalized): Secondary | ICD-10-CM | POA: Diagnosis not present

## 2021-07-25 DIAGNOSIS — Z170 Estrogen receptor positive status [ER+]: Secondary | ICD-10-CM | POA: Diagnosis not present

## 2021-07-25 DIAGNOSIS — C50112 Malignant neoplasm of central portion of left female breast: Secondary | ICD-10-CM | POA: Diagnosis not present

## 2021-07-25 DIAGNOSIS — R42 Dizziness and giddiness: Secondary | ICD-10-CM

## 2021-07-25 LAB — CBC AND DIFFERENTIAL
HCT: 39 (ref 36–46)
Hemoglobin: 13.6 (ref 12.0–16.0)
Neutrophils Absolute: 2.79
Platelets: 266 10*3/uL (ref 150–400)
WBC: 4.5

## 2021-07-25 LAB — BASIC METABOLIC PANEL
BUN: 18 (ref 4–21)
CO2: 25 — AB (ref 13–22)
Chloride: 101 (ref 99–108)
Creatinine: 0.6 (ref 0.5–1.1)
Glucose: 164
Potassium: 4.1 mEq/L (ref 3.5–5.1)
Sodium: 135 — AB (ref 137–147)

## 2021-07-25 LAB — HEPATIC FUNCTION PANEL
ALT: 45 U/L — AB (ref 7–35)
AST: 42 — AB (ref 13–35)
Alkaline Phosphatase: 56 (ref 25–125)
Bilirubin, Total: 0.8

## 2021-07-25 LAB — CBC: RBC: 4.02 (ref 3.87–5.11)

## 2021-07-25 LAB — COMPREHENSIVE METABOLIC PANEL
Albumin: 4.5 (ref 3.5–5.0)
Calcium: 10.1 (ref 8.7–10.7)

## 2021-07-25 MED FILL — Paclitaxel IV Conc 300 MG/50ML (6 MG/ML): INTRAVENOUS | Qty: 27 | Status: AC

## 2021-07-25 NOTE — Assessment & Plan Note (Addendum)
She has increasing weakness especially in the bilateral upper extremities, as well as shakiness, of uncertain etiology.  This potentially could be related to her paclitaxel chemotherapy, so I will plan to hold chemotherapy week.  I will obtain a stat CT head today to rule out acute abnormality.

## 2021-07-26 ENCOUNTER — Ambulatory Visit: Payer: Medicare Other

## 2021-07-26 ENCOUNTER — Encounter: Payer: Self-pay | Admitting: Hematology and Oncology

## 2021-07-27 ENCOUNTER — Other Ambulatory Visit: Payer: Self-pay | Admitting: Oncology

## 2021-08-01 ENCOUNTER — Inpatient Hospital Stay: Payer: Medicare Other

## 2021-08-01 ENCOUNTER — Encounter: Payer: Self-pay | Admitting: Hematology and Oncology

## 2021-08-01 ENCOUNTER — Other Ambulatory Visit: Payer: Medicare Other

## 2021-08-01 ENCOUNTER — Inpatient Hospital Stay (INDEPENDENT_AMBULATORY_CARE_PROVIDER_SITE_OTHER): Payer: Medicare Other | Admitting: Hematology and Oncology

## 2021-08-01 ENCOUNTER — Encounter: Payer: Self-pay | Admitting: Oncology

## 2021-08-01 DIAGNOSIS — C50112 Malignant neoplasm of central portion of left female breast: Secondary | ICD-10-CM

## 2021-08-01 DIAGNOSIS — Z170 Estrogen receptor positive status [ER+]: Secondary | ICD-10-CM | POA: Diagnosis not present

## 2021-08-01 LAB — HEPATIC FUNCTION PANEL
ALT: 42 U/L — AB (ref 7–35)
AST: 45 — AB (ref 13–35)
Alkaline Phosphatase: 58 (ref 25–125)
Bilirubin, Total: 0.7

## 2021-08-01 LAB — BASIC METABOLIC PANEL
BUN: 12 (ref 4–21)
CO2: 26 — AB (ref 13–22)
Chloride: 104 (ref 99–108)
Creatinine: 0.8 (ref 0.5–1.1)
Glucose: 127
Potassium: 4.1 mEq/L (ref 3.5–5.1)
Sodium: 136 — AB (ref 137–147)

## 2021-08-01 LAB — COMPREHENSIVE METABOLIC PANEL
Albumin: 4.3 (ref 3.5–5.0)
Calcium: 10.1 (ref 8.7–10.7)

## 2021-08-01 LAB — CBC: RBC: 4 (ref 3.87–5.11)

## 2021-08-01 LAB — CBC AND DIFFERENTIAL
HCT: 39 (ref 36–46)
Hemoglobin: 13.1 (ref 12.0–16.0)
Neutrophils Absolute: 2.96
Platelets: 285 10*3/uL (ref 150–400)
WBC: 5.2

## 2021-08-01 NOTE — Progress Notes (Signed)
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 She was treated with left mastectomy.  One node was positive for malignancy.  EndoPredict has revealed her to be at extremely high risk for recurrence, with an EP clin score of 5.4.  Her risk of recurrence in the next 10 years is 55% and she has an estimated chemotherapy benefit of 27%.  She completed 4 cycles of AC chemotherapy in May.  She is now receiving weekly paclitaxel. During cycle 9, she began to have seizure activity during her pre-meds and she was transported to the hospital via EMS. She was hospitalized, later transferred and treated for meningitis. She was discharged and seen on July 10 to restart therapy.  She remained fatigued and did not remember everything from her admission. She denied any residual symptoms.  She received a 9th cycle of paclitaxel on July 18.  She has increased general muscle weakness since receiving paclitaxel.  She still described episodes of shakiness. She was sent for CT of the head due to some noted confusion. This was negative. Today, she reports feeling much better. She did have changes made to her medications by neuro and denies any further incident of shakiness. We decided to give her one more week without treatment to observe off of treatment. She did ask that no further information go through  her husband as he has difficulty communicating information. We will see her back in one week for repeat evaluation and possible restarting chemotherapy.    The patient understands the plans discussed today and is in agreement with them.  She knows to contact our office if she develops concerns prior to her next appointment.    Melodye Ped, NP  [place] [address] [place] [address] Dept: [phone] Dept Fax: [phone]   Orders Placed This Encounter  Procedures   CBC and differential    This external order was created through the Results Console.   CBC    This external order was created through the Results Console.   Basic metabolic panel    This external order was created through the Results Console.   Comprehensive metabolic panel    This external order was created through the Results Console.   Hepatic function panel    This external order was created through the Results Console.      CHIEF COMPLAINT:  CC: A 66 year old female with history of breast cancer here for one week evaluation  Current Treatment:  Paclitaxel; on hold  INTERVAL HISTORY:  Brandy Maxwell is here today for repeat clinical assessment. She denies fevers or chills. She denies pain. Her appetite is good. Her weight has been stable.  I have reviewed the past medical history, past surgical history, social history and family history with the patient and they are unchanged from previous note.  ALLERGIES:  is allergic to nitrofurantoin and sulfa antibiotics.  MEDICATIONS:  Current Outpatient Medications  Medication Sig Dispense Refill   dexamethasone (DECADRON) 4 MG tablet Take 3 tabs at the night before and 3 tab the morning of chemotherapy 60 tablet 0   dicyclomine (BENTYL) 10 MG capsule Take 10 mg by mouth 3 (three) times daily as needed for pain. With IBS     DULoxetine (CYMBALTA) 60 MG capsule Take 1 capsule by mouth at  bedtime.     EPINEPHrine 0.3 mg/0.3 mL IJ SOAJ injection Inject 0.3 mg into the muscle as needed for anaphylaxis.     estradiol (ESTRACE) 0.1 MG/GM vaginal cream Place 1 Applicatorful vaginally at bedtime. 42.5 g 0   ezetimibe (ZETIA) 10 MG tablet Take 10 mg by mouth daily.     fenofibrate 160 MG tablet Take 160 mg by mouth daily.     gabapentin (NEURONTIN) 600 MG tablet Take 1,200 mg by mouth 3 (three) times daily.     HYDROcodone-acetaminophen (NORCO/VICODIN) 5-325 MG tablet Take by mouth.     levETIRAcetam (KEPPRA) 750 MG tablet Take 750 mg by mouth 2 (two) times daily.     lidocaine (LIDODERM) 5 % Place 1 patch onto the skin as needed for pain.     lidocaine (XYLOCAINE) 2 % solution SMARTSIG:By Mouth     LINZESS 72 MCG capsule Take 1 capsule (72 mcg total) by mouth daily. 90 capsule 4   magic mouthwash (lidocaine, diphenhydrAMINE, alum & mag hydroxide) suspension Take 5 mLs by mouth every 3 (three) hours as needed. Swish and spit for mouth or throat pain     midodrine (PROAMATINE) 5 MG tablet Take 5 mg by mouth 3 (three) times daily with meals.     mirabegron ER (MYRBETRIQ) 50 MG TB24 tablet Take 1 tablet by mouth daily.     omeprazole (PRILOSEC OTC) 20 MG tablet Take 1 tablet by mouth daily.     ondansetron (ZOFRAN) 8 MG tablet Take 1 tablet (8 mg total) by mouth 2 (two) times daily as needed. Start on the third day after chemotherapy. 30 tablet 1   pramipexole (MIRAPEX) 1 MG tablet Take 1 mg by mouth at bedtime. Takes 1/2 tab     prochlorperazine (COMPAZINE) 10 MG tablet Take 1 tablet (10 mg total) by mouth every 6 (six) hours as needed (Nausea or vomiting). 30 tablet 1   ramelteon (ROZEREM) 8 MG tablet Take 8 mg by mouth at bedtime.     Vitamin D, Ergocalciferol, (DRISDOL) 1.25 MG (50000 UT) CAPS capsule TAKE 1 CAPSULE BY MOUTH ONCE A WEEK FOR 30 DAYS     No current facility-administered medications for this visit.    HISTORY OF PRESENT ILLNESS:   Oncology History  Malignant  neoplasm of central portion of left breast in female, estrogen receptor positive ([place])  09/16/2020 Mammogram   DIGITAL SCREENING BILATERAL MAMMOGRAM: In the left breast, a possible mass warrants further evaluation. In the right breast, no findings suspicious for malignancy.    10/08/2020 Mammogram   DIAGNOSTIC UNILATERAL LEFT MAMMOGRAM AND LEFT BREAST ULTRASOUND: There is architectural distortion at the left breast 12 o'clock.  Targeted ultrasound is performed, showing no focal abnormal discrete cystic or solid lesion at left breast 12 o'clock the correlate to the mammographic finding. Ultrasound of the left axilla is negative.   10/20/2020 Pathology Results   Breast, left, needle core biopsy, 12 o'clock:             Invasive mammary carcinoma  Mammary carcinoma in situ HER2: Negative (0) ER: Positive 30% PR: Negative 0% Ki67: 1%   10/26/2020 Initial Diagnosis   Malignant neoplasm of central portion of left breast in female, estrogen receptor positive ([place])   12/17/2020 Cancer Staging   Staging form: Breast, AJCC 8th Edition - Clinical stage from 12/17/2020: Stage IIA (cT1b, cN1(sn), cM0, G2, ER+, PR-, HER2-) - Signed by [name], MD on 02/13/2021 Histopathologic type: Infiltrating duct carcinoma, NOS Stage prefix: Initial diagnosis Method of lymph node assessment: Sentinel lymph node biopsy Nuclear grade: G2 Multigene prognostic tests performed: EndoPredict Histologic grading system: 3 grade system Laterality: Left Tumor size (mm): 7 Lymph-vascular invasion (LVI): LVI not present (absent)/not identified Diagnostic confirmation: Positive histology Specimen type: Excision Staged by: Managing physician Menopausal status: Postmenopausal Ki-67 (%): 1 EndoPredict EPclin risk score: 5.4 EndoPredict EPclin risk level: High risk EndoPredict 10-year percentage risk of distant recurrence (%): 55 EndoPredict 10-year risk of distant recurrence: High risk Stage used  in treatment planning: Yes National guidelines used in treatment planning: Yes Type of national guideline used in treatment planning: NCCN Staging comments: Endopredict with 27% benefit from chemotherapy   12/17/2020 Pathology Results   Breast, simple mastectomy, left:         Invasive ductal carcinoma, grade 2, 7 mm, and ductal carcinoma in situ         Ribbon clip and biopsy reaction present         All margins negative for invasive carcinoma and DCIS         Fibrocystic changes Lymph node, sentinel, biopsy, left axillary:         Tumor present in regional lymph node (1/1)   02/18/2021 -  Chemotherapy   Patient is on Treatment Plan : Breast AC q21 Days / PACLitaxel q7d      Genetic Testing   Negative genetic testing. No pathogenic variants identified on the [facility] Multi-Cancer+RNA panel. The report date is 03/08/2021.  The Multi-Cancer Panel + RNA offered by [facility] includes sequencing and/or deletion duplication testing of the following 84 genes: AIP, ALK, APC, ATM, AXIN2,BAP1,  BARD1, BLM, BMPR1A, BRCA1, BRCA2, BRIP1, CASR, CDC73, CDH1, CDK4, CDKN1B, CDKN1C, CDKN2A (p14ARF), CDKN2A (p16INK4a), CEBPA, CHEK2, CTNNA1, DICER1, DIS3L2, EGFR (c.2369C>T, p.Thr790Met variant only), EPCAM (Deletion/duplication testing only), FH, FLCN, GATA2, GPC3, GREM1 (Promoter region deletion/duplication testing only), HOXB13 (c.251G>A, p.Gly84Glu), HRAS, KIT, MAX, MEN1, MET, MITF (c.952G>A, p.Glu318Lys variant only), MLH1, MSH2, MSH3, MSH6, MUTYH, NBN, NF1, NF2, NTHL1, PALB2, PDGFRA, PHOX2B, PMS2, POLD1, POLE, POT1, PRKAR1A, PTCH1, PTEN, RAD50, RAD51C, RAD51D, RB1, RECQL4, RET, RUNX1, SDHAF2, SDHA (sequence changes only), SDHB, SDHC, SDHD, SMAD4, SMARCA4, SMARCB1, SMARCE1, STK11, SUFU, TERC, TERT, TMEM127, TP53, TSC1, TSC2, VHL, WRN and WT1.       REVIEW OF SYSTEMS:   Constitutional: Denies fevers, chills or abnormal weight loss Eyes: Denies blurriness of vision Ears, nose, mouth, throat, and face: Denies  mucositis or sore throat Respiratory: Denies cough, dyspnea or wheezes Cardiovascular: Denies palpitation, chest discomfort or lower extremity swelling Gastrointestinal:  Denies nausea, heartburn or change in bowel habits Skin: Denies abnormal skin rashes Lymphatics: Denies new lymphadenopathy or easy bruising Neurological:Denies numbness, tingling or new weaknesses Behavioral/Psych: Mood is stable, no new changes  All other systems were reviewed with the patient and are negative.   VITALS:  Blood pressure (!) 107/53, pulse (!) 104, temperature 97.8 F (36.6 C), temperature source Oral, resp. rate 18, height _0  (1.626 m), weight 189 lb 1.6 oz (85.8 kg), SpO2 95 %.  Wt  Readings from Last 3 Encounters:  08/01/21 189 lb 1.6 oz (85.8 kg)  07/25/21 181 lb 12.8 oz (82.5 kg)  07/19/21 188 lb 1.9 oz (85.3 kg)    Body mass index is 32.46 kg/m.  Performance status (ECOG): 1 - Symptomatic but completely ambulatory  PHYSICAL EXAM:   GENERAL:alert, no distress and comfortable SKIN: skin color, texture, turgor are normal, no rashes or significant lesions EYES: normal, Conjunctiva are pink and non-injected, sclera clear OROPHARYNX:no exudate, no erythema and lips, buccal mucosa, and tongue normal  NECK: supple, thyroid normal size, non-tender, without nodularity LYMPH:  no palpable lymphadenopathy in the cervical, axillary or inguinal LUNGS: clear to auscultation and percussion with normal breathing effort HEART: regular rate & rhythm and no murmurs and no lower extremity edema ABDOMEN:abdomen soft, non-tender and normal bowel sounds Musculoskeletal:no cyanosis of digits and no clubbing  NEURO: alert & oriented x 3 with fluent speech, no focal motor/sensory deficits  LABORATORY DATA:  I have reviewed the data as listed    Component Value Date/Time   NA 136 (A) 08/01/2021 0000   K 4.1 08/01/2021 0000   CL 104 08/01/2021 0000   CO2 26 (A) 08/01/2021 0000   BUN 12 08/01/2021 0000    CREATININE 0.8 08/01/2021 0000   CALCIUM 10.1 08/01/2021 0000   ALBUMIN 4.3 08/01/2021 0000   AST 45 (A) 08/01/2021 0000   ALT 42 (A) 08/01/2021 0000   ALKPHOS 58 08/01/2021 0000    No results found for: "SPEP", "UPEP"  Lab Results  Component Value Date   WBC 5.2 08/01/2021   NEUTROABS 2.96 08/01/2021   HGB 13.1 08/01/2021   HCT 39 08/01/2021   MCV 97 06/03/2021   PLT 285 08/01/2021      Chemistry      Component Value Date/Time   NA 136 (A) 08/01/2021 0000   K 4.1 08/01/2021 0000   CL 104 08/01/2021 0000   CO2 26 (A) 08/01/2021 0000   BUN 12 08/01/2021 0000   CREATININE 0.8 08/01/2021 0000   GLU 127 08/01/2021 0000      Component Value Date/Time   CALCIUM 10.1 08/01/2021 0000   ALKPHOS 58 08/01/2021 0000   AST 45 (A) 08/01/2021 0000   ALT 42 (A) 08/01/2021 0000       RADIOGRAPHIC STUDIES: I have personally reviewed the radiological images as listed and agreed with the findings in the report. No results found.

## 2021-08-02 ENCOUNTER — Other Ambulatory Visit: Payer: Self-pay | Admitting: Pharmacist

## 2021-08-02 ENCOUNTER — Other Ambulatory Visit: Payer: Self-pay

## 2021-08-02 ENCOUNTER — Ambulatory Visit: Payer: Self-pay

## 2021-08-02 ENCOUNTER — Other Ambulatory Visit: Payer: Self-pay | Admitting: Hematology and Oncology

## 2021-08-07 NOTE — Progress Notes (Signed)
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 She was treated with left mastectomy.  One node was positive for malignancy.  EndoPredict has revealed her to be at extremely high risk for recurrence, with an EP clin score of 5.4.  Her risk of recurrence in the next 10 years is 55% and she has an estimated chemotherapy benefit of 27%.  She completed 4 cycles of AC chemotherapy in May.  She is now receiving weekly paclitaxel. During cycle 9, she began to have seizure activity during her pre-meds and she was transported to the hospital via EMS. She was hospitalized, later transferred and treated for meningitis. She was discharged and seen on July 10 to restart therapy.  She remained fatigued and did not remember everything from her admission. She denied any residual symptoms.  She received a 9th cycle of paclitaxel on July 18.  She has increased general muscle weakness after receiving paclitaxel.  She has had a break for 2-3 weeks and comes in doing very well. CT was negative for any acute findings.   She is doing very well this week and ambulated into the clinic today.  She feels ready to resume the paclitaxel weekly so we will do so tomorrow. Neurology felt some of this was seizure activity and increased her Keppra dose to 750 mg BID.  We will continue to monitor her closely as she  starts back on treatment. The patient understands the plans discussed today and is in agreement with them.  She knows to contact our office if she develops concerns prior to her next appointment.  I provided 20 minutes of face-to-face time during this encounter and > 50% was spent counseling as documented under my assessment and plan.    [name], MD  [facility] [address] [place] [address] Dept: [phone] Dept Fax: [phone]     CHIEF COMPLAINT:  CC: Breast cancer  Current Treatment:  Weekly paclitaxel  INTERVAL HISTORY:  Brandy Maxwell is here today for repeat clinical assessment. She is feeling the best she has for a long time and was able to ambulate into the clinic. Her only complaint is persistent blurred vision and she knows she needs cataract surgery eventually. She is drinking 3 Ensure/week. She has lower back pain at 4/10. She reports no falls. CBC is normal. Non-fasting BS is 231 and her liver tests remain mildly abnormal but the rest of the CMP is normal.  She denies fevers or chills. She denies pain. Her appetite is good. Her weight has decreased by 1 pound over the last week.  I have reviewed the past medical history, past surgical history, social history and family history with the patient and they are unchanged from previous note.  ALLERGIES:  is allergic to nitrofurantoin and sulfa antibiotics.  MEDICATIONS:  Current Outpatient Medications  Medication Sig Dispense Refill   dexamethasone (DECADRON) 4 MG  tablet Take 3 tabs at the night before and 3 tab the morning of chemotherapy 60 tablet 0   dicyclomine (BENTYL) 10 MG capsule Take 10 mg by mouth 3 (three) times daily as needed for pain. With IBS     DULoxetine (CYMBALTA) 60 MG capsule Take 1 capsule by mouth at bedtime.     EPINEPHrine 0.3 mg/0.3 mL IJ SOAJ injection Inject 0.3 mg into the muscle as needed for anaphylaxis.     estradiol  (ESTRACE) 0.1 MG/GM vaginal cream Place 1 Applicatorful vaginally at bedtime. 42.5 g 0   ezetimibe (ZETIA) 10 MG tablet Take 10 mg by mouth daily.     fenofibrate 160 MG tablet Take 160 mg by mouth daily.     gabapentin (NEURONTIN) 600 MG tablet Take 1,200 mg by mouth 3 (three) times daily.     HYDROcodone-acetaminophen (NORCO/VICODIN) 5-325 MG tablet Take by mouth.     levETIRAcetam (KEPPRA) 750 MG tablet Take 750 mg by mouth 2 (two) times daily.     lidocaine (LIDODERM) 5 % Place 1 patch onto the skin as needed for pain.     lidocaine (XYLOCAINE) 2 % solution SMARTSIG:By Mouth     LINZESS 72 MCG capsule Take 1 capsule (72 mcg total) by mouth daily. 90 capsule 4   magic mouthwash (lidocaine, diphenhydrAMINE, alum & mag hydroxide) suspension Take 5 mLs by mouth every 3 (three) hours as needed. Swish and spit for mouth or throat pain     midodrine (PROAMATINE) 5 MG tablet Take 5 mg by mouth 3 (three) times daily with meals.     mirabegron ER (MYRBETRIQ) 50 MG TB24 tablet Take 1 tablet by mouth daily.     omeprazole (PRILOSEC OTC) 20 MG tablet Take 1 tablet by mouth daily.     ondansetron (ZOFRAN) 8 MG tablet Take 1 tablet (8 mg total) by mouth 2 (two) times daily as needed. Start on the third day after chemotherapy. 30 tablet 1   pramipexole (MIRAPEX) 1 MG tablet Take 1 mg by mouth at bedtime. Takes 1/2 tab     prochlorperazine (COMPAZINE) 10 MG tablet Take 1 tablet (10 mg total) by mouth every 6 (six) hours as needed (Nausea or vomiting). 30 tablet 1   ramelteon (ROZEREM) 8 MG tablet Take 8 mg by mouth at bedtime.     Vitamin D, Ergocalciferol, (DRISDOL) 1.25 MG (50000 UT) CAPS capsule TAKE 1 CAPSULE BY MOUTH ONCE A WEEK FOR 30 DAYS     No current facility-administered medications for this visit.    HISTORY OF PRESENT ILLNESS:   Oncology History  Malignant neoplasm of central portion of left breast in female, estrogen receptor positive ([place])  09/16/2020 Mammogram   DIGITAL SCREENING  BILATERAL MAMMOGRAM: In the left breast, a possible mass warrants further evaluation. In the right breast, no findings suspicious for malignancy.    10/08/2020 Mammogram   DIAGNOSTIC UNILATERAL LEFT MAMMOGRAM AND LEFT BREAST ULTRASOUND: There is architectural distortion at the left breast 12 o'clock.  Targeted ultrasound is performed, showing no focal abnormal discrete cystic or solid lesion at left breast 12 o'clock the correlate to the mammographic finding. Ultrasound of the left axilla is negative.   10/20/2020 Pathology Results   Breast, left, needle core biopsy, 12 o'clock:             Invasive mammary carcinoma             Mammary carcinoma in situ HER2: Negative (0) ER: Positive 30% PR: Negative 0% Ki67:  1%   10/26/2020 Initial Diagnosis   Malignant neoplasm of central portion of left breast in female, estrogen receptor positive ([place])   12/17/2020 Cancer Staging   Staging form: Breast, AJCC 8th Edition - Clinical stage from 12/17/2020: Stage IIA (cT1b, cN1(sn), cM0, G2, ER+, PR-, HER2-) - Signed by [name], MD on 02/13/2021 Histopathologic type: Infiltrating duct carcinoma, NOS Stage prefix: Initial diagnosis Method of lymph node assessment: Sentinel lymph node biopsy Nuclear grade: G2 Multigene prognostic tests performed: EndoPredict Histologic grading system: 3 grade system Laterality: Left Tumor size (mm): 7 Lymph-vascular invasion (LVI): LVI not present (absent)/not identified Diagnostic confirmation: Positive histology Specimen type: Excision Staged by: Managing physician Menopausal status: Postmenopausal Ki-67 (%): 1 EndoPredict EPclin risk score: 5.4 EndoPredict EPclin risk level: High risk EndoPredict 10-year percentage risk of distant recurrence (%): 55 EndoPredict 10-year risk of distant recurrence: High risk Stage used in treatment planning: Yes National guidelines used in treatment planning: Yes Type of national guideline used in treatment  planning: NCCN Staging comments: Endopredict with 27% benefit from chemotherapy   12/17/2020 Pathology Results   Breast, simple mastectomy, left:         Invasive ductal carcinoma, grade 2, 7 mm, and ductal carcinoma in situ         Ribbon clip and biopsy reaction present         All margins negative for invasive carcinoma and DCIS         Fibrocystic changes Lymph node, sentinel, biopsy, left axillary:         Tumor present in regional lymph node (1/1)   02/18/2021 -  Chemotherapy   Patient is on Treatment Plan : Breast AC q21 Days / PACLitaxel q7d      Genetic Testing   Negative genetic testing. No pathogenic variants identified on the [facility] Multi-Cancer+RNA panel. The report date is 03/08/2021.  The Multi-Cancer Panel + RNA offered by [facility] includes sequencing and/or deletion duplication testing of the following 84 genes: AIP, ALK, APC, ATM, AXIN2,BAP1,  BARD1, BLM, BMPR1A, BRCA1, BRCA2, BRIP1, CASR, CDC73, CDH1, CDK4, CDKN1B, CDKN1C, CDKN2A (p14ARF), CDKN2A (p16INK4a), CEBPA, CHEK2, CTNNA1, DICER1, DIS3L2, EGFR (c.2369C>T, p.Thr790Met variant only), EPCAM (Deletion/duplication testing only), FH, FLCN, GATA2, GPC3, GREM1 (Promoter region deletion/duplication testing only), HOXB13 (c.251G>A, p.Gly84Glu), HRAS, KIT, MAX, MEN1, MET, MITF (c.952G>A, p.Glu318Lys variant only), MLH1, MSH2, MSH3, MSH6, MUTYH, NBN, NF1, NF2, NTHL1, PALB2, PDGFRA, PHOX2B, PMS2, POLD1, POLE, POT1, PRKAR1A, PTCH1, PTEN, RAD50, RAD51C, RAD51D, RB1, RECQL4, RET, RUNX1, SDHAF2, SDHA (sequence changes only), SDHB, SDHC, SDHD, SMAD4, SMARCA4, SMARCB1, SMARCE1, STK11, SUFU, TERC, TERT, TMEM127, TP53, TSC1, TSC2, VHL, WRN and WT1.       REVIEW OF SYSTEMS:   Constitutional: Denies fevers, chills or abnormal weight loss Eyes: Persistent blurriness of vision Ears, nose, mouth, throat, and face: Denies mucositis or sore throat Respiratory: Denies cough, dyspnea or wheezes Cardiovascular: Denies palpitation, chest  discomfort or lower extremity swelling Gastrointestinal:  Denies nausea, heartburn or change in bowel habits Skin: Denies abnormal skin rashes Lymphatics: Denies new lymphadenopathy or easy bruising Neurological:Denies numbness, tingling or new weaknesses Musculoskeletal: Chronic lower back pain Behavioral/Psych: Mood is stable, no new changes  All other systems were reviewed with the patient and are negative.   VITALS:  Blood pressure 122/69, pulse (!) 107, temperature 97.9 F (36.6 C), temperature source Oral, resp. rate 17, height _0  (1.626 m), weight 188 lb 3.2 oz (85.4 kg), SpO2 94 %.  Wt Readings from Last 3 Encounters:  08/08/21 188 lb 3.2  oz (85.4 kg)  08/01/21 189 lb 1.6 oz (85.8 kg)  07/25/21 181 lb 12.8 oz (82.5 kg)    Body mass index is 32.3 kg/m.  Performance status (ECOG): 1 - Symptomatic but completely ambulatory  PHYSICAL EXAM:   GENERAL:alert, no distress and comfortable SKIN: skin color, texture, turgor are normal, no rashes or significant lesions EYES: normal, Conjunctiva are pink and non-injected, sclera clear OROPHARYNX:no exudate, no erythema and lips, buccal mucosa, and tongue normal  NECK: supple, thyroid normal size, non-tender, without nodularity LYMPH:  no palpable lymphadenopathy in the cervical, axillary or inguinal LUNGS: clear to auscultation and percussion with normal breathing effort HEART: regular rate & rhythm and no murmurs and no lower extremity edema ABDOMEN:abdomen soft, non-tender and normal bowel sounds Musculoskeletal:no cyanosis of digits and no clubbing  NEURO: alert & oriented x 3 with fluent speech, no focal motor/sensory deficits BREASTS: Right breast has no masses, left mastectomy site is negative  LABORATORY DATA:  I have reviewed the data as listed    Component Value Date/Time   NA 136 (A) 08/08/2021 0000   K 3.8 08/08/2021 0000   CL 102 08/08/2021 0000   CO2 25 (A) 08/08/2021 0000   BUN 15 08/08/2021 0000    CREATININE 0.8 08/08/2021 0000   CALCIUM 10.1 08/08/2021 0000   ALBUMIN 4.4 08/08/2021 0000   AST 42 (A) 08/08/2021 0000   ALT 39 (A) 08/08/2021 0000   ALKPHOS 54 08/08/2021 0000    No results found for: "SPEP", "UPEP"  Lab Results  Component Value Date   WBC 4.7 08/08/2021   NEUTROABS 2.59 08/08/2021   HGB 13.5 08/08/2021   HCT 39 08/08/2021   MCV 97 06/03/2021   PLT 272 08/08/2021      Chemistry      Component Value Date/Time   NA 136 (A) 08/08/2021 0000   K 3.8 08/08/2021 0000   CL 102 08/08/2021 0000   CO2 25 (A) 08/08/2021 0000   BUN 15 08/08/2021 0000   CREATININE 0.8 08/08/2021 0000   GLU 231 08/08/2021 0000      Component Value Date/Time   CALCIUM 10.1 08/08/2021 0000   ALKPHOS 54 08/08/2021 0000   AST 42 (A) 08/08/2021 0000   ALT 39 (A) 08/08/2021 0000       RADIOGRAPHIC STUDIES: No results found.

## 2021-08-08 ENCOUNTER — Encounter: Payer: Self-pay | Admitting: Oncology

## 2021-08-08 ENCOUNTER — Encounter: Payer: Self-pay | Admitting: Hematology and Oncology

## 2021-08-08 ENCOUNTER — Other Ambulatory Visit: Payer: Self-pay | Admitting: Oncology

## 2021-08-08 ENCOUNTER — Other Ambulatory Visit: Payer: Self-pay | Admitting: Pharmacist

## 2021-08-08 ENCOUNTER — Inpatient Hospital Stay (INDEPENDENT_AMBULATORY_CARE_PROVIDER_SITE_OTHER): Payer: Medicare Other | Admitting: Oncology

## 2021-08-08 ENCOUNTER — Inpatient Hospital Stay: Payer: Medicare Other | Attending: Oncology

## 2021-08-08 VITALS — BP 122/69 | HR 107 | Temp 97.9°F | Resp 17 | Ht 64.0 in | Wt 188.2 lb

## 2021-08-08 DIAGNOSIS — Z79899 Other long term (current) drug therapy: Secondary | ICD-10-CM | POA: Insufficient documentation

## 2021-08-08 DIAGNOSIS — C773 Secondary and unspecified malignant neoplasm of axilla and upper limb lymph nodes: Secondary | ICD-10-CM | POA: Diagnosis not present

## 2021-08-08 DIAGNOSIS — Z5111 Encounter for antineoplastic chemotherapy: Secondary | ICD-10-CM | POA: Insufficient documentation

## 2021-08-08 DIAGNOSIS — Z170 Estrogen receptor positive status [ER+]: Secondary | ICD-10-CM

## 2021-08-08 DIAGNOSIS — C50112 Malignant neoplasm of central portion of left female breast: Secondary | ICD-10-CM | POA: Insufficient documentation

## 2021-08-08 DIAGNOSIS — I959 Hypotension, unspecified: Secondary | ICD-10-CM | POA: Insufficient documentation

## 2021-08-08 LAB — HEPATIC FUNCTION PANEL
ALT: 39 U/L — AB (ref 7–35)
AST: 42 — AB (ref 13–35)
Alkaline Phosphatase: 54 (ref 25–125)
Bilirubin, Total: 0.7

## 2021-08-08 LAB — CBC AND DIFFERENTIAL
HCT: 39 (ref 36–46)
Hemoglobin: 13.5 (ref 12.0–16.0)
Neutrophils Absolute: 2.59
Platelets: 272 10*3/uL (ref 150–400)
WBC: 4.7

## 2021-08-08 LAB — BASIC METABOLIC PANEL
BUN: 15 (ref 4–21)
CO2: 25 — AB (ref 13–22)
Chloride: 102 (ref 99–108)
Creatinine: 0.8 (ref 0.5–1.1)
Glucose: 231
Potassium: 3.8 mEq/L (ref 3.5–5.1)
Sodium: 136 — AB (ref 137–147)

## 2021-08-08 LAB — COMPREHENSIVE METABOLIC PANEL
Albumin: 4.4 (ref 3.5–5.0)
Calcium: 10.1 (ref 8.7–10.7)

## 2021-08-08 LAB — CBC: RBC: 4.05 (ref 3.87–5.11)

## 2021-08-08 MED FILL — Dexamethasone Sodium Phosphate Inj 100 MG/10ML: INTRAMUSCULAR | Qty: 1 | Status: AC

## 2021-08-08 NOTE — Assessment & Plan Note (Signed)
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 Shewas treated withleft mastectomy. One node was positive for malignancy. EndoPredict has revealed her to be at extremely high riskfor recurrence,with an EP clin score of 5.4.Her risk of recurrence in the next 10 years is 55% and she has an estimated chemotherapy benefit of 27%. She completed 4 cycles of AC chemotherapy in May. She is now receiving weekly paclitaxel. During cycle 9, she began to have seizure activity during her pre-meds and she was transported to the hospital via EMS. She was hospitalized, later transferred and treated for meningitis. She was discharged and seen on July 10 to restart therapy.  She remained fatigued and did not remember everything from her admission. She denied any residual symptoms.  She received a 9th cycle of paclitaxel on July 18.  She has increased general muscle weakness since receiving paclitaxel.  She still described episodes of shakiness. She was sent for CT of the head due to some noted confusion. This was negative. Today, she reports feeling much better. She did have changes made to her medications by neuro and denies any further incident of shakiness. We decided to give her one more week without treatment to observe off of treatment. She did ask that no further information go through her husband as he has difficulty communicating information. We will see her back in one week for repeat evaluation and possible restarting chemotherapy.

## 2021-08-09 ENCOUNTER — Inpatient Hospital Stay: Payer: Medicare Other

## 2021-08-09 ENCOUNTER — Telehealth: Payer: Self-pay

## 2021-08-09 VITALS — BP 122/74 | HR 100 | Temp 97.8°F | Resp 18 | Ht 64.0 in | Wt 188.0 lb

## 2021-08-09 DIAGNOSIS — Z79899 Other long term (current) drug therapy: Secondary | ICD-10-CM | POA: Diagnosis not present

## 2021-08-09 DIAGNOSIS — I959 Hypotension, unspecified: Secondary | ICD-10-CM | POA: Diagnosis not present

## 2021-08-09 DIAGNOSIS — Z170 Estrogen receptor positive status [ER+]: Secondary | ICD-10-CM

## 2021-08-09 DIAGNOSIS — C50112 Malignant neoplasm of central portion of left female breast: Secondary | ICD-10-CM | POA: Diagnosis present

## 2021-08-09 DIAGNOSIS — Z5111 Encounter for antineoplastic chemotherapy: Secondary | ICD-10-CM | POA: Diagnosis not present

## 2021-08-09 MED ORDER — SODIUM CHLORIDE 0.9% FLUSH
10.0000 mL | INTRAVENOUS | Status: DC | PRN
  Administered 2021-08-09: 10 mL

## 2021-08-09 MED ORDER — HEPARIN SOD (PORK) LOCK FLUSH 100 UNIT/ML IV SOLN
500.0000 [IU] | Freq: Once | INTRAVENOUS | Status: AC | PRN
  Administered 2021-08-09: 500 [IU]

## 2021-08-09 MED ORDER — FAMOTIDINE IN NACL 20-0.9 MG/50ML-% IV SOLN
20.0000 mg | Freq: Once | INTRAVENOUS | Status: AC
  Administered 2021-08-09: 20 mg via INTRAVENOUS
  Filled 2021-08-09: qty 50

## 2021-08-09 MED ORDER — SODIUM CHLORIDE 0.9 % IV SOLN
80.0000 mg/m2 | Freq: Once | INTRAVENOUS | Status: AC
  Administered 2021-08-09: 162 mg via INTRAVENOUS
  Filled 2021-08-09: qty 27

## 2021-08-09 MED ORDER — DIPHENHYDRAMINE HCL 50 MG/ML IJ SOLN
25.0000 mg | Freq: Once | INTRAMUSCULAR | Status: AC
  Administered 2021-08-09: 25 mg via INTRAVENOUS
  Filled 2021-08-09: qty 1

## 2021-08-09 MED ORDER — SODIUM CHLORIDE 0.9 % IV SOLN
10.0000 mg | Freq: Once | INTRAVENOUS | Status: AC
  Administered 2021-08-09: 10 mg via INTRAVENOUS
  Filled 2021-08-09: qty 10

## 2021-08-09 MED ORDER — SODIUM CHLORIDE 0.9 % IV SOLN: Freq: Once | INTRAVENOUS | Status: AC

## 2021-08-09 NOTE — Patient Instructions (Signed)
Brandy Maxwell  Discharge Instructions: Thank you for choosing [facility] to provide your oncology and hematology care.  If you have a lab appointment with the [facility], please go directly to the [facility] and check in at the registration area.   Wear comfortable clothing and clothing appropriate for easy access to any Portacath or PICC line.   We strive to give you quality time with your provider. You may need to reschedule your appointment if you arrive late (15 or more minutes).  Arriving late affects you and other patients whose appointments are after yours.  Also, if you miss three or more appointments without notifying the office, you may be dismissed from the clinic at the provider's discretion.      For prescription refill requests, have your pharmacy contact our office and allow 72 hours for refills to be completed.    Today you received the following chemotherapy and/or immunotherapy agents    To help prevent nausea and vomiting after your treatment, we encourage you to take your nausea medication as directed.  BELOW ARE SYMPTOMS THAT SHOULD BE REPORTED IMMEDIATELY: *FEVER GREATER THAN 100.4 F (38 C) OR HIGHER *CHILLS OR SWEATING *NAUSEA AND VOMITING THAT IS NOT CONTROLLED WITH YOUR NAUSEA MEDICATION *UNUSUAL SHORTNESS OF BREATH *UNUSUAL BRUISING OR BLEEDING *URINARY PROBLEMS (pain or burning when urinating, or frequent urination) *BOWEL PROBLEMS (unusual diarrhea, constipation, pain near the anus) TENDERNESS IN MOUTH AND THROAT WITH OR WITHOUT PRESENCE OF ULCERS (sore throat, sores in mouth, or a toothache) UNUSUAL RASH, SWELLING OR PAIN  UNUSUAL VAGINAL DISCHARGE OR ITCHING   Items with * indicate a potential emergency and should be followed up as soon as possible or go to the Emergency Department if any problems should occur.  Please show the CHEMOTHERAPY ALERT CARD or IMMUNOTHERAPY ALERT CARD at check-in to the Emergency  Department and triage nurse.  Should you have questions after your visit or need to cancel or reschedule your appointment, please contact [facility]  Dept: [phone]  and follow the prompts.  Office hours are 8:00 a.m. to 4:30 p.m. Monday - Friday. Please note that voicemails left after 4:00 p.m. may not be returned until the following business day.  We are closed weekends and major holidays. You have access to a nurse at all times for urgent questions. Please call the main number to the clinic Dept: [phone] and follow the prompts.  For any non-urgent questions, you may also contact your provider using MyChart. We now offer e-Visits for anyone 71 and older to request care online for non-urgent symptoms. For details visit [email or web address].   Also download the MyChart app! Go to the app store, search "MyChart", open the app, select [facility], and log in with your MyChart username and password.  Masks are optional in the cancer centers. If you would like for your care team to wear a mask while they are taking care of you, please let them know. You may have one support person who is at least 65 years old accompany you for your appointments. Paclitaxel Injection What is this medication? PACLITAXEL (PAK li TAX el) treats some types of cancer. It works by slowing down the growth of cancer cells. This medicine may be used for other purposes; ask your health care provider or pharmacist if you have questions. COMMON BRAND NAME(S): Onxol, Taxol What should I tell my care team before I take this medication? They need to know if  you have any of these conditions: Heart disease Liver disease Low white blood cell levels An unusual or allergic reaction to paclitaxel, other medications, foods, dyes, or preservatives If you or your partner are pregnant or trying to get pregnant Breast-feeding How should I use this medication? This medication is injected into a vein. It  is given by your care team in a hospital or clinic setting. Talk to your care team about the use of this medication in children. While it may be given to children for selected conditions, precautions do apply. Overdosage: If you think you have taken too much of this medicine contact a poison control center or emergency room at once. NOTE: This medicine is only for you. Do not share this medicine with others. What if I miss a dose? Keep appointments for follow-up doses. It is important not to miss your dose. Call your care team if you are unable to keep an appointment. What may interact with this medication? Do not take this medication with any of the following: Live virus vaccines Other medications may affect the way this medication works. Talk with your care team about all of the medications you take. They may suggest changes to your treatment plan to lower the risk of side effects and to make sure your medications work as intended. This list may not describe all possible interactions. Give your health care provider a list of all the medicines, herbs, non-prescription drugs, or dietary supplements you use. Also tell them if you smoke, drink alcohol, or use illegal drugs. Some items may interact with your medicine. What should I watch for while using this medication? Your condition will be monitored carefully while you are receiving this medication. You may need blood work while taking this medication. This medication may make you feel generally unwell. This is not uncommon as chemotherapy can affect healthy cells as well as cancer cells. Report any side effects. Continue your course of treatment even though you feel ill unless your care team tells you to stop. This medication can cause serious allergic reactions. To reduce the risk, your care team may give you other medications to take before receiving this one. Be sure to follow the directions from your care team. This medication may increase your  risk of getting an infection. Call your care team for advice if you get a fever, chills, sore throat, or other symptoms of a cold or flu. Do not treat yourself. Try to avoid being around people who are sick. This medication may increase your risk to bruise or bleed. Call your care team if you notice any unusual bleeding. Be careful brushing or flossing your teeth or using a toothpick because you may get an infection or bleed more easily. If you have any dental work done, tell your dentist you are receiving this medication. Talk to your care team if you may be pregnant. Serious birth defects can occur if you take this medication during pregnancy. Talk to your care team before breastfeeding. Changes to your treatment plan may be needed. What side effects may I notice from receiving this medication? Side effects that you should report to your care team as soon as possible: Allergic reactions--skin rash, itching, hives, swelling of the face, lips, tongue, or throat Heart rhythm changes--fast or irregular heartbeat, dizziness, feeling faint or lightheaded, chest pain, trouble breathing Increase in blood pressure Infection--fever, chills, cough, sore throat, wounds that don't heal, pain or trouble when passing urine, general feeling of discomfort or being unwell Low blood  pressure--dizziness, feeling faint or lightheaded, blurry vision Low red blood cell level--unusual weakness or fatigue, dizziness, headache, trouble breathing Painful swelling, warmth, or redness of the skin, blisters or sores at the infusion site Pain, tingling, or numbness in the hands or feet Slow heartbeat--dizziness, feeling faint or lightheaded, confusion, trouble breathing, unusual weakness or fatigue Unusual bruising or bleeding Side effects that usually do not require medical attention (report to your care team if they continue or are bothersome): Diarrhea Hair loss Joint pain Loss of appetite Muscle  pain Nausea Vomiting This list may not describe all possible side effects. Call your doctor for medical advice about side effects. You may report side effects to FDA at 1-800-FDA-1088. Where should I keep my medication? This medication is given in a hospital or clinic. It will not be stored at home. NOTE: This sheet is a summary. It may not cover all possible information. If you have questions about this medicine, talk to your doctor, pharmacist, or health care provider.  2023 Elsevier/Gold Standard (2021-05-05 00:00:00)

## 2021-08-09 NOTE — Telephone Encounter (Addendum)
According to scheduling notes,Patient was scheduled for the DEXA scan at [facility]. [facility] to have them fax [place] a copy of the DEXA scan.

## 2021-08-09 NOTE — Telephone Encounter (Signed)
-----   Message from [name], MD sent at 08/07/2021  2:20 PM EDT ----- Regarding: DEXA Did she ever have DEXA?

## 2021-08-15 ENCOUNTER — Inpatient Hospital Stay (INDEPENDENT_AMBULATORY_CARE_PROVIDER_SITE_OTHER): Payer: Medicare Other | Admitting: Hematology and Oncology

## 2021-08-15 ENCOUNTER — Inpatient Hospital Stay: Payer: Medicare Other

## 2021-08-15 ENCOUNTER — Encounter: Payer: Self-pay | Admitting: Hematology and Oncology

## 2021-08-15 DIAGNOSIS — Z170 Estrogen receptor positive status [ER+]: Secondary | ICD-10-CM | POA: Diagnosis not present

## 2021-08-15 DIAGNOSIS — C50112 Malignant neoplasm of central portion of left female breast: Secondary | ICD-10-CM | POA: Diagnosis not present

## 2021-08-15 LAB — HEPATIC FUNCTION PANEL
ALT: 33 U/L (ref 7–35)
AST: 34 (ref 13–35)
Alkaline Phosphatase: 52 (ref 25–125)
Bilirubin, Total: 0.8

## 2021-08-15 LAB — BASIC METABOLIC PANEL
BUN: 16 (ref 4–21)
CO2: 24 — AB (ref 13–22)
Chloride: 103 (ref 99–108)
Creatinine: 0.7 (ref 0.5–1.1)
Glucose: 197
Potassium: 4.1 mEq/L (ref 3.5–5.1)
Sodium: 136 — AB (ref 137–147)

## 2021-08-15 LAB — COMPREHENSIVE METABOLIC PANEL
Albumin: 4.4 (ref 3.5–5.0)
Calcium: 10.8 — AB (ref 8.7–10.7)

## 2021-08-15 LAB — CBC AND DIFFERENTIAL
HCT: 38 (ref 36–46)
Hemoglobin: 13.3 (ref 12.0–16.0)
Neutrophils Absolute: 2.26
Platelets: 266 10*3/uL (ref 150–400)
WBC: 4.1

## 2021-08-15 LAB — CBC: RBC: 4.05 (ref 3.87–5.11)

## 2021-08-15 MED FILL — Dexamethasone Sodium Phosphate Inj 100 MG/10ML: INTRAMUSCULAR | Qty: 1 | Status: AC

## 2021-08-15 MED FILL — Paclitaxel IV Conc 300 MG/50ML (6 MG/ML): INTRAVENOUS | Qty: 27 | Status: AC

## 2021-08-15 NOTE — Progress Notes (Signed)
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 She was treated with left mastectomy.  One node was positive for malignancy.  EndoPredict has revealed her to be at extremely high risk for recurrence, with an EP clin score of 5.4.  Her risk of recurrence in the next 10 years is 55% and she has an estimated chemotherapy benefit of 27%.  She completed 4 cycles of AC chemotherapy in May.  She is now receiving weekly paclitaxel. During cycle 9, she began to have seizure activity during her pre-meds and she was transported to the hospital via EMS. She was hospitalized, later transferred and treated for meningitis. She was discharged and seen on July 10 to restart therapy.  She remained fatigued and did not remember everything from her admission. She denied any residual symptoms.  She received a 9th cycle of paclitaxel on July 18.  She has increased general muscle weakness after receiving paclitaxel.  She had a break for 2-3 weeks. She is due cycle 11 this week. She reports 2-3 days of severe diarrhea and abdominal cramping after this most recent cycle, but feels she may have had a GI bug. She is still a little tired, but is wanting to proceed with treatment. We will plan for extra IVF tomorrow and possible potassium. She will return to clinic in one week for repeat evaluation.    The  patient understands the plans discussed today and is in agreement with them.  She knows to contact our office if she develops concerns prior to her next appointment.    Melodye Ped, NP  [facility] [address] [place] [address] Dept: [phone] Dept Fax: [phone]   Orders Placed This Encounter  Procedures   Basic metabolic panel    This external order was created through the Results Console.   Comprehensive metabolic panel    This external order was created through the Results Console.   Hepatic function panel    This external order was created through the Results Console.   CBC and differential    This external order was created through the Results Console.   CBC    This external order was created through the Results Console.      CHIEF COMPLAINT:  CC: A 66 year old female with history of breast cancer here for weekly evaluation  Current Treatment:  Paclitaxel weekly  INTERVAL HISTORY:  Brandy Maxwell is here today for repeat clinical assessment. She denies fevers or chills. She denies pain. Her appetite is good. Her weight has been stable.  I have reviewed the past medical history, past surgical history, social history and family history with the patient and they are unchanged from previous note.  ALLERGIES:  is allergic to nitrofurantoin and sulfa antibiotics.  MEDICATIONS:  Current Outpatient Medications  Medication Sig Dispense Refill   vitamin E 1000 UNIT capsule Take 1,000 Units by mouth daily.  zinc gluconate 50 MG tablet Take 50 mg by mouth daily.     dexamethasone (DECADRON) 4 MG tablet Take 3 tabs at the night before and 3 tab the morning of chemotherapy 60 tablet 0   dicyclomine (BENTYL) 10 MG capsule Take 10 mg by mouth 3 (three) times daily as needed for pain. With IBS     DULoxetine (CYMBALTA) 60 MG capsule Take 1 capsule by mouth at bedtime.     EPINEPHrine 0.3 mg/0.3 mL IJ SOAJ  injection Inject 0.3 mg into the muscle as needed for anaphylaxis.     estradiol (ESTRACE) 0.1 MG/GM vaginal cream Place 1 Applicatorful vaginally at bedtime. 42.5 g 0   ezetimibe (ZETIA) 10 MG tablet Take 10 mg by mouth daily.     fenofibrate 160 MG tablet Take 160 mg by mouth daily.     gabapentin (NEURONTIN) 600 MG tablet Take 1,200 mg by mouth 3 (three) times daily.     HYDROcodone-acetaminophen (NORCO/VICODIN) 5-325 MG tablet Take by mouth.     levETIRAcetam (KEPPRA) 750 MG tablet Take 750 mg by mouth 2 (two) times daily.     lidocaine (LIDODERM) 5 % Place 1 patch onto the skin as needed for pain.     lidocaine (XYLOCAINE) 2 % solution SMARTSIG:By Mouth     LINZESS 72 MCG capsule Take 1 capsule (72 mcg total) by mouth daily. 90 capsule 4   magic mouthwash (lidocaine, diphenhydrAMINE, alum & mag hydroxide) suspension Take 5 mLs by mouth every 3 (three) hours as needed. Swish and spit for mouth or throat pain     metoprolol succinate (TOPROL-XL) 25 MG 24 hr tablet Take 25 mg by mouth daily.     midodrine (PROAMATINE) 5 MG tablet Take 5 mg by mouth 3 (three) times daily with meals.     mirabegron ER (MYRBETRIQ) 50 MG TB24 tablet Take 1 tablet by mouth daily.     omeprazole (PRILOSEC OTC) 20 MG tablet Take 1 tablet by mouth daily.     ondansetron (ZOFRAN) 8 MG tablet Take 1 tablet (8 mg total) by mouth 2 (two) times daily as needed. Start on the third day after chemotherapy. 30 tablet 1   pramipexole (MIRAPEX) 1 MG tablet Take 1 mg by mouth at bedtime. Takes 1/2 tab     prochlorperazine (COMPAZINE) 10 MG tablet Take 1 tablet (10 mg total) by mouth every 6 (six) hours as needed (Nausea or vomiting). 30 tablet 1   ramelteon (ROZEREM) 8 MG tablet Take 8 mg by mouth at bedtime.     Vitamin D, Ergocalciferol, (DRISDOL) 1.25 MG (50000 UT) CAPS capsule TAKE 1 CAPSULE BY MOUTH ONCE A WEEK FOR 30 DAYS     No current facility-administered medications for this visit.    HISTORY OF PRESENT ILLNESS:    Oncology History  Malignant neoplasm of central portion of left breast in female, estrogen receptor positive ([place])  09/16/2020 Mammogram   DIGITAL SCREENING BILATERAL MAMMOGRAM: In the left breast, a possible mass warrants further evaluation. In the right breast, no findings suspicious for malignancy.    10/08/2020 Mammogram   DIAGNOSTIC UNILATERAL LEFT MAMMOGRAM AND LEFT BREAST ULTRASOUND: There is architectural distortion at the left breast 12 o'clock.  Targeted ultrasound is performed, showing no focal abnormal discrete cystic or solid lesion at left breast 12 o'clock the correlate to the mammographic finding. Ultrasound of the left axilla is negative.   10/20/2020 Pathology Results   Breast, left, needle core biopsy, 12 o'clock:  Invasive mammary carcinoma             Mammary carcinoma in situ HER2: Negative (0) ER: Positive 30% PR: Negative 0% Ki67: 1%   10/26/2020 Initial Diagnosis   Malignant neoplasm of central portion of left breast in female, estrogen receptor positive ([place])   12/17/2020 Cancer Staging   Staging form: Breast, AJCC 8th Edition - Clinical stage from 12/17/2020: Stage IIA (cT1b, cN1(sn), cM0, G2, ER+, PR-, HER2-) - Signed by [name], MD on 02/13/2021 Histopathologic type: Infiltrating duct carcinoma, NOS Stage prefix: Initial diagnosis Method of lymph node assessment: Sentinel lymph node biopsy Nuclear grade: G2 Multigene prognostic tests performed: EndoPredict Histologic grading system: 3 grade system Laterality: Left Tumor size (mm): 7 Lymph-vascular invasion (LVI): LVI not present (absent)/not identified Diagnostic confirmation: Positive histology Specimen type: Excision Staged by: Managing physician Menopausal status: Postmenopausal Ki-67 (%): 1 EndoPredict EPclin risk score: 5.4 EndoPredict EPclin risk level: High risk EndoPredict 10-year percentage risk of distant recurrence (%): 55 EndoPredict 10-year risk of distant  recurrence: High risk Stage used in treatment planning: Yes National guidelines used in treatment planning: Yes Type of national guideline used in treatment planning: NCCN Staging comments: Endopredict with 27% benefit from chemotherapy   12/17/2020 Pathology Results   Breast, simple mastectomy, left:         Invasive ductal carcinoma, grade 2, 7 mm, and ductal carcinoma in situ         Ribbon clip and biopsy reaction present         All margins negative for invasive carcinoma and DCIS         Fibrocystic changes Lymph node, sentinel, biopsy, left axillary:         Tumor present in regional lymph node (1/1)   02/18/2021 -  Chemotherapy   Patient is on Treatment Plan : Breast AC q21 Days / PACLitaxel q7d      Genetic Testing   Negative genetic testing. No pathogenic variants identified on the [facility] Multi-Cancer+RNA panel. The report date is 03/08/2021.  The Multi-Cancer Panel + RNA offered by [facility] includes sequencing and/or deletion duplication testing of the following 84 genes: AIP, ALK, APC, ATM, AXIN2,BAP1,  BARD1, BLM, BMPR1A, BRCA1, BRCA2, BRIP1, CASR, CDC73, CDH1, CDK4, CDKN1B, CDKN1C, CDKN2A (p14ARF), CDKN2A (p16INK4a), CEBPA, CHEK2, CTNNA1, DICER1, DIS3L2, EGFR (c.2369C>T, p.Thr790Met variant only), EPCAM (Deletion/duplication testing only), FH, FLCN, GATA2, GPC3, GREM1 (Promoter region deletion/duplication testing only), HOXB13 (c.251G>A, p.Gly84Glu), HRAS, KIT, MAX, MEN1, MET, MITF (c.952G>A, p.Glu318Lys variant only), MLH1, MSH2, MSH3, MSH6, MUTYH, NBN, NF1, NF2, NTHL1, PALB2, PDGFRA, PHOX2B, PMS2, POLD1, POLE, POT1, PRKAR1A, PTCH1, PTEN, RAD50, RAD51C, RAD51D, RB1, RECQL4, RET, RUNX1, SDHAF2, SDHA (sequence changes only), SDHB, SDHC, SDHD, SMAD4, SMARCA4, SMARCB1, SMARCE1, STK11, SUFU, TERC, TERT, TMEM127, TP53, TSC1, TSC2, VHL, WRN and WT1.       REVIEW OF SYSTEMS:   Constitutional: Denies fevers, chills or abnormal weight loss Eyes: Denies blurriness of vision Ears, nose,  mouth, throat, and face: Denies mucositis or sore throat Respiratory: Denies cough, dyspnea or wheezes Cardiovascular: Denies palpitation, chest discomfort or lower extremity swelling Gastrointestinal:  Denies nausea, heartburn or change in bowel habits Skin: Denies abnormal skin rashes Lymphatics: Denies new lymphadenopathy or easy bruising Neurological:Denies numbness, tingling or new weaknesses Behavioral/Psych: Mood is stable, no new changes  All other systems were reviewed with the patient and are negative.   VITALS:  Blood pressure (!) 119/56, pulse (!) 106, temperature 97.7 F (36.5 C), temperature source Oral, resp. rate 20, height 5'  4" (1.626 m), weight 185 lb 8 oz (84.1 kg), SpO2 94 %.  Wt Readings from Last 3 Encounters:  08/15/21 185 lb 8 oz (84.1 kg)  08/09/21 188 lb (85.3 kg)  08/08/21 188 lb 3.2 oz (85.4 kg)    Body mass index is 31.84 kg/m.  Performance status (ECOG): 1 - Symptomatic but completely ambulatory  PHYSICAL EXAM:   GENERAL:alert, no distress and comfortable SKIN: skin color, texture, turgor are normal, no rashes or significant lesions EYES: normal, Conjunctiva are pink and non-injected, sclera clear OROPHARYNX:no exudate, no erythema and lips, buccal mucosa, and tongue normal  NECK: supple, thyroid normal size, non-tender, without nodularity LYMPH:  no palpable lymphadenopathy in the cervical, axillary or inguinal LUNGS: clear to auscultation and percussion with normal breathing effort HEART: regular rate & rhythm and no murmurs and no lower extremity edema ABDOMEN:abdomen soft, non-tender and normal bowel sounds Musculoskeletal:no cyanosis of digits and no clubbing  NEURO: alert & oriented x 3 with fluent speech, no focal motor/sensory deficits  LABORATORY DATA:  I have reviewed the data as listed    Component Value Date/Time   NA 136 (A) 08/15/2021 0000   K 4.1 08/15/2021 0000   CL 103 08/15/2021 0000   CO2 24 (A) 08/15/2021 0000   BUN 16  08/15/2021 0000   CREATININE 0.7 08/15/2021 0000   CALCIUM 10.8 (A) 08/15/2021 0000   ALBUMIN 4.4 08/15/2021 0000   AST 34 08/15/2021 0000   ALT 33 08/15/2021 0000   ALKPHOS 52 08/15/2021 0000    No results found for: "SPEP", "UPEP"  Lab Results  Component Value Date   WBC 4.1 08/15/2021   NEUTROABS 2.26 08/15/2021   HGB 13.3 08/15/2021   HCT 38 08/15/2021   MCV 97 06/03/2021   PLT 266 08/15/2021      Chemistry      Component Value Date/Time   NA 136 (A) 08/15/2021 0000   K 4.1 08/15/2021 0000   CL 103 08/15/2021 0000   CO2 24 (A) 08/15/2021 0000   BUN 16 08/15/2021 0000   CREATININE 0.7 08/15/2021 0000   GLU 197 08/15/2021 0000      Component Value Date/Time   CALCIUM 10.8 (A) 08/15/2021 0000   ALKPHOS 52 08/15/2021 0000   AST 34 08/15/2021 0000   ALT 33 08/15/2021 0000       RADIOGRAPHIC STUDIES: I have personally reviewed the radiological images as listed and agreed with the findings in the report. No results found.

## 2021-08-15 NOTE — Assessment & Plan Note (Addendum)
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 Shewas treated withleft mastectomy. One node was positive for malignancy. EndoPredict has revealed her to beatextremely high riskfor recurrence,with an EP clin score of 5.4.Her risk of recurrence in the next 10 years is 55% and she has an estimated chemotherapy benefit of 27%. She completed 4 cycles of AC chemotherapyin May. She is now receiving weekly paclitaxel. During cycle 9, she began to have seizure activity during her pre-meds and she was transported to the hospital via EMS. She was hospitalized, later transferred and treated for meningitis. Shewasdischarged and seen onJuly 10 to restart therapy. Sheremained fatigued and did notremember everything from her admission. She deniedany residual symptoms. She received a9thcycleof paclitaxelon July 18.She has increased general muscle weakness after receiving paclitaxel.She had a break for 2-3 weeks.She is due cycle 11 this week. She reports 2-3 days of severe diarrhea and abdominal cramping after this most recent cycle, but feels she may have had a GI bug. She is still a little tired, but is wanting to proceed with treatment. We will plan for extra IVF tomorrow and possible potassium. She will return to clinic in one week for repeat evaluation.

## 2021-08-16 ENCOUNTER — Inpatient Hospital Stay: Payer: Medicare Other

## 2021-08-16 ENCOUNTER — Encounter: Payer: Self-pay | Admitting: Oncology

## 2021-08-16 VITALS — BP 142/67 | HR 93 | Temp 97.4°F | Resp 16 | Ht 64.0 in | Wt 188.0 lb

## 2021-08-16 DIAGNOSIS — Z5111 Encounter for antineoplastic chemotherapy: Secondary | ICD-10-CM | POA: Diagnosis not present

## 2021-08-16 DIAGNOSIS — C50112 Malignant neoplasm of central portion of left female breast: Secondary | ICD-10-CM

## 2021-08-16 MED ORDER — SODIUM CHLORIDE 0.9 % IV SOLN: Freq: Once | INTRAVENOUS | Status: AC

## 2021-08-16 MED ORDER — HEPARIN SOD (PORK) LOCK FLUSH 100 UNIT/ML IV SOLN
500.0000 [IU] | Freq: Once | INTRAVENOUS | Status: AC | PRN
  Administered 2021-08-16: 500 [IU]

## 2021-08-16 MED ORDER — SODIUM CHLORIDE 0.9 % IV SOLN
10.0000 mg | Freq: Once | INTRAVENOUS | Status: AC
  Administered 2021-08-16: 10 mg via INTRAVENOUS
  Filled 2021-08-16: qty 10

## 2021-08-16 MED ORDER — SODIUM CHLORIDE 0.9% FLUSH
10.0000 mL | INTRAVENOUS | Status: DC | PRN
  Administered 2021-08-16: 10 mL

## 2021-08-16 MED ORDER — SODIUM CHLORIDE 0.9 % IV SOLN
80.0000 mg/m2 | Freq: Once | INTRAVENOUS | Status: AC
  Administered 2021-08-16: 162 mg via INTRAVENOUS
  Filled 2021-08-16: qty 27

## 2021-08-16 MED ORDER — FAMOTIDINE IN NACL 20-0.9 MG/50ML-% IV SOLN
20.0000 mg | Freq: Once | INTRAVENOUS | Status: AC
  Administered 2021-08-16: 20 mg via INTRAVENOUS

## 2021-08-16 MED ORDER — DIPHENHYDRAMINE HCL 50 MG/ML IJ SOLN
25.0000 mg | Freq: Once | INTRAMUSCULAR | Status: AC
  Administered 2021-08-16: 25 mg via INTRAVENOUS

## 2021-08-16 NOTE — Patient Instructions (Signed)
Paclitaxel Injection What is this medication? PACLITAXEL (PAK li TAX el) treats some types of cancer. It works by slowing down the growth of cancer cells. This medicine may be used for other purposes; ask your health care provider or pharmacist if you have questions. COMMON BRAND NAME(S): Onxol, Taxol What should I tell my care team before I take this medication? They need to know if you have any of these conditions: Heart disease Liver disease Low white blood cell levels An unusual or allergic reaction to paclitaxel, other medications, foods, dyes, or preservatives If you or your partner are pregnant or trying to get pregnant Breast-feeding How should I use this medication? This medication is injected into a vein. It is given by your care team in a hospital or clinic setting. Talk to your care team about the use of this medication in children. While it may be given to children for selected conditions, precautions do apply. Overdosage: If you think you have taken too much of this medicine contact a poison control center or emergency room at once. NOTE: This medicine is only for you. Do not share this medicine with others. What if I miss a dose? Keep appointments for follow-up doses. It is important not to miss your dose. Call your care team if you are unable to keep an appointment. What may interact with this medication? Do not take this medication with any of the following: Live virus vaccines Other medications may affect the way this medication works. Talk with your care team about all of the medications you take. They may suggest changes to your treatment plan to lower the risk of side effects and to make sure your medications work as intended. This list may not describe all possible interactions. Give your health care provider a list of all the medicines, herbs, non-prescription drugs, or dietary supplements you use. Also tell them if you smoke, drink alcohol, or use illegal drugs. Some  items may interact with your medicine. What should I watch for while using this medication? Your condition will be monitored carefully while you are receiving this medication. You may need blood work while taking this medication. This medication may make you feel generally unwell. This is not uncommon as chemotherapy can affect healthy cells as well as cancer cells. Report any side effects. Continue your course of treatment even though you feel ill unless your care team tells you to stop. This medication can cause serious allergic reactions. To reduce the risk, your care team may give you other medications to take before receiving this one. Be sure to follow the directions from your care team. This medication may increase your risk of getting an infection. Call your care team for advice if you get a fever, chills, sore throat, or other symptoms of a cold or flu. Do not treat yourself. Try to avoid being around people who are sick. This medication may increase your risk to bruise or bleed. Call your care team if you notice any unusual bleeding. Be careful brushing or flossing your teeth or using a toothpick because you may get an infection or bleed more easily. If you have any dental work done, tell your dentist you are receiving this medication. Talk to your care team if you may be pregnant. Serious birth defects can occur if you take this medication during pregnancy. Talk to your care team before breastfeeding. Changes to your treatment plan may be needed. What side effects may I notice from receiving this medication? Side effects that you   should report to your care team as soon as possible: Allergic reactions--skin rash, itching, hives, swelling of the face, lips, tongue, or throat Heart rhythm changes--fast or irregular heartbeat, dizziness, feeling faint or lightheaded, chest pain, trouble breathing Increase in blood pressure Infection--fever, chills, cough, sore throat, wounds that don't heal,  pain or trouble when passing urine, general feeling of discomfort or being unwell Low blood pressure--dizziness, feeling faint or lightheaded, blurry vision Low red blood cell level--unusual weakness or fatigue, dizziness, headache, trouble breathing Painful swelling, warmth, or redness of the skin, blisters or sores at the infusion site Pain, tingling, or numbness in the hands or feet Slow heartbeat--dizziness, feeling faint or lightheaded, confusion, trouble breathing, unusual weakness or fatigue Unusual bruising or bleeding Side effects that usually do not require medical attention (report to your care team if they continue or are bothersome): Diarrhea Hair loss Joint pain Loss of appetite Muscle pain Nausea Vomiting This list may not describe all possible side effects. Call your doctor for medical advice about side effects. You may report side effects to FDA at 1-800-FDA-1088. Where should I keep my medication? This medication is given in a hospital or clinic. It will not be stored at home. NOTE: This sheet is a summary. It may not cover all possible information. If you have questions about this medicine, talk to your doctor, pharmacist, or health care provider.  2023 Elsevier/Gold Standard (2021-05-05 00:00:00)

## 2021-08-19 ENCOUNTER — Other Ambulatory Visit: Payer: Self-pay

## 2021-08-22 ENCOUNTER — Encounter: Payer: Self-pay | Admitting: Oncology

## 2021-08-22 ENCOUNTER — Inpatient Hospital Stay (INDEPENDENT_AMBULATORY_CARE_PROVIDER_SITE_OTHER): Payer: Medicare Other | Admitting: Hematology and Oncology

## 2021-08-22 ENCOUNTER — Inpatient Hospital Stay: Payer: Medicare Other

## 2021-08-22 ENCOUNTER — Encounter: Payer: Self-pay | Admitting: Hematology and Oncology

## 2021-08-22 VITALS — BP 111/55 | HR 86 | Temp 98.4°F | Resp 18 | Ht 64.0 in | Wt 187.3 lb

## 2021-08-22 DIAGNOSIS — Z170 Estrogen receptor positive status [ER+]: Secondary | ICD-10-CM

## 2021-08-22 DIAGNOSIS — C50112 Malignant neoplasm of central portion of left female breast: Secondary | ICD-10-CM | POA: Diagnosis not present

## 2021-08-22 DIAGNOSIS — Z5111 Encounter for antineoplastic chemotherapy: Secondary | ICD-10-CM | POA: Diagnosis not present

## 2021-08-22 LAB — CBC AND DIFFERENTIAL
HCT: 39 (ref 36–46)
Hemoglobin: 13.3 (ref 12.0–16.0)
Neutrophils Absolute: 1.09
Platelets: 334 10*3/uL (ref 150–400)
WBC: 2.6

## 2021-08-22 LAB — BASIC METABOLIC PANEL
BUN: 14 (ref 4–21)
CO2: 26 — AB (ref 13–22)
Chloride: 100 (ref 99–108)
Creatinine: 0.8 (ref 0.5–1.1)
Glucose: 233
Potassium: 4.2 mEq/L (ref 3.5–5.1)
Sodium: 135 — AB (ref 137–147)

## 2021-08-22 LAB — HEPATIC FUNCTION PANEL
ALT: 37 U/L — AB (ref 7–35)
AST: 32 (ref 13–35)
Alkaline Phosphatase: 57 (ref 25–125)
Bilirubin, Total: 1

## 2021-08-22 LAB — COMPREHENSIVE METABOLIC PANEL
Albumin: 4.5 (ref 3.5–5.0)
Calcium: 9.6 (ref 8.7–10.7)

## 2021-08-22 LAB — CBC: RBC: 4.1 (ref 3.87–5.11)

## 2021-08-22 MED ORDER — HEPARIN SOD (PORK) LOCK FLUSH 100 UNIT/ML IV SOLN
500.0000 [IU] | Freq: Once | INTRAVENOUS | Status: AC | PRN
  Administered 2021-08-22: 500 [IU]

## 2021-08-22 MED ORDER — SODIUM CHLORIDE 0.9 % IV SOLN: Freq: Once | INTRAVENOUS | Status: AC

## 2021-08-22 MED ORDER — SODIUM CHLORIDE 0.9% FLUSH
10.0000 mL | INTRAVENOUS | Status: DC | PRN
  Administered 2021-08-22: 10 mL

## 2021-08-22 MED ORDER — SODIUM CHLORIDE 0.9 % IV SOLN: Freq: Once | INTRAVENOUS | Status: DC

## 2021-08-22 MED FILL — Paclitaxel IV Conc 300 MG/50ML (6 MG/ML): INTRAVENOUS | Qty: 27 | Status: AC

## 2021-08-22 NOTE — Addendum Note (Signed)
Addended by: Daryel November on: 08/22/2021 05:11 PM   Modules accepted: Orders

## 2021-08-22 NOTE — Progress Notes (Signed)
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 She was treated with left mastectomy.  One node was positive for malignancy.  EndoPredict has revealed her to be at extremely high risk for recurrence, with an EP clin score of 5.4.  Her risk of recurrence in the next 10 years is 55% and she has an estimated chemotherapy benefit of 27%.  She completed 4 cycles of AC chemotherapy in May.  She is now receiving weekly paclitaxel. During cycle 9, she began to have seizure activity during her pre-meds and she was transported to the hospital via EMS. She was hospitalized, later transferred and treated for meningitis. She was discharged and seen on July 10 to restart therapy.  She remained fatigued and did not remember everything from her admission. She denied any residual symptoms.  She received a 9th cycle of paclitaxel on July 18.  She has increased general muscle weakness after receiving paclitaxel.  She had a break for 2-3 weeks. She is due cycle 12, which is #7 paclitaxel this week. She is orthostatic today down to 89/56. She reports feeling very tired and increase neck and back pain, which she relates to early signs that she would have more difficulty tolerating chemotherapy.  We will plan for IVF today, 1 liter and will hold chemotherapy and plan for IVF tomorrow. She will return to  clinic in one week for repeat evaluation.     The patient understands the plans discussed today and is in agreement with them.  She knows to contact our office if she develops concerns prior to her next appointment.    Melodye Ped, NP  [facility] [address] [place] [address] Dept: [phone] Dept Fax: [phone]   Orders Placed This Encounter  Procedures   CBC and differential    This external order was created through the Results Console.   CBC    This external order was created through the Results Console.   Basic metabolic panel    This external order was created through the Results Console.   Comprehensive metabolic panel    This external order was created through the Results Console.   Hepatic function panel    This external order was created through the Results Console.      CHIEF COMPLAINT:  CC: A 66 year old female with history of breast cancer here for weekly evaluation  Current Treatment:  Paclitaxel  INTERVAL HISTORY:  Brandy Maxwell is here today for repeat clinical assessment. She denies fevers or chills. She denies pain. Her appetite is good. Her weight has been stable.  I have reviewed the past medical history, past surgical history, social history and family history with the patient and they are unchanged from previous note.  ALLERGIES:  is allergic to nitrofurantoin and sulfa antibiotics.  MEDICATIONS:  Current Outpatient Medications  Medication Sig Dispense Refill   dexamethasone (DECADRON) 4 MG  tablet Take 3 tabs at the night before and 3 tab the morning of chemotherapy 60 tablet 0   dicyclomine (BENTYL) 10 MG capsule Take 10 mg by mouth 3 (three) times daily as needed for pain. With IBS     DULoxetine (CYMBALTA) 60 MG capsule Take 1 capsule by mouth at bedtime.     EPINEPHrine 0.3 mg/0.3 mL IJ SOAJ injection Inject 0.3 mg into the muscle as needed for anaphylaxis.      estradiol (ESTRACE) 0.1 MG/GM vaginal cream Place 1 Applicatorful vaginally at bedtime. 42.5 g 0   ezetimibe (ZETIA) 10 MG tablet Take 10 mg by mouth daily.     fenofibrate 160 MG tablet Take 160 mg by mouth daily.     gabapentin (NEURONTIN) 600 MG tablet Take 1,200 mg by mouth 3 (three) times daily.     HYDROcodone-acetaminophen (NORCO/VICODIN) 5-325 MG tablet Take by mouth.     levETIRAcetam (KEPPRA) 750 MG tablet Take 750 mg by mouth 2 (two) times daily.     lidocaine (LIDODERM) 5 % Place onto the skin.     LINZESS 72 MCG capsule Take 1 capsule (72 mcg total) by mouth daily. 90 capsule 4   magic mouthwash (lidocaine, diphenhydrAMINE, alum & mag hydroxide) suspension Take 5 mLs by mouth every 3 (three) hours as needed. Swish and spit for mouth or throat pain     metoprolol succinate (TOPROL-XL) 25 MG 24 hr tablet Take 25 mg by mouth daily.     midodrine (PROAMATINE) 5 MG tablet Take 5 mg by mouth 3 (three) times daily with meals.     mirabegron ER (MYRBETRIQ) 50 MG TB24 tablet Take 1 tablet by mouth daily.     omeprazole (PRILOSEC OTC) 20 MG tablet Take 1 tablet by mouth daily.     ondansetron (ZOFRAN) 8 MG tablet Take 1 tablet (8 mg total) by mouth 2 (two) times daily as needed. Start on the third day after chemotherapy. 30 tablet 1   pramipexole (MIRAPEX) 1 MG tablet Take 1 mg by mouth at bedtime. Takes 1/2 tab     prochlorperazine (COMPAZINE) 10 MG tablet Take 1 tablet (10 mg total) by mouth every 6 (six) hours as needed (Nausea or vomiting). 30 tablet 1   ramelteon (ROZEREM) 8 MG tablet Take 8 mg by mouth at bedtime.     Vitamin D, Ergocalciferol, (DRISDOL) 1.25 MG (50000 UT) CAPS capsule TAKE 1 CAPSULE BY MOUTH ONCE A WEEK FOR 30 DAYS     vitamin E 1000 UNIT capsule Take 1,000 Units by mouth daily.     zinc gluconate 50 MG tablet Take 50 mg by mouth daily.     No current facility-administered medications for this visit.    HISTORY OF PRESENT ILLNESS:   Oncology History  Malignant  neoplasm of central portion of left breast in female, estrogen receptor positive ([place])  09/16/2020 Mammogram   DIGITAL SCREENING BILATERAL MAMMOGRAM: In the left breast, a possible mass warrants further evaluation. In the right breast, no findings suspicious for malignancy.    10/08/2020 Mammogram   DIAGNOSTIC UNILATERAL LEFT MAMMOGRAM AND LEFT BREAST ULTRASOUND: There is architectural distortion at the left breast 12 o'clock.  Targeted ultrasound is performed, showing no focal abnormal discrete cystic or solid lesion at left breast 12 o'clock the correlate to the mammographic finding. Ultrasound of the left axilla is negative.   10/20/2020 Pathology Results   Breast, left, needle core biopsy, 12 o'clock:  Invasive mammary carcinoma             Mammary carcinoma in situ HER2: Negative (0) ER: Positive 30% PR: Negative 0% Ki67: 1%   10/26/2020 Initial Diagnosis   Malignant neoplasm of central portion of left breast in female, estrogen receptor positive ([place])   12/17/2020 Cancer Staging   Staging form: Breast, AJCC 8th Edition - Clinical stage from 12/17/2020: Stage IIA (cT1b, cN1(sn), cM0, G2, ER+, PR-, HER2-) - Signed by [name], MD on 02/13/2021 Histopathologic type: Infiltrating duct carcinoma, NOS Stage prefix: Initial diagnosis Method of lymph node assessment: Sentinel lymph node biopsy Nuclear grade: G2 Multigene prognostic tests performed: EndoPredict Histologic grading system: 3 grade system Laterality: Left Tumor size (mm): 7 Lymph-vascular invasion (LVI): LVI not present (absent)/not identified Diagnostic confirmation: Positive histology Specimen type: Excision Staged by: Managing physician Menopausal status: Postmenopausal Ki-67 (%): 1 EndoPredict EPclin risk score: 5.4 EndoPredict EPclin risk level: High risk EndoPredict 10-year percentage risk of distant recurrence (%): 55 EndoPredict 10-year risk of distant recurrence: High risk Stage used  in treatment planning: Yes National guidelines used in treatment planning: Yes Type of national guideline used in treatment planning: NCCN Staging comments: Endopredict with 27% benefit from chemotherapy   12/17/2020 Pathology Results   Breast, simple mastectomy, left:         Invasive ductal carcinoma, grade 2, 7 mm, and ductal carcinoma in situ         Ribbon clip and biopsy reaction present         All margins negative for invasive carcinoma and DCIS         Fibrocystic changes Lymph node, sentinel, biopsy, left axillary:         Tumor present in regional lymph node (1/1)   02/18/2021 -  Chemotherapy   Patient is on Treatment Plan : Breast AC q21 Days / PACLitaxel q7d      Genetic Testing   Negative genetic testing. No pathogenic variants identified on the [facility] Multi-Cancer+RNA panel. The report date is 03/08/2021.  The Multi-Cancer Panel + RNA offered by [facility] includes sequencing and/or deletion duplication testing of the following 84 genes: AIP, ALK, APC, ATM, AXIN2,BAP1,  BARD1, BLM, BMPR1A, BRCA1, BRCA2, BRIP1, CASR, CDC73, CDH1, CDK4, CDKN1B, CDKN1C, CDKN2A (p14ARF), CDKN2A (p16INK4a), CEBPA, CHEK2, CTNNA1, DICER1, DIS3L2, EGFR (c.2369C>T, p.Thr790Met variant only), EPCAM (Deletion/duplication testing only), FH, FLCN, GATA2, GPC3, GREM1 (Promoter region deletion/duplication testing only), HOXB13 (c.251G>A, p.Gly84Glu), HRAS, KIT, MAX, MEN1, MET, MITF (c.952G>A, p.Glu318Lys variant only), MLH1, MSH2, MSH3, MSH6, MUTYH, NBN, NF1, NF2, NTHL1, PALB2, PDGFRA, PHOX2B, PMS2, POLD1, POLE, POT1, PRKAR1A, PTCH1, PTEN, RAD50, RAD51C, RAD51D, RB1, RECQL4, RET, RUNX1, SDHAF2, SDHA (sequence changes only), SDHB, SDHC, SDHD, SMAD4, SMARCA4, SMARCB1, SMARCE1, STK11, SUFU, TERC, TERT, TMEM127, TP53, TSC1, TSC2, VHL, WRN and WT1.       REVIEW OF SYSTEMS:   Constitutional: Denies fevers, chills or abnormal weight loss Eyes: Denies blurriness of vision Ears, nose, mouth, throat, and face: Denies  mucositis or sore throat Respiratory: Denies cough, dyspnea or wheezes Cardiovascular: Denies palpitation, chest discomfort or lower extremity swelling Gastrointestinal:  Denies nausea, heartburn or change in bowel habits Skin: Denies abnormal skin rashes Lymphatics: Denies new lymphadenopathy or easy bruising Neurological:Denies numbness, tingling or new weaknesses Behavioral/Psych: Mood is stable, no new changes  All other systems were reviewed with the patient and are negative.   VITALS:  Blood pressure (!) 89/59, pulse (!) 119, temperature 97.8 F (36.6 C), temperature source Oral, resp. rate 18, height 5'  4" (1.626 m), weight 187 lb 4.8 oz (85 kg), last menstrual period 09/02/1998, SpO2 94 %.  Wt Readings from Last 3 Encounters:  08/22/21 187 lb 4.8 oz (85 kg)  08/16/21 188 lb 0.6 oz (85.3 kg)  08/15/21 185 lb 8 oz (84.1 kg)    Body mass index is 32.15 kg/m.  Performance status (ECOG): 1 - Symptomatic but completely ambulatory  PHYSICAL EXAM:   GENERAL:alert, no distress and comfortable SKIN: skin color, texture, turgor are normal, no rashes or significant lesions EYES: normal, Conjunctiva are pink and non-injected, sclera clear OROPHARYNX:no exudate, no erythema and lips, buccal mucosa, and tongue normal  NECK: supple, thyroid normal size, non-tender, without nodularity LYMPH:  no palpable lymphadenopathy in the cervical, axillary or inguinal LUNGS: clear to auscultation and percussion with normal breathing effort HEART: regular rate & rhythm and no murmurs and no lower extremity edema ABDOMEN:abdomen soft, non-tender and normal bowel sounds Musculoskeletal:no cyanosis of digits and no clubbing  NEURO: alert & oriented x 3 with fluent speech, no focal motor/sensory deficits  LABORATORY DATA:  I have reviewed the data as listed    Component Value Date/Time   NA 135 (A) 08/22/2021 0000   K 4.2 08/22/2021 0000   CL 100 08/22/2021 0000   CO2 26 (A) 08/22/2021 0000    BUN 14 08/22/2021 0000   CREATININE 0.8 08/22/2021 0000   CALCIUM 9.6 08/22/2021 0000   ALBUMIN 4.5 08/22/2021 0000   AST 32 08/22/2021 0000   ALT 37 (A) 08/22/2021 0000   ALKPHOS 57 08/22/2021 0000    No results found for: "SPEP", "UPEP"  Lab Results  Component Value Date   WBC 2.6 08/22/2021   NEUTROABS 1.09 08/22/2021   HGB 13.3 08/22/2021   HCT 39 08/22/2021   MCV 97 06/03/2021   PLT 334 08/22/2021      Chemistry      Component Value Date/Time   NA 135 (A) 08/22/2021 0000   K 4.2 08/22/2021 0000   CL 100 08/22/2021 0000   CO2 26 (A) 08/22/2021 0000   BUN 14 08/22/2021 0000   CREATININE 0.8 08/22/2021 0000   GLU 233 08/22/2021 0000      Component Value Date/Time   CALCIUM 9.6 08/22/2021 0000   ALKPHOS 57 08/22/2021 0000   AST 32 08/22/2021 0000   ALT 37 (A) 08/22/2021 0000       RADIOGRAPHIC STUDIES: I have personally reviewed the radiological images as listed and agreed with the findings in the report. No results found.

## 2021-08-22 NOTE — Assessment & Plan Note (Addendum)
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 Shewas treated withleft mastectomy. One node was positive for malignancy. EndoPredict has revealed her to beatextremely high riskfor recurrence,with an EP clin score of 5.4.Her risk of recurrence in the next 10 years is 55% and she has an estimated chemotherapy benefit of 27%. She completed 4 cycles of AC chemotherapyin May. She is now receiving weekly paclitaxel. During cycle 9, she began to have seizure activity during her pre-meds and she was transported to the hospital via EMS. She was hospitalized, later transferred and treated for meningitis. Shewasdischarged and seen onJuly 10 to restart therapy. Sheremained fatigued and did notremember everything from her admission. She deniedany residual symptoms. She received a9thcycleof paclitaxelon July 18.She has increased general muscle weakness after receiving paclitaxel.She had a break for 2-3 weeks.She is due cycle 12, which is #7 paclitaxel this week. She is orthostatic today down to 89/56. She reports feeling very tired and increase neck and back pain, which she relates to early signs that she would have more difficulty tolerating chemotherapy.  We will plan for IVF today, 1 liter and will hold chemotherapy and plan for IVF tomorrow. She will return to clinic in one week for repeat evaluation.

## 2021-08-22 NOTE — Progress Notes (Signed)
Reduce dose of paclitaxel by 20% due to poor tolerance and neutropenia per Dr. Hinton Rao.

## 2021-08-22 NOTE — Progress Notes (Signed)
Ok to proceed with paclitaxel despite ANC=1090 per Dr. Hinton Rao.

## 2021-08-23 ENCOUNTER — Inpatient Hospital Stay: Payer: Medicare Other

## 2021-08-23 VITALS — BP 98/65 | HR 73 | Temp 98.0°F | Resp 18

## 2021-08-23 DIAGNOSIS — Z5111 Encounter for antineoplastic chemotherapy: Secondary | ICD-10-CM | POA: Diagnosis not present

## 2021-08-23 DIAGNOSIS — C50112 Malignant neoplasm of central portion of left female breast: Secondary | ICD-10-CM

## 2021-08-23 MED ORDER — SODIUM CHLORIDE 0.9 % IV SOLN: Freq: Once | INTRAVENOUS | Status: AC

## 2021-08-23 MED ORDER — SODIUM CHLORIDE 0.9% FLUSH
10.0000 mL | Freq: Once | INTRAVENOUS | Status: AC | PRN
  Administered 2021-08-23: 10 mL

## 2021-08-23 MED ORDER — HEPARIN SOD (PORK) LOCK FLUSH 100 UNIT/ML IV SOLN
500.0000 [IU] | Freq: Once | INTRAVENOUS | Status: AC | PRN
  Administered 2021-08-23: 500 [IU]

## 2021-08-23 NOTE — Patient Instructions (Signed)
Rehydration, Adult Rehydration is the replacement of body fluids, salts, and minerals (electrolytes) that are lost during dehydration. Dehydration is when there is not enough water or other fluids in the body. This happens when you lose more fluids than you take in. Common causes of dehydration include: Not drinking enough fluids. This can occur when you are ill or doing activities that require a lot of energy, especially in hot weather. Conditions that cause loss of water or other fluids, such as diarrhea, vomiting, sweating, or urinating a lot. Other illnesses, such as fever or infection. Certain medicines, such as those that remove excess fluid from the body (diuretics). Symptoms of mild or moderate dehydration may include thirst, dry lips and mouth, and dizziness. Symptoms of severe dehydration may include increased heart rate, confusion, fainting, and not urinating. For severe dehydration, you may need to get fluids through an IV at the hospital. For mild or moderate dehydration, you can usually rehydrate at home by drinking certain fluids as told by your health care provider. What are the risks? Generally, rehydration is safe. However, taking in too much fluid (overhydration) can be a problem. This is rare. Overhydration can cause an electrolyte imbalance, kidney failure, or a decrease in salt (sodium) levels in the body. Supplies needed You will need an oral rehydration solution (ORS) if your health care provider tells you to use one. This is a drink to treat dehydration. It can be found in pharmacies and retail stores. How to rehydrate Fluids Follow instructions from your health care provider for rehydration. The kind of fluid and the amount you should drink depend on your condition. In general, you should choose drinks that you prefer. If told by your health care provider, drink an ORS. Make an ORS by following instructions on the package. Start by drinking small amounts, about  cup (120  mL) every 5-10 minutes. Slowly increase how much you drink until you have taken the amount recommended by your health care provider. Drink enough clear fluids to keep your urine pale yellow. If you were told to drink an ORS, finish it first, then start slowly drinking other clear fluids. Drink fluids such as: Water. This includes sparkling water and flavored water. Drinking only water can lead to having too little sodium in your body (hyponatremia). Follow the advice of your health care provider. Water from ice chips you suck on. Fruit juice with water you add to it (diluted). Sports drinks. Hot or cold herbal teas. Broth-based soups. Milk or milk products. Food Follow instructions from your health care provider about what to eat while you rehydrate. Your health care provider may recommend that you slowly begin eating regular foods in small amounts. Eat foods that contain a healthy balance of electrolytes, such as bananas, oranges, potatoes, tomatoes, and spinach. Avoid foods that are greasy or contain a lot of sugar. In some cases, you may get nutrition through a feeding tube that is passed through your nose and into your stomach (nasogastric tube, or NG tube). This may be done if you have uncontrolled vomiting or diarrhea. Beverages to avoid  Certain beverages may make dehydration worse. While you rehydrate, avoid drinking alcohol. How to tell if you are recovering from dehydration You may be recovering from dehydration if: You are urinating more often than before you started rehydrating. Your urine is pale yellow. Your energy level improves. You vomit less frequently. You have diarrhea less frequently. Your appetite improves or returns to normal. You feel less dizzy or less light-headed.   Your skin tone and color start to look more normal. Follow these instructions at home: Take over-the-counter and prescription medicines only as told by your health care provider. Do not take sodium  tablets. Doing this can lead to having too much sodium in your body (hypernatremia). Contact a health care provider if: You continue to have symptoms of mild or moderate dehydration, such as: Thirst. Dry lips. Slightly dry mouth. Dizziness. Dark urine or less urine than normal. Muscle cramps. You continue to vomit or have diarrhea. Get help right away if you: Have symptoms of dehydration that get worse. Have a fever. Have a severe headache. Have been vomiting and the following happens: Your vomiting gets worse or does not go away. Your vomit includes blood or green matter (bile). You cannot eat or drink without vomiting. Have problems with urination or bowel movements, such as: Diarrhea that gets worse or does not go away. Blood in your stool (feces). This may cause stool to look black and tarry. Not urinating, or urinating only a small amount of very dark urine, within 6-8 hours. Have trouble breathing. Have symptoms that get worse with treatment. These symptoms may represent a serious problem that is an emergency. Do not wait to see if the symptoms will go away. Get medical help right away. Call your local emergency services (911 in the [place] Do not drive yourself to the hospital. Summary Rehydration is the replacement of body fluids and minerals (electrolytes) that are lost during dehydration. Follow instructions from your health care provider for rehydration. The kind of fluid and amount you should drink depend on your condition. Slowly increase how much you drink until you have taken the amount recommended by your health care provider. Contact your health care provider if you continue to show signs of mild or moderate dehydration. This information is not intended to replace advice given to you by your health care provider. Make sure you discuss any questions you have with your health care provider. Document Revised: 02/19/2019 Document Reviewed: 12/30/2018 Elsevier Patient  Education  2023 [employer]  

## 2021-08-29 ENCOUNTER — Other Ambulatory Visit: Payer: Self-pay

## 2021-08-29 ENCOUNTER — Encounter: Payer: Self-pay | Admitting: Hematology and Oncology

## 2021-08-29 ENCOUNTER — Other Ambulatory Visit: Payer: Medicare Other

## 2021-08-29 ENCOUNTER — Encounter: Payer: Self-pay | Admitting: Oncology

## 2021-08-29 ENCOUNTER — Inpatient Hospital Stay: Payer: Medicare Other

## 2021-08-29 ENCOUNTER — Inpatient Hospital Stay (INDEPENDENT_AMBULATORY_CARE_PROVIDER_SITE_OTHER): Payer: Medicare Other | Admitting: Hematology and Oncology

## 2021-08-29 VITALS — BP 115/84 | HR 100 | Temp 96.8°F | Resp 20 | Ht 64.0 in | Wt 190.0 lb

## 2021-08-29 DIAGNOSIS — Z170 Estrogen receptor positive status [ER+]: Secondary | ICD-10-CM

## 2021-08-29 DIAGNOSIS — R300 Dysuria: Secondary | ICD-10-CM

## 2021-08-29 DIAGNOSIS — N390 Urinary tract infection, site not specified: Secondary | ICD-10-CM

## 2021-08-29 DIAGNOSIS — C50112 Malignant neoplasm of central portion of left female breast: Secondary | ICD-10-CM

## 2021-08-29 HISTORY — DX: Urinary tract infection, site not specified: N39.0

## 2021-08-29 LAB — BASIC METABOLIC PANEL
BUN: 12 (ref 4–21)
CO2: 24 — AB (ref 13–22)
Chloride: 105 (ref 99–108)
Creatinine: 0.6 (ref 0.5–1.1)
Glucose: 191
Potassium: 4.2 mEq/L (ref 3.5–5.1)
Sodium: 137 (ref 137–147)

## 2021-08-29 LAB — CBC: RBC: 4.04 (ref 3.87–5.11)

## 2021-08-29 LAB — CBC AND DIFFERENTIAL
HCT: 39 (ref 36–46)
Hemoglobin: 13.4 (ref 12.0–16.0)
Neutrophils Absolute: 1.6
Platelets: 318 10*3/uL (ref 150–400)
WBC: 3.8

## 2021-08-29 LAB — HEPATIC FUNCTION PANEL
ALT: 38 U/L — AB (ref 7–35)
AST: 42 — AB (ref 13–35)
Alkaline Phosphatase: 54 (ref 25–125)
Bilirubin, Total: 0.6

## 2021-08-29 LAB — COMPREHENSIVE METABOLIC PANEL
Albumin: 4.4 (ref 3.5–5.0)
Calcium: 10 (ref 8.7–10.7)

## 2021-08-29 MED FILL — Paclitaxel IV Conc 300 MG/50ML (6 MG/ML): INTRAVENOUS | Qty: 21 | Status: AC

## 2021-08-29 MED FILL — Dexamethasone Sodium Phosphate Inj 100 MG/10ML: INTRAMUSCULAR | Qty: 1 | Status: AC

## 2021-08-29 NOTE — Assessment & Plan Note (Signed)
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 Shewas treated withleft mastectomy. One node was positive for malignancy. EndoPredict has revealed her to be at extremely high riskfor recurrence,with an EP clin score of 5.4.Her risk of recurrence in the next 10 years is 55% and she has an estimated chemotherapy benefit of 27%. She completed 4 cycles of AC chemotherapy in May. She is now receiving weekly paclitaxel. During cycle 9, she began to have seizure activity during her pre-meds and she was transported to the hospital via EMS. She was hospitalized, later transferred and treated for meningitis. She was discharged and seen on July 10 to restart therapy.  She remained fatigued and did not remember everything from her admission. She denied any residual symptoms.  She received a 9th cycle of paclitaxel on July 18.  She then had increased general muscle weakness since receiving paclitaxel.  She still describes episodes of shakiness.  Her husband states that she did not remember she had this appointment today when he went to get her out of bed. CT head was negative. Keppra was increased as this was felt to possibly represent seizure activity.  Paclitaxel was held again.  She resumed paclitaxel on August 8 and received 2 weekly cycles.  Paclitaxel was held again last week due to worsening fatigue and pain.

## 2021-08-29 NOTE — Progress Notes (Signed)
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 She was treated with left mastectomy.  One node was positive for malignancy.  EndoPredict has revealed her to be at extremely high risk for recurrence, with an EP clin score of 5.4.  Her risk of recurrence in the next 10 years is 55% and she has an estimated chemotherapy benefit of 27%.  She completed 4 cycles of AC chemotherapy in May.  She is now receiving weekly paclitaxel. During cycle 9, she began to have seizure activity during her pre-meds and she was transported to the hospital via EMS. She was hospitalized, later transferred and treated for meningitis. She was discharged and seen on July 10 to restart therapy.  She remained fatigued and did not remember everything from her admission. She denied any residual symptoms.  She received a 9th cycle of paclitaxel on July 18.  She then had increased general muscle weakness since receiving paclitaxel.  She still describes episodes of shakiness.  Her husband states that she did not remember she had this appointment today when he went to get her out of bed. CT head was negative. Keppra was increased as this was felt to possibly represent seizure activity.  Paclitaxel was held again.  She resumed paclitaxel on August 8 and received 2 weekly cycles.  Paclitaxel was held again last week due to worsening fatigue and pain.  Recurrent urinary tract infection History of recurrent UTIs.  Her last infection was in June with Enterococcus faecalis was treated with ampicillin.  In March she had Klebsiella pneumoniae treated with  levofloxacin.  Urinalysis today is suspicious for recurrent UTI.  As she has had different organisms in the past, I will await culture prior to starting prior to starting antibiotic.   The patient understands the plans discussed today and is in agreement with them.  She knows to contact our office if she develops concerns prior to her next appointment.   I provided 20 minutes of face-to-face time during this encounter and > 50% was spent counseling as documented under my assessment and plan.    Marvia Pickles, PA-C  [facility] AT [facility] [address] [place] [address] Dept: [phone] Dept Fax: [phone]   Orders Placed This Encounter  Procedures   Miscellaneous test (send-out)    Standing Status:   Future    Number of Occurrences:   1    Standing Expiration Date:   08/30/2022    Order Specific Question:   Test name / description:    Answer:   urinalysis and urine culture at [facility]   CBC and differential    This external order was created through the Results Console.   CBC    This external order was created through the Results Console.   Basic metabolic panel    This external order was created through the Results Console.   Comprehensive metabolic panel    This external order was created through the Results Console.   Hepatic function panel    This external order was created through the Results Console.  CHIEF COMPLAINT:  CC: Stage IIA hormone receptor positive breast cancer  Current Treatment: Weekly paclitaxel  HISTORY OF PRESENT ILLNESS:   Oncology History  Malignant neoplasm of central portion of left breast in female, estrogen receptor positive ([place])  09/16/2020 Mammogram   DIGITAL SCREENING BILATERAL MAMMOGRAM: In the left breast, a possible mass warrants further evaluation. In the right breast, no findings suspicious for malignancy.    10/08/2020 Mammogram   DIAGNOSTIC UNILATERAL LEFT MAMMOGRAM AND  LEFT BREAST ULTRASOUND: There is architectural distortion at the left breast 12 o'clock.  Targeted ultrasound is performed, showing no focal abnormal discrete cystic or solid lesion at left breast 12 o'clock the correlate to the mammographic finding. Ultrasound of the left axilla is negative.   10/20/2020 Pathology Results   Breast, left, needle core biopsy, 12 o'clock:             Invasive mammary carcinoma             Mammary carcinoma in situ HER2: Negative (0) ER: Positive 30% PR: Negative 0% Ki67: 1%   10/26/2020 Initial Diagnosis   Malignant neoplasm of central portion of left breast in female, estrogen receptor positive ([place])   12/17/2020 Cancer Staging   Staging form: Breast, AJCC 8th Edition - Clinical stage from 12/17/2020: Stage IIA (cT1b, cN1(sn), cM0, G2, ER+, PR-, HER2-) - Signed by [name], MD on 02/13/2021 Histopathologic type: Infiltrating duct carcinoma, NOS Stage prefix: Initial diagnosis Method of lymph node assessment: Sentinel lymph node biopsy Nuclear grade: G2 Multigene prognostic tests performed: EndoPredict Histologic grading system: 3 grade system Laterality: Left Tumor size (mm): 7 Lymph-vascular invasion (LVI): LVI not present (absent)/not identified Diagnostic confirmation: Positive histology Specimen type: Excision Staged by: Managing physician Menopausal status: Postmenopausal Ki-67 (%): 1 EndoPredict EPclin risk score: 5.4 EndoPredict EPclin risk level: High risk EndoPredict 10-year percentage risk of distant recurrence (%): 55 EndoPredict 10-year risk of distant recurrence: High risk Stage used in treatment planning: Yes National guidelines used in treatment planning: Yes Type of national guideline used in treatment planning: NCCN Staging comments: Endopredict with 27% benefit from chemotherapy   12/17/2020 Pathology Results   Breast, simple mastectomy, left:         Invasive ductal carcinoma, grade 2, 7 mm, and ductal  carcinoma in situ         Ribbon clip and biopsy reaction present         All margins negative for invasive carcinoma and DCIS         Fibrocystic changes Lymph node, sentinel, biopsy, left axillary:         Tumor present in regional lymph node (1/1)   02/18/2021 -  Chemotherapy   Patient is on Treatment Plan : Breast AC q21 Days / PACLitaxel q7d      Genetic Testing   Negative genetic testing. No pathogenic variants identified on the [facility] Multi-Cancer+RNA panel. The report date is 03/08/2021.  The Multi-Cancer Panel + RNA offered by [facility] includes sequencing and/or deletion duplication testing of the following 84 genes: AIP, ALK, APC, ATM, AXIN2,BAP1,  BARD1, BLM, BMPR1A, BRCA1, BRCA2, BRIP1, CASR, CDC73, CDH1, CDK4, CDKN1B, CDKN1C, CDKN2A (p14ARF), CDKN2A (p16INK4a), CEBPA, CHEK2, CTNNA1, DICER1, DIS3L2, EGFR (c.2369C>T, p.Thr790Met variant only), EPCAM (Deletion/duplication testing only), FH, FLCN, GATA2, GPC3, GREM1 (Promoter region deletion/duplication testing only), HOXB13 (c.251G>A, p.Gly84Glu), HRAS, KIT, MAX, MEN1, MET, MITF (c.952G>A, p.Glu318Lys variant only), MLH1, MSH2, MSH3, MSH6, MUTYH, NBN, NF1, NF2, NTHL1, PALB2, PDGFRA, PHOX2B, PMS2, POLD1, POLE, POT1, PRKAR1A, PTCH1, PTEN, RAD50,  RAD51C, RAD51D, RB1, RECQL4, RET, RUNX1, SDHAF2, SDHA (sequence changes only), SDHB, SDHC, SDHD, SMAD4, SMARCA4, SMARCB1, SMARCE1, STK11, SUFU, TERC, TERT, TMEM127, TP53, TSC1, TSC2, VHL, WRN and WT1.       INTERVAL HISTORY:  Brandy Maxwell is here today for repeat clinical assessment prior to 8th cycle of paclitaxel.  She states she is feeling much better this week.  However, she has symptoms of a moderate UTI with chills, dysuria and frequency of urination.  She denies any obvious seizure activity, but occasionally feels shaky.  She denies dizziness.  She is ambulating with assistance today.   She denies fevers. She denies pain. Her appetite is good. Her weight has increased 3 pounds over last week  .  REVIEW OF SYSTEMS:  Review of Systems  Constitutional:  Positive for chills. Negative for appetite change, fatigue, fever and unexpected weight change.  HENT:   Negative for lump/mass, mouth sores and sore throat.   Respiratory:  Negative for cough and shortness of breath.   Cardiovascular:  Negative for chest pain and leg swelling.  Gastrointestinal:  Negative for abdominal pain, constipation, diarrhea, nausea and vomiting.  Endocrine: Negative for hot flashes.  Genitourinary:  Positive for dysuria. Negative for difficulty urinating, frequency and hematuria.   Musculoskeletal:  Negative for arthralgias, back pain and myalgias.  Skin:  Negative for rash.  Neurological:  Negative for dizziness and headaches.  Hematological:  Negative for adenopathy. Does not bruise/bleed easily.  Psychiatric/Behavioral:  Negative for depression and sleep disturbance. The patient is not nervous/anxious.      VITALS:  Blood pressure 115/84, pulse 100, temperature (!) 96.8 F (36 C), temperature source Tympanic, resp. rate 20, height $RemoveBe'5\' 4"'OanxdFkiy$  (1.626 m), weight 190 lb (86.2 kg), last menstrual period 09/02/1998, SpO2 98 %.  Wt Readings from Last 3 Encounters:  08/29/21 190 lb (86.2 kg)  08/22/21 187 lb 4.8 oz (85 kg)  08/16/21 188 lb 0.6 oz (85.3 kg)    Body mass index is 32.61 kg/m.  Performance status (ECOG): 2 - Symptomatic, <50% confined to bed  PHYSICAL EXAM:  Physical Exam Vitals and nursing note reviewed.  Constitutional:      General: She is not in acute distress.    Appearance: Normal appearance.  HENT:     Head: Normocephalic and atraumatic.     Mouth/Throat:     Mouth: Mucous membranes are moist.     Pharynx: Oropharynx is clear. No oropharyngeal exudate or posterior oropharyngeal erythema.  Eyes:     General: No scleral icterus.    Extraocular Movements: Extraocular movements intact.     Conjunctiva/sclera: Conjunctivae normal.     Pupils: Pupils are equal, round, and reactive to  light.  Cardiovascular:     Rate and Rhythm: Normal rate and regular rhythm.     Heart sounds: Normal heart sounds. No murmur heard.    No friction rub. No gallop.  Pulmonary:     Effort: Pulmonary effort is normal.     Breath sounds: Normal breath sounds. No wheezing, rhonchi or rales.  Abdominal:     General: There is no distension.     Palpations: Abdomen is soft. There is no hepatomegaly, splenomegaly or mass.     Tenderness: There is no abdominal tenderness. There is no right CVA tenderness or left CVA tenderness.  Musculoskeletal:        General: Normal range of motion.     Cervical back: Normal range of motion and neck supple. No tenderness.     Right  lower leg: No edema.     Left lower leg: No edema.  Lymphadenopathy:     Cervical: No cervical adenopathy.     Upper Body:     Right upper body: No supraclavicular or axillary adenopathy.     Left upper body: No supraclavicular or axillary adenopathy.     Lower Body: No right inguinal adenopathy. No left inguinal adenopathy.  Skin:    General: Skin is warm and dry.     Coloration: Skin is not jaundiced.     Findings: No rash.  Neurological:     Mental Status: She is alert and oriented to person, place, and time.     Cranial Nerves: No cranial nerve deficit.  Psychiatric:        Mood and Affect: Mood normal.        Behavior: Behavior normal.        Thought Content: Thought content normal.     LABS:      Latest Ref Rng & Units 08/29/2021   12:00 AM 08/22/2021   12:00 AM 08/15/2021   12:00 AM  CBC  WBC  3.8     2.6     4.1      Hemoglobin 12.0 - 16.0 13.4     13.3     13.3      Hematocrit 36 - 46 39     39     38      Platelets 150 - 400 K/uL 318     334     266         This result is from an external source.      Latest Ref Rng & Units 08/29/2021   12:00 AM 08/22/2021   12:00 AM 08/15/2021   12:00 AM  CMP  BUN 4 - $R'21 12     14     16      'PO$ Creatinine 0.5 - 1.1 0.6     0.8     0.7      Sodium 137 - 147 137      135     136      Potassium 3.5 - 5.1 mEq/L 4.2     4.2     4.1      Chloride 99 - 108 105     100     103      CO2 13 - $Re'22 24     26     24      'jMw$ Calcium 8.7 - 10.7 10.0     9.6     10.8      Alkaline Phos 25 - 125 54     57     52      AST 13 - 35 42     32     34      ALT 7 - 35 U/L 38     37     33         This result is from an external source.     No results found for: "CEA1", "CEA" / No results found for: "CEA1", "CEA" No results found for: "PSA1" No results found for: "FMB846" No results found for: "CAN125"  No results found for: "TOTALPROTELP", "ALBUMINELP", "A1GS", "A2GS", "BETS", "BETA2SER", "GAMS", "MSPIKE", "SPEI" No results found for: "TIBC", "FERRITIN", "IRONPCTSAT" No results found for: "LDH"  STUDIES:  No results found.    HISTORY:   Past Medical History:  Diagnosis Date  Abnormal mammogram of left breast 11/05/2018   Allergic rhinitis 08/30/2013   Altered consciousness 09/29/2020   Arthritis    Atypical ductal hyperplasia of right breast 05/21/2017   Cardiac murmur 09/23/2019   Chronic kidney disease    recurrent kidney infections   Coccydynia 03/24/2015   Degeneration of thoracic or thoracolumbar intervertebral disc 02/12/2013   Depression with anxiety 08/30/2013   Diet-controlled diabetes mellitus ([place]) 10/31/2019   Difficulty in walking 08/30/2013   Disorder of sacrum 02/12/2013   Dyspnea on exertion 10/29/2020   Epilepsy ([place]) 08/30/2013   Excessive daytime sleepiness 07/03/2017   Family history of breast cancer    Family history of colon cancer    Family history of first degree relative with dementia 10/26/2017   Family history of lung cancer    Family history of prostate cancer    Family history of uterine cancer    Fibromyalgia    Gait abnormality 07/10/2012   Genetic testing 03/09/2021   Negative genetic testing. No pathogenic variants identified on the [facility] Multi-Cancer+RNA panel. The report date is 03/08/2021.  The Multi-Cancer Panel +  RNA offered by [facility] includes sequencing and/or deletion duplication testing of the following 84 genes: AIP, ALK, APC, ATM, AXIN2,BAP1,  BARD1, BLM, BMPR1A, BRCA1, BRCA2, BRIP1, CASR, CDC73, CDH1, CDK4, CDKN1B, CDKN1C, CDKN2A (p14ARF), CDKN2A (   Herpes zoster with nervous system complications 24/58/0998   History of seizure disorder 03/17/2019   Hypercholesteremia    Hyperlipidemia 08/30/2013   IBS (irritable bowel syndrome)    Insomnia 09/07/2015   Malignant neoplasm of central portion of left breast in female, estrogen receptor positive ([place]) 10/26/2020   Mass of upper outer quadrant of left breast 05/21/2017   Medication management 01/31/2019   Migraine headache 08/30/2013   Mild cognitive impairment 03/17/2019   Muscle weakness (generalized) 07/25/2021   Myalgia and myositis 02/12/2013   Neurogenic bladder 05/21/2020   Neuropathic pain 07/22/2020   Neuropathy 03/17/2019   Obesity, Class I, BMI 30-34.9 03/17/2019   Obstructive sleep apnea (adult) (pediatric) 08/30/2013   Pain in thoracic spine 02/12/2013   Palpitations 09/23/2019   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   Postlaminectomy syndrome, lumbar region 02/12/2013   Postoperative examination 06/19/2017   Postural hypotension 09/23/2019   Recurrent urinary tract infection 08/29/2021   Restless legs syndrome 05/22/2017   Risk for falls 09/07/2015   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   Seizures ([place])    Sleep apnea    nightly cpap   Spondylosis of thoracic region without myelopathy or radiculopathy 02/12/2013   Thoracic or lumbosacral neuritis or radiculitis 02/12/2013   Thoracic spondylosis with myelopathy 02/12/2013   Tremors of nervous system    Urinary incontinence 03/17/2019   Urinary incontinence, mixed 05/29/2013   Vitamin B 12 deficiency 03/17/2019   Vitamin D deficiency 03/17/2019   Weakness 07/10/2012    Past Surgical History:  Procedure Laterality Date    BLADDER REPAIR  03/13/2018   BLADDER SUSPENSION  03/13/2018   BREAST LUMPECTOMY  2019   COLONOSCOPY  12/20/2012   Left colonic diverticulosis predominantly in the sigmoid colon. Small internal hemmorhoids. Otherwise normal colonoscopy.    LAMINECTOMY  1998    Family History  Problem Relation Age of Onset   Hypercholesterolemia Mother    Alzheimer's disease Mother    Heart Problems Father    Clotting disorder Father    Alzheimer's disease Father    Prostate cancer Father        dx 88s  Allergic rhinitis Sister    Uterine cancer Maternal Aunt        dx 18s, d. 75   Breast cancer Maternal Aunt 60       recurrence, metastatic, d. 75   Hemophilia Maternal Aunt    Breast cancer Maternal Aunt        dx 4-s d. 31s   Lung cancer Maternal Uncle 50       smoker   Lung cancer Maternal Uncle        60s   Breast cancer Paternal Aunt 7       dx 70s d. 12, 3 recurrences   Liver cancer Paternal Uncle    Kidney cancer Paternal Uncle    Colon cancer Maternal Grandfather        49s   Brain cancer Paternal Grandfather        65s   Colon cancer Cousin    Breast cancer Cousin        dx 30s, bilateral mastectomy   Esophageal cancer Neg Hx    Stomach cancer Neg Hx    Rectal cancer Neg Hx     Social History:  reports that she has never smoked. She has never used smokeless tobacco. She reports that she does not drink alcohol and does not use drugs.The patient is accompanied by her daughter today.  Allergies:  Allergies  Allergen Reactions   Nitrofurantoin Anaphylaxis   Sulfa Antibiotics Rash, Other (See Comments) and Hives    Other reaction(s): Rash     Current Medications: Current Outpatient Medications  Medication Sig Dispense Refill   HYDROcodone-acetaminophen (NORCO/VICODIN) 5-325 MG tablet Take by mouth.     dexamethasone (DECADRON) 4 MG tablet Take 3 tabs at the night before and 3 tab the morning of chemotherapy 60 tablet 0   dicyclomine (BENTYL) 10 MG capsule Take 10 mg by  mouth 3 (three) times daily as needed for pain. With IBS     DULoxetine (CYMBALTA) 60 MG capsule Take 1 capsule by mouth at bedtime.     EPINEPHrine 0.3 mg/0.3 mL IJ SOAJ injection Inject 0.3 mg into the muscle as needed for anaphylaxis.     estradiol (ESTRACE) 0.1 MG/GM vaginal cream Place 1 Applicatorful vaginally at bedtime. 42.5 g 0   ezetimibe (ZETIA) 10 MG tablet Take 10 mg by mouth daily.     fenofibrate 160 MG tablet Take 160 mg by mouth daily.     gabapentin (NEURONTIN) 600 MG tablet Take 1,200 mg by mouth 3 (three) times daily.     HYDROcodone-acetaminophen (NORCO/VICODIN) 5-325 MG tablet Take by mouth.     levETIRAcetam (KEPPRA) 750 MG tablet Take 750 mg by mouth 2 (two) times daily.     lidocaine (LIDODERM) 5 % Place onto the skin.     LINZESS 72 MCG capsule Take 1 capsule (72 mcg total) by mouth daily. 90 capsule 4   magic mouthwash (lidocaine, diphenhydrAMINE, alum & mag hydroxide) suspension Take 5 mLs by mouth every 3 (three) hours as needed. Swish and spit for mouth or throat pain     metoprolol succinate (TOPROL-XL) 25 MG 24 hr tablet Take 25 mg by mouth daily.     midodrine (PROAMATINE) 5 MG tablet Take 5 mg by mouth 3 (three) times daily with meals.     mirabegron ER (MYRBETRIQ) 50 MG TB24 tablet Take 1 tablet by mouth daily.     omeprazole (PRILOSEC OTC) 20 MG tablet Take 1 tablet by mouth daily.     ondansetron (  ZOFRAN) 8 MG tablet Take 1 tablet (8 mg total) by mouth 2 (two) times daily as needed. Start on the third day after chemotherapy. 30 tablet 1   pramipexole (MIRAPEX) 1 MG tablet Take 1 mg by mouth at bedtime. Takes 1/2 tab     prochlorperazine (COMPAZINE) 10 MG tablet Take 1 tablet (10 mg total) by mouth every 6 (six) hours as needed (Nausea or vomiting). 30 tablet 1   ramelteon (ROZEREM) 8 MG tablet Take 8 mg by mouth at bedtime.     Vitamin D, Ergocalciferol, (DRISDOL) 1.25 MG (50000 UT) CAPS capsule TAKE 1 CAPSULE BY MOUTH ONCE A WEEK FOR 30 DAYS     vitamin E  1000 UNIT capsule Take 1,000 Units by mouth daily.     zinc gluconate 50 MG tablet Take 50 mg by mouth daily.     No current facility-administered medications for this visit.   Facility-Administered Medications Ordered in Other Visits  Medication Dose Route Frequency Provider Last Rate Last Admin   0.9 %  sodium chloride infusion   Intravenous Once [name], MD   Stopped at 08/22/21 1505   sodium chloride flush (NS) 0.9 % injection 10 mL  10 mL Intracatheter PRN [name], MD   10 mL at 08/22/21 1355

## 2021-08-29 NOTE — Assessment & Plan Note (Addendum)
History of recurrent UTIs.  Her last infection was in June with Enterococcus faecalis was treated with ampicillin.  In March she had Klebsiella pneumoniae treated with levofloxacin.  Urinalysis today is suspicious for recurrent UTI.  As she has had different organisms in the past, I will await culture prior to starting prior to starting antibiotic.

## 2021-08-30 ENCOUNTER — Other Ambulatory Visit: Payer: Self-pay | Admitting: Oncology

## 2021-08-30 ENCOUNTER — Other Ambulatory Visit: Payer: Self-pay

## 2021-08-30 ENCOUNTER — Inpatient Hospital Stay: Payer: Medicare Other

## 2021-08-30 VITALS — BP 106/74 | HR 104 | Temp 98.3°F | Resp 18 | Ht 64.0 in | Wt 188.1 lb

## 2021-08-30 DIAGNOSIS — Z5111 Encounter for antineoplastic chemotherapy: Secondary | ICD-10-CM | POA: Diagnosis not present

## 2021-08-30 DIAGNOSIS — C50112 Malignant neoplasm of central portion of left female breast: Secondary | ICD-10-CM

## 2021-08-30 MED ORDER — SODIUM CHLORIDE 0.9% FLUSH
10.0000 mL | INTRAVENOUS | Status: DC | PRN
  Administered 2021-08-30: 10 mL

## 2021-08-30 MED ORDER — SODIUM CHLORIDE 0.9 % IV SOLN
10.0000 mg | Freq: Once | INTRAVENOUS | Status: AC
  Administered 2021-08-30: 10 mg via INTRAVENOUS
  Filled 2021-08-30: qty 10

## 2021-08-30 MED ORDER — FAMOTIDINE IN NACL 20-0.9 MG/50ML-% IV SOLN
20.0000 mg | Freq: Once | INTRAVENOUS | Status: AC
  Administered 2021-08-30: 20 mg via INTRAVENOUS
  Filled 2021-08-30: qty 50

## 2021-08-30 MED ORDER — HEPARIN SOD (PORK) LOCK FLUSH 100 UNIT/ML IV SOLN
500.0000 [IU] | Freq: Once | INTRAVENOUS | Status: AC | PRN
  Administered 2021-08-30: 500 [IU]

## 2021-08-30 MED ORDER — DIPHENHYDRAMINE HCL 50 MG/ML IJ SOLN
25.0000 mg | Freq: Once | INTRAMUSCULAR | Status: AC
  Administered 2021-08-30: 25 mg via INTRAVENOUS
  Filled 2021-08-30: qty 1

## 2021-08-30 MED ORDER — SODIUM CHLORIDE 0.9 % IV SOLN
63.0000 mg/m2 | Freq: Once | INTRAVENOUS | Status: AC
  Administered 2021-08-30: 126 mg via INTRAVENOUS
  Filled 2021-08-30: qty 21

## 2021-08-30 MED ORDER — SODIUM CHLORIDE 0.9 % IV SOLN: Freq: Once | INTRAVENOUS | Status: AC

## 2021-08-30 NOTE — Patient Instructions (Signed)
[facility]  Discharge Instructions: Thank you for choosing [facility] to provide your oncology and hematology care.  If you have a lab appointment with the [facility], please go directly to the [facility] and check in at the registration area.   Wear comfortable clothing and clothing appropriate for easy access to any Portacath or PICC line.   We strive to give you quality time with your provider. You may need to reschedule your appointment if you arrive late (15 or more minutes).  Arriving late affects you and other patients whose appointments are after yours.  Also, if you miss three or more appointments without notifying the office, you may be dismissed from the clinic at the provider's discretion.      For prescription refill requests, have your pharmacy contact our office and allow 72 hours for refills to be completed.    Today you received the following chemotherapy and/or immunotherapy agents      To help prevent nausea and vomiting after your treatment, we encourage you to take your nausea medication as directed.  BELOW ARE SYMPTOMS THAT SHOULD BE REPORTED IMMEDIATELY: *FEVER GREATER THAN 100.4 F (38 C) OR HIGHER *CHILLS OR SWEATING *NAUSEA AND VOMITING THAT IS NOT CONTROLLED WITH YOUR NAUSEA MEDICATION *UNUSUAL SHORTNESS OF BREATH *UNUSUAL BRUISING OR BLEEDING *URINARY PROBLEMS (pain or burning when urinating, or frequent urination) *BOWEL PROBLEMS (unusual diarrhea, constipation, pain near the anus) TENDERNESS IN MOUTH AND THROAT WITH OR WITHOUT PRESENCE OF ULCERS (sore throat, sores in mouth, or a toothache) UNUSUAL RASH, SWELLING OR PAIN  UNUSUAL VAGINAL DISCHARGE OR ITCHING   Items with * indicate a potential emergency and should be followed up as soon as possible or go to the Emergency Department if any problems should occur.  Please show the CHEMOTHERAPY ALERT CARD or IMMUNOTHERAPY ALERT CARD at check-in to the Emergency  Department and triage nurse.  Should you have questions after your visit or need to cancel or reschedule your appointment, please contact [facility]  Dept: [phone]  and follow the prompts.  Office hours are 8:00 a.m. to 4:30 p.m. Monday - Friday. Please note that voicemails left after 4:00 p.m. may not be returned until the following business day.  We are closed weekends and major holidays. You have access to a nurse at all times for urgent questions. Please call the main number to the clinic Dept: [phone] and follow the prompts.  For any non-urgent questions, you may also contact your provider using MyChart. We now offer e-Visits for anyone 65 and older to request care online for non-urgent symptoms. For details visit [email or web address].   Also download the MyChart app! Go to the app store, search "MyChart", open the app, select [facility], and log in with your MyChart username and password.  Masks are optional in the cancer centers. If you would like for your care team to wear a mask while they are taking care of you, please let them know. You may have one support person who is at least 65 years old accompany you for your appointments. Paclitaxel Injection What is this medication? PACLITAXEL (PAK li TAX el) treats some types of cancer. It works by slowing down the growth of cancer cells. This medicine may be used for other purposes; ask your health care provider or pharmacist if you have questions. COMMON BRAND NAME(S): Onxol, Taxol What should I tell my care team before I take this medication? They need to  know if you have any of these conditions: Heart disease Liver disease Low white blood cell levels An unusual or allergic reaction to paclitaxel, other medications, foods, dyes, or preservatives If you or your partner are pregnant or trying to get pregnant Breast-feeding How should I use this medication? This medication is injected into a vein. It  is given by your care team in a hospital or clinic setting. Talk to your care team about the use of this medication in children. While it may be given to children for selected conditions, precautions do apply. Overdosage: If you think you have taken too much of this medicine contact a poison control center or emergency room at once. NOTE: This medicine is only for you. Do not share this medicine with others. What if I miss a dose? Keep appointments for follow-up doses. It is important not to miss your dose. Call your care team if you are unable to keep an appointment. What may interact with this medication? Do not take this medication with any of the following: Live virus vaccines Other medications may affect the way this medication works. Talk with your care team about all of the medications you take. They may suggest changes to your treatment plan to lower the risk of side effects and to make sure your medications work as intended. This list may not describe all possible interactions. Give your health care provider a list of all the medicines, herbs, non-prescription drugs, or dietary supplements you use. Also tell them if you smoke, drink alcohol, or use illegal drugs. Some items may interact with your medicine. What should I watch for while using this medication? Your condition will be monitored carefully while you are receiving this medication. You may need blood work while taking this medication. This medication may make you feel generally unwell. This is not uncommon as chemotherapy can affect healthy cells as well as cancer cells. Report any side effects. Continue your course of treatment even though you feel ill unless your care team tells you to stop. This medication can cause serious allergic reactions. To reduce the risk, your care team may give you other medications to take before receiving this one. Be sure to follow the directions from your care team. This medication may increase your  risk of getting an infection. Call your care team for advice if you get a fever, chills, sore throat, or other symptoms of a cold or flu. Do not treat yourself. Try to avoid being around people who are sick. This medication may increase your risk to bruise or bleed. Call your care team if you notice any unusual bleeding. Be careful brushing or flossing your teeth or using a toothpick because you may get an infection or bleed more easily. If you have any dental work done, tell your dentist you are receiving this medication. Talk to your care team if you may be pregnant. Serious birth defects can occur if you take this medication during pregnancy. Talk to your care team before breastfeeding. Changes to your treatment plan may be needed. What side effects may I notice from receiving this medication? Side effects that you should report to your care team as soon as possible: Allergic reactions--skin rash, itching, hives, swelling of the face, lips, tongue, or throat Heart rhythm changes--fast or irregular heartbeat, dizziness, feeling faint or lightheaded, chest pain, trouble breathing Increase in blood pressure Infection--fever, chills, cough, sore throat, wounds that don't heal, pain or trouble when passing urine, general feeling of discomfort or being unwell  Low blood pressure--dizziness, feeling faint or lightheaded, blurry vision Low red blood cell level--unusual weakness or fatigue, dizziness, headache, trouble breathing Painful swelling, warmth, or redness of the skin, blisters or sores at the infusion site Pain, tingling, or numbness in the hands or feet Slow heartbeat--dizziness, feeling faint or lightheaded, confusion, trouble breathing, unusual weakness or fatigue Unusual bruising or bleeding Side effects that usually do not require medical attention (report to your care team if they continue or are bothersome): Diarrhea Hair loss Joint pain Loss of appetite Muscle  pain Nausea Vomiting This list may not describe all possible side effects. Call your doctor for medical advice about side effects. You may report side effects to FDA at 1-800-FDA-1088. Where should I keep my medication? This medication is given in a hospital or clinic. It will not be stored at home. NOTE: This sheet is a summary. It may not cover all possible information. If you have questions about this medicine, talk to your doctor, pharmacist, or health care provider.  2023 [employer]vier/Gold Standard (2021-05-05 00:00:00)

## 2021-08-31 ENCOUNTER — Telehealth: Payer: Self-pay

## 2021-08-31 ENCOUNTER — Other Ambulatory Visit: Payer: Self-pay | Admitting: Hematology and Oncology

## 2021-08-31 MED ORDER — CIPROFLOXACIN HCL 500 MG PO TABS: 500.0000 mg | ORAL_TABLET | Freq: Two times a day (BID) | ORAL | 0 refills | Status: DC

## 2021-08-31 NOTE — Telephone Encounter (Signed)
-----   Message from Marvia Pickles, PA-C sent at 08/31/2021  8:14 AM EDT ----- Please let her know asap I sent in Cipro for her UTI. The sensitivities weren't back last night. Thanks

## 2021-08-31 NOTE — Progress Notes (Signed)
Urine culture positive for E. Coli sensitive to fluoroquinolones. Will Rx Cipro.

## 2021-08-31 NOTE — Telephone Encounter (Signed)
Patient notified

## 2021-09-04 ENCOUNTER — Other Ambulatory Visit: Payer: Self-pay | Admitting: Pharmacist

## 2021-09-04 DIAGNOSIS — C50112 Malignant neoplasm of central portion of left female breast: Secondary | ICD-10-CM

## 2021-09-06 ENCOUNTER — Inpatient Hospital Stay: Payer: Medicare Other | Attending: Oncology | Admitting: Hematology and Oncology

## 2021-09-06 ENCOUNTER — Encounter: Payer: Self-pay | Admitting: Hematology and Oncology

## 2021-09-06 ENCOUNTER — Inpatient Hospital Stay: Payer: Medicare Other

## 2021-09-06 ENCOUNTER — Other Ambulatory Visit: Payer: Self-pay | Admitting: Pharmacist

## 2021-09-06 DIAGNOSIS — Z170 Estrogen receptor positive status [ER+]: Secondary | ICD-10-CM | POA: Insufficient documentation

## 2021-09-06 DIAGNOSIS — C773 Secondary and unspecified malignant neoplasm of axilla and upper limb lymph nodes: Secondary | ICD-10-CM | POA: Insufficient documentation

## 2021-09-06 DIAGNOSIS — C50112 Malignant neoplasm of central portion of left female breast: Secondary | ICD-10-CM

## 2021-09-06 DIAGNOSIS — Z79899 Other long term (current) drug therapy: Secondary | ICD-10-CM | POA: Insufficient documentation

## 2021-09-06 DIAGNOSIS — Z5111 Encounter for antineoplastic chemotherapy: Secondary | ICD-10-CM | POA: Insufficient documentation

## 2021-09-06 LAB — BASIC METABOLIC PANEL
BUN: 14 (ref 4–21)
CO2: 24 — AB (ref 13–22)
Chloride: 103 (ref 99–108)
Creatinine: 0.8 (ref 0.5–1.1)
Glucose: 156
Potassium: 3.9 mEq/L (ref 3.5–5.1)
Sodium: 138 (ref 137–147)

## 2021-09-06 LAB — COMPREHENSIVE METABOLIC PANEL
Albumin: 4.6 (ref 3.5–5.0)
Calcium: 10.1 (ref 8.7–10.7)

## 2021-09-06 LAB — CBC AND DIFFERENTIAL
HCT: 39 (ref 36–46)
Hemoglobin: 13.4 (ref 12.0–16.0)
Neutrophils Absolute: 3.3
Platelets: 305 10*3/uL (ref 150–400)
WBC: 5.9

## 2021-09-06 LAB — CBC: RBC: 4.11 (ref 3.87–5.11)

## 2021-09-06 LAB — HEPATIC FUNCTION PANEL
ALT: 42 U/L — AB (ref 7–35)
AST: 40 — AB (ref 13–35)
Alkaline Phosphatase: 52 (ref 25–125)
Bilirubin, Total: 0.7

## 2021-09-06 MED FILL — Paclitaxel IV Conc 300 MG/50ML (6 MG/ML): INTRAVENOUS | Qty: 21 | Status: AC

## 2021-09-06 MED FILL — Dexamethasone Sodium Phosphate Inj 100 MG/10ML: INTRAMUSCULAR | Qty: 1 | Status: AC

## 2021-09-06 NOTE — Progress Notes (Signed)
Per Vida Roller - Patient requests to stop paclitaxel after 09/07/21 dose.  Orders have been deleted.

## 2021-09-06 NOTE — Assessment & Plan Note (Signed)
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 Shewas treated withleft mastectomy. One node was positive for malignancy. EndoPredict has revealed her to be at extremely high riskfor recurrence,with an EP clin score of 5.4.Her risk of recurrence in the next 10 years is 55% and she has an estimated chemotherapy benefit of 27%. She completed 4 cycles of AC chemotherapy in May. She is now receiving weekly paclitaxel. During cycle 9, she began to have seizure activity during her pre-meds and she was transported to the hospital via EMS. She was hospitalized, later transferred and treated for meningitis. She was discharged and seen on July 10 to restart therapy.  She remained fatigued and did not remember everything from her admission. She denied any residual symptoms.  She received a 9th cycle of paclitaxel on July 18.  She then had increased general muscle weakness since receiving paclitaxel.  She still described episodes of shakiness.  CT head was negative. Keppra was increased to 750 mg twice daily, as this was felt to possibly represent seizure activity.  Paclitaxel was held again.  She resumed paclitaxel on August 8 and received 2 weekly cycles, then paclitaxel was held again due to worsening fatigue and pain.  She continues to report significant fatigue and generalized weakness, as well as worsening neuropathy.  She does not wish to continue paclitaxel after her treatment tomorrow, which would be 9 cycles of weekly paclitaxel with much interruption.  We will plan to see her back in 3 weeks for repeat clinical assessment.

## 2021-09-06 NOTE — Progress Notes (Signed)
[facility]  [address] 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 She was treated with left mastectomy.  One node was positive for malignancy.  EndoPredict has revealed her to be at extremely high risk for recurrence, with an EP clin score of 5.4.  Her risk of recurrence in the next 10 years is 55% and she has an estimated chemotherapy benefit of 27%.  She completed 4 cycles of AC chemotherapy in May.  She is now receiving weekly paclitaxel. During cycle 9, she began to have seizure activity during her pre-meds and she was transported to the hospital via EMS. She was hospitalized, later transferred and treated for meningitis. She was discharged and seen on July 10 to restart therapy.  She remained fatigued and did not remember everything from her admission. She denied any residual symptoms.  She received a 9th cycle of paclitaxel on July 18.  She then had increased general muscle weakness since receiving paclitaxel.  She still described episodes of shakiness.  CT head was negative. Keppra was increased to 750 mg twice daily, as this was felt to possibly represent seizure activity.  Paclitaxel was held again.  She resumed paclitaxel on August 8 and received 2 weekly cycles, then paclitaxel was held again due to worsening fatigue and pain.  She continues to report significant fatigue and generalized weakness, as well as worsening neuropathy.  She does not wish to continue paclitaxel after her treatment tomorrow, which would be 9 cycles of weekly paclitaxel with much interruption.  We will plan to see her back in 3 weeks for repeat clinical  assessment.   The patient understands the plans discussed today and is in agreement with them.  She knows to contact our office if she develops concerns prior to her next appointment.   I provided 20 minutes of face-to-face time during this encounter and > 50% was spent counseling as documented under my assessment and plan.    Marvia Pickles, PA-C  [facility] AT [facility] [address] [place] [address] Dept: [phone] Dept Fax: [phone]   Orders Placed This Encounter  Procedures   CBC and differential    This external order was created through the Results Console.   CBC    This external order was created through the Results Console.   Basic metabolic panel    This external order was created through the Results Console.   Comprehensive metabolic panel    This external order was created through the Results Console.   Hepatic function panel    This external order was created through the Results Console.      CHIEF COMPLAINT:  CC: Stage IIA hormone receptor positive breast cancer  Current Treatment:  Adjuvant weekly paclitaxel  HISTORY OF PRESENT ILLNESS:   Oncology History  Malignant neoplasm of central portion of left breast in female, estrogen receptor positive ([place])  09/16/2020 Mammogram   DIGITAL SCREENING BILATERAL MAMMOGRAM: In the left breast, a possible mass warrants further evaluation. In the right breast, no findings suspicious for malignancy.    10/08/2020 Mammogram   DIAGNOSTIC UNILATERAL LEFT MAMMOGRAM AND LEFT  BREAST ULTRASOUND: There is architectural distortion at the left breast 12 o'clock.  Targeted ultrasound is performed, showing no focal abnormal discrete cystic or solid lesion at left breast 12 o'clock the correlate to the mammographic finding. Ultrasound of the left axilla is negative.   10/20/2020 Pathology Results   Breast, left, needle core biopsy, 12 o'clock:             Invasive  mammary carcinoma             Mammary carcinoma in situ HER2: Negative (0) ER: Positive 30% PR: Negative 0% Ki67: 1%   10/26/2020 Initial Diagnosis   Malignant neoplasm of central portion of left breast in female, estrogen receptor positive ([place])   12/17/2020 Cancer Staging   Staging form: Breast, AJCC 8th Edition - Clinical stage from 12/17/2020: Stage IIA (cT1b, cN1(sn), cM0, G2, ER+, PR-, HER2-) - Signed by [name], MD on 02/13/2021 Histopathologic type: Infiltrating duct carcinoma, NOS Stage prefix: Initial diagnosis Method of lymph node assessment: Sentinel lymph node biopsy Nuclear grade: G2 Multigene prognostic tests performed: EndoPredict Histologic grading system: 3 grade system Laterality: Left Tumor size (mm): 7 Lymph-vascular invasion (LVI): LVI not present (absent)/not identified Diagnostic confirmation: Positive histology Specimen type: Excision Staged by: Managing physician Menopausal status: Postmenopausal Ki-67 (%): 1 EndoPredict EPclin risk score: 5.4 EndoPredict EPclin risk level: High risk EndoPredict 10-year percentage risk of distant recurrence (%): 55 EndoPredict 10-year risk of distant recurrence: High risk Stage used in treatment planning: Yes National guidelines used in treatment planning: Yes Type of national guideline used in treatment planning: NCCN Staging comments: Endopredict with 27% benefit from chemotherapy   12/17/2020 Pathology Results   Breast, simple mastectomy, left:         Invasive ductal carcinoma, grade 2, 7 mm, and ductal carcinoma in situ         Ribbon clip and biopsy reaction present         All margins negative for invasive carcinoma and DCIS         Fibrocystic changes Lymph node, sentinel, biopsy, left axillary:         Tumor present in regional lymph node (1/1)   02/18/2021 - 08/30/2021 Chemotherapy   Patient is on Treatment Plan : Breast AC q21 Days / PACLitaxel q7d      Genetic Testing   Negative genetic  testing. No pathogenic variants identified on the [facility] Multi-Cancer+RNA panel. The report date is 03/08/2021.  The Multi-Cancer Panel + RNA offered by [facility] includes sequencing and/or deletion duplication testing of the following 84 genes: AIP, ALK, APC, ATM, AXIN2,BAP1,  BARD1, BLM, BMPR1A, BRCA1, BRCA2, BRIP1, CASR, CDC73, CDH1, CDK4, CDKN1B, CDKN1C, CDKN2A (p14ARF), CDKN2A (p16INK4a), CEBPA, CHEK2, CTNNA1, DICER1, DIS3L2, EGFR (c.2369C>T, p.Thr790Met variant only), EPCAM (Deletion/duplication testing only), FH, FLCN, GATA2, GPC3, GREM1 (Promoter region deletion/duplication testing only), HOXB13 (c.251G>A, p.Gly84Glu), HRAS, KIT, MAX, MEN1, MET, MITF (c.952G>A, p.Glu318Lys variant only), MLH1, MSH2, MSH3, MSH6, MUTYH, NBN, NF1, NF2, NTHL1, PALB2, PDGFRA, PHOX2B, PMS2, POLD1, POLE, POT1, PRKAR1A, PTCH1, PTEN, RAD50, RAD51C, RAD51D, RB1, RECQL4, RET, RUNX1, SDHAF2, SDHA (sequence changes only), SDHB, SDHC, SDHD, SMAD4, SMARCA4, SMARCB1, SMARCE1, STK11, SUFU, TERC, TERT, TMEM127, TP53, TSC1, TSC2, VHL, WRN and WT1.   02/18/2021 -  Chemotherapy   Patient is on Treatment Plan : Breast AC q21 Days / PACLitaxel q7d         INTERVAL HISTORY:  Millie is here today for repeat clinical assessment and reports worsening generalized weakness and fatigue.  She occasionally has  shaking and diaphoresis when she tries to do too much.  She also reports worsening neuropathy with stinging and numbness.  She states her urinary symptoms are resolving with antibiotics. She denies fevers or chills. She denies pain. Her appetite is good. Her weight has decreased 2 pounds over last week . She states she has a follow up with neurology this month,  REVIEW OF SYSTEMS:  Review of Systems  Constitutional:  Positive for fatigue. Negative for appetite change, chills, fever and unexpected weight change.  HENT:   Negative for lump/mass, mouth sores and sore throat.   Respiratory:  Negative for cough and shortness of breath.    Cardiovascular:  Negative for chest pain and leg swelling.  Gastrointestinal:  Negative for abdominal pain, constipation, diarrhea, nausea and vomiting.  Endocrine: Negative for hot flashes.  Genitourinary:  Negative for difficulty urinating, dysuria, frequency and hematuria.   Musculoskeletal:  Negative for arthralgias, back pain and myalgias.  Skin:  Negative for rash.  Neurological:  Positive for extremity weakness (generalized) and numbness (bilateral feet). Negative for dizziness and headaches.  Hematological:  Negative for adenopathy. Does not bruise/bleed easily.  Psychiatric/Behavioral:  Negative for depression and sleep disturbance. The patient is not nervous/anxious.      VITALS:  Blood pressure 119/68, pulse (!) 121, temperature 98.4 F (36.9 C), temperature source Oral, resp. rate 20, height _0  (1.626 m), weight 186 lb 6.4 oz (84.6 kg), last menstrual period 09/02/1998, SpO2 94 %.  Wt Readings from Last 3 Encounters:  09/06/21 186 lb 6.4 oz (84.6 kg)  08/30/21 188 lb 1.9 oz (85.3 kg)  08/29/21 190 lb (86.2 kg)    Body mass index is 32 kg/m.  Performance status (ECOG): 2 - Symptomatic, <50% confined to bed  PHYSICAL EXAM:  Physical Exam  LABS:      Latest Ref Rng & Units 09/06/2021   12:00 AM 08/29/2021   12:00 AM 08/22/2021   12:00 AM  CBC  WBC  5.9     3.8     2.6      Hemoglobin 12.0 - 16.0 13.4     13.4     13.3      Hematocrit 36 - 46 39     39     39      Platelets 150 - 400 K/uL 305     318     334         This result is from an external source.      Latest Ref Rng & Units 09/06/2021   12:00 AM 08/29/2021   12:00 AM 08/22/2021   12:00 AM  CMP  BUN 4 - _1 Creatinine 0.5 - 1.1 0.8     0.6     0.8      Sodium 137 - 147 138     137     135      Potassium 3.5 - 5.1 mEq/L 3.9     4.2     4.2      Chloride 99 - 108 103     105     100      CO2 13 - _2 Calcium 8.7 - 10.7 10.1     10.0     9.6      Alkaline Phos  25 -  125 52     54     57      AST 13 - 35 40     42     32      ALT 7 - 35 U/L 42     38     37         This result is from an external source.     No results found for: "CEA1", "CEA" / No results found for: "CEA1", "CEA" No results found for: "PSA1" No results found for: "JKD326" No results found for: "CAN125"  No results found for: "TOTALPROTELP", "ALBUMINELP", "A1GS", "A2GS", "BETS", "BETA2SER", "GAMS", "MSPIKE", "SPEI" No results found for: "TIBC", "FERRITIN", "IRONPCTSAT" No results found for: "LDH"  STUDIES:  No results found.    HISTORY:   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 02/12/2013   Depression with anxiety 08/30/2013   Diet-controlled diabetes mellitus ([place]) 10/31/2019   Difficulty in walking 08/30/2013   Disorder of sacrum 02/12/2013   Dyspnea on exertion 10/29/2020   Epilepsy ([place]) 08/30/2013   Excessive daytime sleepiness 07/03/2017   Family history of breast cancer    Family history of colon cancer    Family history of first degree relative with dementia 10/26/2017   Family history of lung cancer    Family history of prostate cancer    Family history of uterine cancer    Fibromyalgia    Gait abnormality 07/10/2012   Genetic testing 03/09/2021   Negative genetic testing. No pathogenic variants identified on the [facility] Multi-Cancer+RNA panel. The report date is 03/08/2021.  The Multi-Cancer Panel + RNA offered by [facility] includes sequencing and/or deletion duplication testing of the following 84 genes: AIP, ALK, APC, ATM, AXIN2,BAP1,  BARD1, BLM, BMPR1A, BRCA1, BRCA2, BRIP1, CASR, CDC73, CDH1, CDK4,  CDKN1B, CDKN1C, CDKN2A (p14ARF), CDKN2A (   Herpes zoster with nervous system complications 71/24/5809   History of seizure disorder 03/17/2019   Hypercholesteremia    Hyperlipidemia 08/30/2013   IBS (irritable bowel syndrome)    Insomnia 09/07/2015   Malignant neoplasm of central portion of left breast in female, estrogen receptor positive ([place]) 10/26/2020   Mass of upper outer quadrant of left breast 05/21/2017   Medication management 01/31/2019   Migraine headache 08/30/2013   Mild cognitive impairment 03/17/2019   Muscle weakness (generalized) 07/25/2021   Myalgia and myositis 02/12/2013   Neurogenic bladder 05/21/2020   Neuropathic pain 07/22/2020   Neuropathy 03/17/2019   Obesity, Class I, BMI 30-34.9 03/17/2019   Obstructive sleep apnea (adult) (pediatric) 08/30/2013   Pain in thoracic spine 02/12/2013   Palpitations 09/23/2019   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   Postlaminectomy syndrome, lumbar region 02/12/2013   Postoperative examination 06/19/2017   Postural hypotension 09/23/2019   Recurrent urinary tract infection 08/29/2021   Restless legs syndrome 05/22/2017   Risk for falls 09/07/2015   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   Seizures ([place])    Sleep apnea    nightly cpap   Spondylosis of thoracic region without myelopathy or radiculopathy 02/12/2013   Thoracic or lumbosacral neuritis or radiculitis 02/12/2013   Thoracic spondylosis with myelopathy 02/12/2013  Tremors of nervous system    Urinary incontinence 03/17/2019   Urinary incontinence, mixed 05/29/2013   Vitamin B 12 deficiency 03/17/2019   Vitamin D deficiency 03/17/2019   Weakness 07/10/2012    Past Surgical History:  Procedure Laterality Date   BLADDER REPAIR  03/13/2018   BLADDER SUSPENSION  03/13/2018   BREAST LUMPECTOMY  2019   COLONOSCOPY  12/20/2012   Left colonic diverticulosis predominantly in the sigmoid colon. Small internal hemmorhoids.  Otherwise normal colonoscopy.    LAMINECTOMY  1998    Family History  Problem Relation Age of Onset   Hypercholesterolemia Mother    Alzheimer's disease Mother    Heart Problems Father    Clotting disorder Father    Alzheimer's disease Father    Prostate cancer Father        dx 23s   Allergic rhinitis Sister    Uterine cancer Maternal Aunt        dx 39s, d. 104   Breast cancer Maternal Aunt 60       recurrence, metastatic, d. 18   Hemophilia Maternal Aunt    Breast cancer Maternal Aunt        dx 4-s d. 47s   Lung cancer Maternal Uncle 50       smoker   Lung cancer Maternal Uncle        71s   Breast cancer Paternal Aunt 13       dx 58s d. 60, 3 recurrences   Liver cancer Paternal Uncle    Kidney cancer Paternal Uncle    Colon cancer Maternal Grandfather        70s   Brain cancer Paternal Grandfather        63s   Colon cancer Cousin    Breast cancer Cousin        dx 65s, bilateral mastectomy   Esophageal cancer Neg Hx    Stomach cancer Neg Hx    Rectal cancer Neg Hx     Social History:  reports that she has never smoked. She has never used smokeless tobacco. She reports that she does not drink alcohol and does not use drugs.The patient is accompanied by her daughter today.  Allergies:  Allergies  Allergen Reactions   Nitrofurantoin Anaphylaxis   Sulfa Antibiotics Rash, Other (See Comments) and Hives    Other reaction(s): Rash     Current Medications: Current Outpatient Medications  Medication Sig Dispense Refill   dexamethasone (DECADRON) 4 MG tablet Take 3 tabs at the night before and 3 tab the morning of chemotherapy 60 tablet 0   dicyclomine (BENTYL) 10 MG capsule Take 10 mg by mouth 3 (three) times daily as needed for pain. With IBS     DULoxetine (CYMBALTA) 60 MG capsule Take 1 capsule by mouth at bedtime.     EPINEPHrine 0.3 mg/0.3 mL IJ SOAJ injection Inject 0.3 mg into the muscle as needed for anaphylaxis.     estradiol (ESTRACE) 0.1 MG/GM vaginal cream  Place 1 Applicatorful vaginally at bedtime. 42.5 g 0   ezetimibe (ZETIA) 10 MG tablet Take 10 mg by mouth daily.     fenofibrate 160 MG tablet Take 160 mg by mouth daily.     gabapentin (NEURONTIN) 600 MG tablet Take 1,200 mg by mouth 3 (three) times daily.     HYDROcodone-acetaminophen (NORCO/VICODIN) 5-325 MG tablet Take by mouth.     levETIRAcetam (KEPPRA) 750 MG tablet Take 750 mg by mouth 2 (two) times daily.     lidocaine (  LIDODERM) 5 % Place onto the skin.     LINZESS 72 MCG capsule Take 1 capsule (72 mcg total) by mouth daily. 90 capsule 4   magic mouthwash (lidocaine, diphenhydrAMINE, alum & mag hydroxide) suspension Take 5 mLs by mouth every 3 (three) hours as needed. Swish and spit for mouth or throat pain     metoprolol succinate (TOPROL-XL) 25 MG 24 hr tablet Take 25 mg by mouth daily.     midodrine (PROAMATINE) 5 MG tablet Take 5 mg by mouth 3 (three) times daily with meals.     mirabegron ER (MYRBETRIQ) 50 MG TB24 tablet Take 1 tablet by mouth daily.     omeprazole (PRILOSEC OTC) 20 MG tablet Take 1 tablet by mouth daily.     ondansetron (ZOFRAN) 8 MG tablet Take 1 tablet (8 mg total) by mouth 2 (two) times daily as needed. Start on the third day after chemotherapy. 30 tablet 1   pramipexole (MIRAPEX) 1 MG tablet Take 1 mg by mouth at bedtime. Takes 1/2 tab     prochlorperazine (COMPAZINE) 10 MG tablet Take 1 tablet (10 mg total) by mouth every 6 (six) hours as needed (Nausea or vomiting). 30 tablet 1   ramelteon (ROZEREM) 8 MG tablet Take 8 mg by mouth at bedtime.     Vitamin D, Ergocalciferol, (DRISDOL) 1.25 MG (50000 UT) CAPS capsule TAKE 1 CAPSULE BY MOUTH ONCE A WEEK FOR 30 DAYS     vitamin E 1000 UNIT capsule Take 1,000 Units by mouth daily.     zinc gluconate 50 MG tablet Take 50 mg by mouth daily.     No current facility-administered medications for this visit.

## 2021-09-07 ENCOUNTER — Inpatient Hospital Stay: Payer: Medicare Other

## 2021-09-07 VITALS — BP 118/60 | HR 100 | Temp 98.7°F | Resp 18 | Ht 64.0 in | Wt 191.0 lb

## 2021-09-07 DIAGNOSIS — C50112 Malignant neoplasm of central portion of left female breast: Secondary | ICD-10-CM

## 2021-09-07 DIAGNOSIS — Z5111 Encounter for antineoplastic chemotherapy: Secondary | ICD-10-CM | POA: Diagnosis present

## 2021-09-07 DIAGNOSIS — Z170 Estrogen receptor positive status [ER+]: Secondary | ICD-10-CM | POA: Diagnosis not present

## 2021-09-07 DIAGNOSIS — C773 Secondary and unspecified malignant neoplasm of axilla and upper limb lymph nodes: Secondary | ICD-10-CM | POA: Diagnosis not present

## 2021-09-07 DIAGNOSIS — Z79899 Other long term (current) drug therapy: Secondary | ICD-10-CM | POA: Diagnosis not present

## 2021-09-07 MED ORDER — SODIUM CHLORIDE 0.9% FLUSH
10.0000 mL | INTRAVENOUS | Status: DC | PRN
  Administered 2021-09-07: 10 mL

## 2021-09-07 MED ORDER — FAMOTIDINE IN NACL 20-0.9 MG/50ML-% IV SOLN
20.0000 mg | Freq: Once | INTRAVENOUS | Status: AC
  Administered 2021-09-07: 20 mg via INTRAVENOUS
  Filled 2021-09-07: qty 50

## 2021-09-07 MED ORDER — SODIUM CHLORIDE 0.9 % IV SOLN: Freq: Once | INTRAVENOUS | Status: AC

## 2021-09-07 MED ORDER — DIPHENHYDRAMINE HCL 50 MG/ML IJ SOLN
25.0000 mg | Freq: Once | INTRAMUSCULAR | Status: AC
  Administered 2021-09-07: 25 mg via INTRAVENOUS
  Filled 2021-09-07: qty 1

## 2021-09-07 MED ORDER — SODIUM CHLORIDE 0.9 % IV SOLN
10.0000 mg | Freq: Once | INTRAVENOUS | Status: AC
  Administered 2021-09-07: 10 mg via INTRAVENOUS
  Filled 2021-09-07: qty 10

## 2021-09-07 MED ORDER — SODIUM CHLORIDE 0.9 % IV SOLN
64.0000 mg/m2 | Freq: Once | INTRAVENOUS | Status: AC
  Administered 2021-09-07: 126 mg via INTRAVENOUS
  Filled 2021-09-07: qty 21

## 2021-09-07 MED ORDER — HEPARIN SOD (PORK) LOCK FLUSH 100 UNIT/ML IV SOLN
500.0000 [IU] | Freq: Once | INTRAVENOUS | Status: AC | PRN
  Administered 2021-09-07: 500 [IU]

## 2021-09-07 NOTE — Patient Instructions (Signed)
Brandy Maxwell  Discharge Instructions: Thank you for choosing [facility] to provide your oncology and hematology care.  If you have a lab appointment with the [facility], please go directly to the [facility] and check in at the registration area.   Wear comfortable clothing and clothing appropriate for easy access to any Portacath or PICC line.   We strive to give you quality time with your provider. You may need to reschedule your appointment if you arrive late (15 or more minutes).  Arriving late affects you and other patients whose appointments are after yours.  Also, if you miss three or more appointments without notifying the office, you may be dismissed from the clinic at the provider's discretion.      For prescription refill requests, have your pharmacy contact our office and allow 72 hours for refills to be completed.    Today you received the following chemotherapy and/or immunotherapy agents    To help prevent nausea and vomiting after your treatment, we encourage you to take your nausea medication as directed.  BELOW ARE SYMPTOMS THAT SHOULD BE REPORTED IMMEDIATELY: *FEVER GREATER THAN 100.4 F (38 C) OR HIGHER *CHILLS OR SWEATING *NAUSEA AND VOMITING THAT IS NOT CONTROLLED WITH YOUR NAUSEA MEDICATION *UNUSUAL SHORTNESS OF BREATH *UNUSUAL BRUISING OR BLEEDING *URINARY PROBLEMS (pain or burning when urinating, or frequent urination) *BOWEL PROBLEMS (unusual diarrhea, constipation, pain near the anus) TENDERNESS IN MOUTH AND THROAT WITH OR WITHOUT PRESENCE OF ULCERS (sore throat, sores in mouth, or a toothache) UNUSUAL RASH, SWELLING OR PAIN  UNUSUAL VAGINAL DISCHARGE OR ITCHING   Items with * indicate a potential emergency and should be followed up as soon as possible or go to the Emergency Department if any problems should occur.  Please show the CHEMOTHERAPY ALERT CARD or IMMUNOTHERAPY ALERT CARD at check-in to the Emergency  Department and triage nurse.  Should you have questions after your visit or need to cancel or reschedule your appointment, please contact [facility]  Dept: [phone]  and follow the prompts.  Office hours are 8:00 a.m. to 4:30 p.m. Monday - Friday. Please note that voicemails left after 4:00 p.m. may not be returned until the following business day.  We are closed weekends and major holidays. You have access to a nurse at all times for urgent questions. Please call the main number to the clinic Dept: [phone] and follow the prompts.  For any non-urgent questions, you may also contact your provider using MyChart. We now offer e-Visits for anyone 71 and older to request care online for non-urgent symptoms. For details visit [email or web address].   Also download the MyChart app! Go to the app store, search "MyChart", open the app, select [facility], and log in with your MyChart username and password.  Masks are optional in the cancer centers. If you would like for your care team to wear a mask while they are taking care of you, please let them know. You may have one support person who is at least 65 years old accompany you for your appointments. Paclitaxel Injection What is this medication? PACLITAXEL (PAK li TAX el) treats some types of cancer. It works by slowing down the growth of cancer cells. This medicine may be used for other purposes; ask your health care provider or pharmacist if you have questions. COMMON BRAND NAME(S): Onxol, Taxol What should I tell my care team before I take this medication? They need to know if  you have any of these conditions: Heart disease Liver disease Low white blood cell levels An unusual or allergic reaction to paclitaxel, other medications, foods, dyes, or preservatives If you or your partner are pregnant or trying to get pregnant Breast-feeding How should I use this medication? This medication is injected into a vein. It  is given by your care team in a hospital or clinic setting. Talk to your care team about the use of this medication in children. While it may be given to children for selected conditions, precautions do apply. Overdosage: If you think you have taken too much of this medicine contact a poison control center or emergency room at once. NOTE: This medicine is only for you. Do not share this medicine with others. What if I miss a dose? Keep appointments for follow-up doses. It is important not to miss your dose. Call your care team if you are unable to keep an appointment. What may interact with this medication? Do not take this medication with any of the following: Live virus vaccines Other medications may affect the way this medication works. Talk with your care team about all of the medications you take. They may suggest changes to your treatment plan to lower the risk of side effects and to make sure your medications work as intended. This list may not describe all possible interactions. Give your health care provider a list of all the medicines, herbs, non-prescription drugs, or dietary supplements you use. Also tell them if you smoke, drink alcohol, or use illegal drugs. Some items may interact with your medicine. What should I watch for while using this medication? Your condition will be monitored carefully while you are receiving this medication. You may need blood work while taking this medication. This medication may make you feel generally unwell. This is not uncommon as chemotherapy can affect healthy cells as well as cancer cells. Report any side effects. Continue your course of treatment even though you feel ill unless your care team tells you to stop. This medication can cause serious allergic reactions. To reduce the risk, your care team may give you other medications to take before receiving this one. Be sure to follow the directions from your care team. This medication may increase your  risk of getting an infection. Call your care team for advice if you get a fever, chills, sore throat, or other symptoms of a cold or flu. Do not treat yourself. Try to avoid being around people who are sick. This medication may increase your risk to bruise or bleed. Call your care team if you notice any unusual bleeding. Be careful brushing or flossing your teeth or using a toothpick because you may get an infection or bleed more easily. If you have any dental work done, tell your dentist you are receiving this medication. Talk to your care team if you may be pregnant. Serious birth defects can occur if you take this medication during pregnancy. Talk to your care team before breastfeeding. Changes to your treatment plan may be needed. What side effects may I notice from receiving this medication? Side effects that you should report to your care team as soon as possible: Allergic reactions--skin rash, itching, hives, swelling of the face, lips, tongue, or throat Heart rhythm changes--fast or irregular heartbeat, dizziness, feeling faint or lightheaded, chest pain, trouble breathing Increase in blood pressure Infection--fever, chills, cough, sore throat, wounds that don't heal, pain or trouble when passing urine, general feeling of discomfort or being unwell Low blood  pressure--dizziness, feeling faint or lightheaded, blurry vision Low red blood cell level--unusual weakness or fatigue, dizziness, headache, trouble breathing Painful swelling, warmth, or redness of the skin, blisters or sores at the infusion site Pain, tingling, or numbness in the hands or feet Slow heartbeat--dizziness, feeling faint or lightheaded, confusion, trouble breathing, unusual weakness or fatigue Unusual bruising or bleeding Side effects that usually do not require medical attention (report to your care team if they continue or are bothersome): Diarrhea Hair loss Joint pain Loss of appetite Muscle  pain Nausea Vomiting This list may not describe all possible side effects. Call your doctor for medical advice about side effects. You may report side effects to FDA at 1-800-FDA-1088. Where should I keep my medication? This medication is given in a hospital or clinic. It will not be stored at home. NOTE: This sheet is a summary. It may not cover all possible information. If you have questions about this medicine, talk to your doctor, pharmacist, or health care provider.  2023 Elsevier/Gold Standard (2021-05-05 00:00:00)

## 2021-09-09 ENCOUNTER — Ambulatory Visit: Payer: Medicare Other | Admitting: Hematology and Oncology

## 2021-09-09 ENCOUNTER — Other Ambulatory Visit: Payer: Medicare Other

## 2021-09-12 ENCOUNTER — Encounter: Payer: Self-pay | Admitting: Oncology

## 2021-09-13 ENCOUNTER — Other Ambulatory Visit: Payer: Self-pay | Admitting: Oncology

## 2021-09-13 ENCOUNTER — Ambulatory Visit: Payer: Medicare Other

## 2021-09-15 ENCOUNTER — Other Ambulatory Visit: Payer: Self-pay | Admitting: Hematology and Oncology

## 2021-09-19 ENCOUNTER — Other Ambulatory Visit: Payer: Medicare Other

## 2021-09-19 ENCOUNTER — Ambulatory Visit: Payer: Medicare Other | Admitting: Hematology and Oncology

## 2021-09-20 ENCOUNTER — Ambulatory Visit: Payer: Medicare Other

## 2021-09-20 ENCOUNTER — Encounter: Payer: Self-pay | Admitting: Hematology and Oncology

## 2021-09-26 ENCOUNTER — Ambulatory Visit: Payer: Medicare Other | Admitting: Oncology

## 2021-09-26 ENCOUNTER — Inpatient Hospital Stay: Payer: Medicare Other

## 2021-09-27 ENCOUNTER — Ambulatory Visit: Payer: Medicare Other

## 2021-10-09 NOTE — Progress Notes (Addendum)
[facility]  [address] 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 She was treated with left mastectomy.  One node was positive for malignancy.  EndoPredict has revealed her to be at extremely high risk for recurrence, with an EP clin score of 5.4.  Her risk of recurrence in the next 10 years is 55% and she has an estimated chemotherapy benefit of 27%.  She completed 4 cycles of AC chemotherapy in May.  She then received weekly paclitaxel for 8/12 cycles but had interruptions for various complications. During cycle 9, she began to have seizure activity during her pre-meds and she was transported to the hospital via EMS. She was hospitalized, later transferred and treated for meningitis. She then had increased general muscle weakness since receiving paclitaxel.  She still described episodes of shakiness.  CT head was negative. Keppra was increased to 750 mg twice daily, as this was felt to possibly represent seizure activity.  Paclitaxel was held again, but resumed on August 8, then paclitaxel was held again due to worsening fatigue and pain, and then stopped.  She continues to report significant fatigue and generalized weakness, as well as worsening neuropathy.   Osteopenia Her bone density scan from February of 2023 reveals osteopenia of the spine with a T score of -1.6 and the femur is normal.     She is doing well and has now completed her chemotherapy, receiving most of the weekly paclitaxel, 8/12 doses. We now are starting hormonal therapy and I discussed the risks and benefits with her and her family. I will start her on anastrazole  1 mg daily. I also prescribed ampicilling for her dental infection, and nystatin oral suspension. She will return in 2 months with CBC and CMP, and we will plan to flush her port at that time. If all is well, we will go to 3 month follow up after that. The patient understands the plans discussed today and is in agreement with them.  She knows to contact our office if she develops concerns prior to her next appointment.   I provided 30 minutes of face-to-face time during this encounter and > 50% was spent counseling as documented under my assessment and plan.    [name], MD  [facility] AT [facility] [address] [place] [address] Dept: [phone] Dept Fax: [phone]   Orders Placed This Encounter  Procedures   CBC and differential    This external order was created through the Results Console.   CBC    This external order was created through the Results Console.   Basic metabolic panel    This external order was created through the Results Console.   Comprehensive metabolic panel    This external order was created through the Results Console.   Hepatic function panel    This external order was created through the Results Console.      CHIEF COMPLAINT:  CC: Stage IIA hormone receptor positive breast cancer  Current Treatment:  Adjuvant weekly paclitaxel  HISTORY OF PRESENT ILLNESS:   Oncology History  Malignant neoplasm of central portion of left breast  in female, estrogen receptor positive ([place])  09/16/2020 Mammogram   DIGITAL SCREENING BILATERAL MAMMOGRAM: In the left breast, a possible mass warrants further evaluation. In the right breast, no findings suspicious for malignancy.    10/08/2020 Mammogram   DIAGNOSTIC UNILATERAL LEFT MAMMOGRAM AND LEFT BREAST ULTRASOUND: There is architectural distortion at the left breast 12 o'clock.  Targeted ultrasound is performed, showing no focal abnormal discrete  cystic or solid lesion at left breast 12 o'clock the correlate to the mammographic finding. Ultrasound of the left axilla is negative.   10/20/2020 Pathology Results   Breast, left, needle core biopsy, 12 o'clock:             Invasive mammary carcinoma             Mammary carcinoma in situ HER2: Negative (0) ER: Positive 30% PR: Negative 0% Ki67: 1%   10/26/2020 Initial Diagnosis   Malignant neoplasm of central portion of left breast in female, estrogen receptor positive ([place])   12/17/2020 Cancer Staging   Staging form: Breast, AJCC 8th Edition - Clinical stage from 12/17/2020: Stage IIA (cT1b, cN1(sn), cM0, G2, ER+, PR-, HER2-) - Signed by [name], MD on 02/13/2021 Histopathologic type: Infiltrating duct carcinoma, NOS Stage prefix: Initial diagnosis Method of lymph node assessment: Sentinel lymph node biopsy Nuclear grade: G2 Multigene prognostic tests performed: EndoPredict Histologic grading system: 3 grade system Laterality: Left Tumor size (mm): 7 Lymph-vascular invasion (LVI): LVI not present (absent)/not identified Diagnostic confirmation: Positive histology Specimen type: Excision Staged by: Managing physician Menopausal status: Postmenopausal Ki-67 (%): 1 EndoPredict EPclin risk score: 5.4 EndoPredict EPclin risk level: High risk EndoPredict 10-year percentage risk of distant recurrence (%): 55 EndoPredict 10-year risk of distant recurrence: High risk Stage used in treatment planning: Yes National guidelines used in treatment planning: Yes Type of national guideline used in treatment planning: NCCN Staging comments: Endopredict with 27% benefit from chemotherapy   12/17/2020 Pathology Results   Breast, simple mastectomy, left:         Invasive ductal carcinoma, grade 2, 7 mm, and ductal carcinoma in situ         Ribbon clip and biopsy reaction present         All margins negative for invasive carcinoma and DCIS         Fibrocystic changes Lymph  node, sentinel, biopsy, left axillary:         Tumor present in regional lymph node (1/1)   02/18/2021 - 08/30/2021 Chemotherapy   Patient is on Treatment Plan : Breast AC q21 Days / PACLitaxel q7d      Genetic Testing   Negative genetic testing. No pathogenic variants identified on the [facility] Multi-Cancer+RNA panel. The report date is 03/08/2021.  The Multi-Cancer Panel + RNA offered by [facility] includes sequencing and/or deletion duplication testing of the following 84 genes: AIP, ALK, APC, ATM, AXIN2,BAP1,  BARD1, BLM, BMPR1A, BRCA1, BRCA2, BRIP1, CASR, CDC73, CDH1, CDK4, CDKN1B, CDKN1C, CDKN2A (p14ARF), CDKN2A (p16INK4a), CEBPA, CHEK2, CTNNA1, DICER1, DIS3L2, EGFR (c.2369C>T, p.Thr790Met variant only), EPCAM (Deletion/duplication testing only), FH, FLCN, GATA2, GPC3, GREM1 (Promoter region deletion/duplication testing only), HOXB13 (c.251G>A, p.Gly84Glu), HRAS, KIT, MAX, MEN1, MET, MITF (c.952G>A, p.Glu318Lys variant only), MLH1, MSH2, MSH3, MSH6, MUTYH, NBN, NF1, NF2, NTHL1, PALB2, PDGFRA, PHOX2B, PMS2, POLD1, POLE, POT1, PRKAR1A, PTCH1, PTEN, RAD50, RAD51C, RAD51D, RB1, RECQL4, RET, RUNX1, SDHAF2, SDHA (sequence changes only), SDHB, SDHC, SDHD, SMAD4, SMARCA4, SMARCB1, SMARCE1, STK11, SUFU, TERC, TERT, TMEM127, TP53, TSC1, TSC2, VHL, WRN and WT1.   02/18/2021 -  09/07/2021 Chemotherapy   Patient is on Treatment Plan : Breast AC q21 Days / PACLitaxel q7d         INTERVAL HISTORY:  Gwenevere is here today for repeat clinical assessment and has completed her chemotherapy, receiving most of her weekly paclitaxel, 8/12 doses.  She is doing fairly well except for a dental infection, and will need to see a dentist.  I agreed to prescribe ampicillin 500 mg tid x 10 days until she can get to one, and nystatin oral suspension. Her bone density scan was done in February of 2023 and shows osteopenia of the spine, with a T score of -1.6. The femur was normal. CBC today is normal. Her CMP reveals a calcium level of  10.4 and mildly elevated SGOT and SGPT of 44 and 43. Non-fasting glucose was 123. She is now ready to begin hormonal therapy and I discussed the risks and benefits of her options. I will start her on anastrazole 1 mg daily. She still has her port so it will be flushed regularly. She  fevers or chills. She denies pain. Her appetite is good. Her weight has decreased 2 pounds over last week . She states she has a follow up with neurology this month,  REVIEW OF SYSTEMS:  Review of Systems  Constitutional:  Positive for fatigue. Negative for appetite change, chills, fever and unexpected weight change.  HENT:   Negative for lump/mass, mouth sores and sore throat.   Respiratory:  Negative for cough and shortness of breath.   Cardiovascular:  Negative for chest pain and leg swelling.  Gastrointestinal:  Negative for abdominal pain, constipation, diarrhea, nausea and vomiting.  Endocrine: Negative for hot flashes.  Genitourinary:  Negative for difficulty urinating, dysuria, frequency and hematuria.   Musculoskeletal:  Negative for arthralgias, back pain and myalgias.  Skin:  Negative for rash.  Neurological:  Positive for extremity weakness (generalized) and numbness (bilateral feet). Negative for dizziness and headaches.  Hematological:  Negative for adenopathy. Does not bruise/bleed easily.  Psychiatric/Behavioral:  Negative for depression and sleep disturbance. The patient is not nervous/anxious.      VITALS:  Blood pressure 131/61, pulse (!) 104, temperature 97.8 F (36.6 C), temperature source Oral, resp. rate 20, height $RemoveBe'5\' 4"'MkQmVslud$  (1.626 m), weight 187 lb 14.4 oz (85.2 kg), last menstrual period 09/02/1998, SpO2 95 %.  Wt Readings from Last 3 Encounters:  10/10/21 187 lb 14.4 oz (85.2 kg)  09/07/21 191 lb (86.6 kg)  09/06/21 186 lb 6.4 oz (84.6 kg)    Body mass index is 32.25 kg/m.  Performance status (ECOG): 2 - Symptomatic, <50% confined to bed  PHYSICAL EXAM:  Physical Exam Vitals and  nursing note reviewed.  Constitutional:      Appearance: Normal appearance.  HENT:     Head: Normocephalic and atraumatic.     Nose: Nose normal.     Mouth/Throat:     Mouth: Mucous membranes are moist.     Pharynx: Oropharynx is clear.  Eyes:     Extraocular Movements: Extraocular movements intact.     Conjunctiva/sclera: Conjunctivae normal.     Pupils: Pupils are equal, round, and reactive to light.  Cardiovascular:     Rate and Rhythm: Regular rhythm. Tachycardia present.     Heart sounds: Normal heart sounds.  Pulmonary:     Breath sounds: Normal breath sounds.  Chest:  Breasts:    Right: Normal.     Left: Absent.     Comments: Left mastectomy is negative. Abdominal:  General: Bowel sounds are normal.     Palpations: Abdomen is soft.  Musculoskeletal:        General: Normal range of motion.     Cervical back: Normal range of motion.  Skin:    General: Skin is warm and dry.  Neurological:     Mental Status: She is alert and oriented to person, place, and time.  Psychiatric:        Mood and Affect: Mood normal.        Behavior: Behavior normal.        Thought Content: Thought content normal.        Judgment: Judgment normal.   LABS:      Latest Ref Rng & Units 10/10/2021   12:00 AM 09/06/2021   12:00 AM 08/29/2021   12:00 AM  CBC  WBC  8.8     5.9     3.8      Hemoglobin 12.0 - 16.0 13.8     13.4     13.4      Hematocrit 36 - 46 41     39     39      Platelets 150 - 400 K/uL  305     318         This result is from an external source.      Latest Ref Rng & Units 10/10/2021   12:00 AM 09/06/2021   12:00 AM 08/29/2021   12:00 AM  CMP  BUN 4 - _0 Creatinine 0.5 - 1.1 0.8     0.8     0.6      Sodium 137 - 147 139     138     137      Potassium 3.5 - 5.1 mEq/L 4.1     3.9     4.2      Chloride 99 - 108 105     103     105      CO2 13 - _1 Calcium 8.7 - 10.7 10.4     10.1     10.0      Alkaline Phos 25 - 125 57      52     54      AST 13 - 35 44     40     42      ALT 7 - 35 U/L 43     42     38         This result is from an external source.     No results found for: "CEA1", "CEA" / No results found for: "CEA1", "CEA" No results found for: "PSA1" No results found for: "CWC376" No results found for: "CAN125"  No results found for: "TOTALPROTELP", "ALBUMINELP", "A1GS", "A2GS", "BETS", "BETA2SER", "GAMS", "MSPIKE", "SPEI" No results found for: "TIBC", "FERRITIN", "IRONPCTSAT" No results found for: "LDH"  STUDIES:  No results found.    HISTORY:   Past Medical History:  Diagnosis Date   Abnormal mammogram of left breast 11/05/2018   Allergic rhinitis 08/30/2013   Altered consciousness 09/29/2020   Arthritis    Atypical ductal hyperplasia of right breast 05/21/2017   Cardiac murmur 09/23/2019   Chronic kidney disease    recurrent kidney infections   Coccydynia 03/24/2015   Degeneration  of thoracic or thoracolumbar intervertebral disc 02/12/2013   Depression with anxiety 08/30/2013   Diet-controlled diabetes mellitus ([place]) 10/31/2019   Difficulty in walking 08/30/2013   Disorder of sacrum 02/12/2013   Dyspnea on exertion 10/29/2020   Epilepsy ([place]) 08/30/2013   Excessive daytime sleepiness 07/03/2017   Family history of breast cancer    Family history of colon cancer    Family history of first degree relative with dementia 10/26/2017   Family history of lung cancer    Family history of prostate cancer    Family history of uterine cancer    Fibromyalgia    Gait abnormality 07/10/2012   Genetic testing 03/09/2021   Negative genetic testing. No pathogenic variants identified on the [facility] Multi-Cancer+RNA panel. The report date is 03/08/2021.  The Multi-Cancer Panel + RNA offered by [facility] includes sequencing and/or deletion duplication testing of the following 84 genes: AIP, ALK, APC, ATM, AXIN2,BAP1,  BARD1, BLM, BMPR1A, BRCA1, BRCA2, BRIP1, CASR, CDC73, CDH1, CDK4, CDKN1B, CDKN1C,  CDKN2A (p14ARF), CDKN2A (   Herpes zoster with nervous system complications 97/02/6376   History of seizure disorder 03/17/2019   Hypercholesteremia    Hyperlipidemia 08/30/2013   IBS (irritable bowel syndrome)    Insomnia 09/07/2015   Malignant neoplasm of central portion of left breast in female, estrogen receptor positive ([place]) 10/26/2020   Mass of upper outer quadrant of left breast 05/21/2017   Medication management 01/31/2019   Migraine headache 08/30/2013   Mild cognitive impairment 03/17/2019   Muscle weakness (generalized) 07/25/2021   Myalgia and myositis 02/12/2013   Neurogenic bladder 05/21/2020   Neuropathic pain 07/22/2020   Neuropathy 03/17/2019   Obesity, Class I, BMI 30-34.9 03/17/2019   Obstructive sleep apnea (adult) (pediatric) 08/30/2013   Pain in thoracic spine 02/12/2013   Palpitations 09/23/2019   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   Postlaminectomy syndrome, lumbar region 02/12/2013   Postoperative examination 06/19/2017   Postural hypotension 09/23/2019   Recurrent urinary tract infection 08/29/2021   Restless legs syndrome 05/22/2017   Risk for falls 09/07/2015   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   Seizures ([place])    Sleep apnea    nightly cpap   Spondylosis of thoracic region without myelopathy or radiculopathy 02/12/2013   Thoracic or lumbosacral neuritis or radiculitis 02/12/2013   Thoracic spondylosis with myelopathy 02/12/2013   Tremors of nervous system    Urinary incontinence 03/17/2019   Urinary incontinence, mixed 05/29/2013   Vitamin B 12 deficiency 03/17/2019   Vitamin D deficiency 03/17/2019   Weakness 07/10/2012    Past Surgical History:  Procedure Laterality Date   BLADDER REPAIR  03/13/2018   BLADDER SUSPENSION  03/13/2018   BREAST LUMPECTOMY  2019   COLONOSCOPY  12/20/2012   Left colonic diverticulosis predominantly in the sigmoid colon. Small internal hemmorhoids. Otherwise normal  colonoscopy.    LAMINECTOMY  1998    Family History  Problem Relation Age of Onset   Hypercholesterolemia Mother    Alzheimer's disease Mother    Heart Problems Father    Clotting disorder Father    Alzheimer's disease Father    Prostate cancer Father        dx 50s   Allergic rhinitis Sister    Uterine cancer Maternal Aunt        dx 75s, d. 59   Breast cancer Maternal Aunt 60       recurrence, metastatic, d. 43   Hemophilia Maternal Aunt    Breast cancer Maternal Aunt  dx 4-s d. 84s   Lung cancer Maternal Uncle 52       smoker   Lung cancer Maternal Uncle        60s   Breast cancer Paternal Aunt 25       dx 84s d. 17, 3 recurrences   Liver cancer Paternal Uncle    Kidney cancer Paternal Uncle    Colon cancer Maternal Grandfather        21s   Brain cancer Paternal Grandfather        43s   Colon cancer Cousin    Breast cancer Cousin        dx 20s, bilateral mastectomy   Esophageal cancer Neg Hx    Stomach cancer Neg Hx    Rectal cancer Neg Hx     Social History:  reports that she has never smoked. She has never used smokeless tobacco. She reports that she does not drink alcohol and does not use drugs.The patient is accompanied by her daughter today.  Allergies:  Allergies  Allergen Reactions   Nitrofurantoin Anaphylaxis   Sulfa Antibiotics Rash, Other (See Comments) and Hives    Other reaction(s): Rash     Current Medications: Current Outpatient Medications  Medication Sig Dispense Refill   anastrozole (ARIMIDEX) 1 MG tablet Take 1 tablet (1 mg total) by mouth daily. 30 tablet 5   dexamethasone (DECADRON) 4 MG tablet Take 3 tabs at the night before and 3 tab the morning of chemotherapy 60 tablet 0   dicyclomine (BENTYL) 10 MG capsule Take 10 mg by mouth 3 (three) times daily as needed for pain. With IBS     DULoxetine (CYMBALTA) 60 MG capsule Take 1 capsule by mouth at bedtime.     EPINEPHrine 0.3 mg/0.3 mL IJ SOAJ injection Inject 0.3 mg into the muscle  as needed for anaphylaxis.     estradiol (ESTRACE) 0.1 MG/GM vaginal cream PLACE 1 APPLICATORFUL VAGINALLY AT BEDTIME. 42.5 g 0   ezetimibe (ZETIA) 10 MG tablet Take 10 mg by mouth daily.     fenofibrate 160 MG tablet Take 160 mg by mouth daily.     gabapentin (NEURONTIN) 600 MG tablet Take 1,200 mg by mouth 3 (three) times daily.     levETIRAcetam (KEPPRA) 750 MG tablet Take 750 mg by mouth 2 (two) times daily.     lidocaine (LIDODERM) 5 % Place onto the skin.     LINZESS 72 MCG capsule Take 1 capsule (72 mcg total) by mouth daily. 90 capsule 4   magic mouthwash (lidocaine, diphenhydrAMINE, alum & mag hydroxide) suspension Take 5 mLs by mouth every 3 (three) hours as needed. Swish and spit for mouth or throat pain     metoprolol succinate (TOPROL-XL) 25 MG 24 hr tablet Take 25 mg by mouth daily.     midodrine (PROAMATINE) 5 MG tablet Take 5 mg by mouth 3 (three) times daily with meals.     mirabegron ER (MYRBETRIQ) 50 MG TB24 tablet Take 1 tablet by mouth daily.     nystatin (MYCOSTATIN) 100000 UNIT/ML suspension Take 5 mLs (500,000 Units total) by mouth 4 (four) times daily. 60 mL 1   omeprazole (PRILOSEC OTC) 20 MG tablet Take 1 tablet by mouth daily.     ondansetron (ZOFRAN) 8 MG tablet Take 1 tablet (8 mg total) by mouth 2 (two) times daily as needed. Start on the third day after chemotherapy. 30 tablet 1   pramipexole (MIRAPEX) 1 MG tablet Take 1 mg by mouth  at bedtime. Takes 1/2 tab     prochlorperazine (COMPAZINE) 10 MG tablet Take 1 tablet (10 mg total) by mouth every 6 (six) hours as needed (Nausea or vomiting). 30 tablet 1   ramelteon (ROZEREM) 8 MG tablet Take 8 mg by mouth at bedtime.     Vitamin D, Ergocalciferol, (DRISDOL) 1.25 MG (50000 UT) CAPS capsule TAKE 1 CAPSULE BY MOUTH ONCE A WEEK FOR 30 DAYS     vitamin E 1000 UNIT capsule Take 1,000 Units by mouth daily.     zinc gluconate 50 MG tablet Take 50 mg by mouth daily.     No current facility-administered medications for this  visit.

## 2021-10-10 ENCOUNTER — Other Ambulatory Visit: Payer: Self-pay | Admitting: Oncology

## 2021-10-10 ENCOUNTER — Inpatient Hospital Stay: Payer: Medicare Other | Attending: Oncology

## 2021-10-10 ENCOUNTER — Encounter: Payer: Self-pay | Admitting: Oncology

## 2021-10-10 ENCOUNTER — Inpatient Hospital Stay (INDEPENDENT_AMBULATORY_CARE_PROVIDER_SITE_OTHER): Payer: Medicare Other | Admitting: Oncology

## 2021-10-10 VITALS — BP 131/61 | HR 104 | Temp 97.8°F | Resp 20 | Ht 64.0 in | Wt 187.9 lb

## 2021-10-10 DIAGNOSIS — C50112 Malignant neoplasm of central portion of left female breast: Secondary | ICD-10-CM

## 2021-10-10 DIAGNOSIS — C773 Secondary and unspecified malignant neoplasm of axilla and upper limb lymph nodes: Secondary | ICD-10-CM | POA: Diagnosis not present

## 2021-10-10 DIAGNOSIS — M8580 Other specified disorders of bone density and structure, unspecified site: Secondary | ICD-10-CM | POA: Diagnosis not present

## 2021-10-10 DIAGNOSIS — Z170 Estrogen receptor positive status [ER+]: Secondary | ICD-10-CM

## 2021-10-10 DIAGNOSIS — Z780 Asymptomatic menopausal state: Secondary | ICD-10-CM

## 2021-10-10 DIAGNOSIS — K047 Periapical abscess without sinus: Secondary | ICD-10-CM

## 2021-10-10 DIAGNOSIS — B370 Candidal stomatitis: Secondary | ICD-10-CM

## 2021-10-10 LAB — HEPATIC FUNCTION PANEL
ALT: 43 U/L — AB (ref 7–35)
AST: 44 — AB (ref 13–35)
Alkaline Phosphatase: 57 (ref 25–125)
Bilirubin, Total: 0.8

## 2021-10-10 LAB — CBC AND DIFFERENTIAL
HCT: 41 (ref 36–46)
Hemoglobin: 13.8 (ref 12.0–16.0)
Neutrophils Absolute: 5.63
WBC: 8.8

## 2021-10-10 LAB — COMPREHENSIVE METABOLIC PANEL
Albumin: 4.6 (ref 3.5–5.0)
Calcium: 10.4 (ref 8.7–10.7)

## 2021-10-10 LAB — BASIC METABOLIC PANEL
BUN: 18 (ref 4–21)
CO2: 23 — AB (ref 13–22)
Chloride: 105 (ref 99–108)
Creatinine: 0.8 (ref 0.5–1.1)
Glucose: 123
Potassium: 4.1 mEq/L (ref 3.5–5.1)
Sodium: 139 (ref 137–147)

## 2021-10-10 LAB — CBC: RBC: 4.31 (ref 3.87–5.11)

## 2021-10-10 MED ORDER — AMPICILLIN 500 MG PO CAPS: 500.0000 mg | ORAL_CAPSULE | Freq: Four times a day (QID) | ORAL | 0 refills | Status: AC

## 2021-10-10 MED ORDER — NYSTATIN 100000 UNIT/ML MT SUSP: 5.0000 mL | Freq: Four times a day (QID) | OROMUCOSAL | 1 refills | Status: DC

## 2021-10-10 MED ORDER — ANASTROZOLE 1 MG PO TABS: 1.0000 mg | ORAL_TABLET | Freq: Every day | ORAL | 5 refills | Status: DC

## 2021-10-30 ENCOUNTER — Encounter: Payer: Self-pay | Admitting: Hematology and Oncology

## 2021-12-01 ENCOUNTER — Other Ambulatory Visit: Payer: Self-pay | Admitting: Oncology

## 2021-12-06 NOTE — Progress Notes (Signed)
Brandy Maxwell  [address] 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 She was treated with left mastectomy.  One node was positive for malignancy.  EndoPredict has revealed her to be at extremely high risk for recurrence, with an EP clin score of 5.4.  Her risk of recurrence in the next 10 years is 55% and she has an estimated chemotherapy benefit of 27%.  She completed 4 cycles of AC chemotherapy in May.  She then received weekly paclitaxel for 8/12 cycles but had interruptions for various complications. During cycle 9, she began to have seizure activity during her pre-meds and she was transported to the hospital via EMS. She was hospitalized, later transferred and treated for meningitis. She then had increased general muscle weakness since receiving paclitaxel.  She still described episodes of shakiness.  CT head was negative. Keppra was increased to 750 mg twice daily, as this was felt to possibly represent seizure activity.  Paclitaxel was held again, but resumed on August 8, then paclitaxel was held again due to worsening fatigue and pain, and then stopped.    Osteopenia Her bone density scan from February of 2023 reveals osteopenia of the spine with a T score of -1.6 and the femur is normal.     Plan: She is doing well and has completed her chemotherapy in October 2023, receiving most of the weekly paclitaxel, 8/12 doses. We have started her on anastrozole 1 mg daily. She asked about reconstruction of the left mastectomy and I answered her questions. She will let me know if she wants a referral to the plastic surgeon. I have given her  information about [employer] about mastectomy bras and prosthesis.She will return in 3 months with CBC and CMP, and we will plan to flush her port at that time unless she is ready to get it removed. The patient understands the plans discussed today and is in agreement with them.  She knows to contact our office if she develops concerns prior to her next appointment.   I provided 30 minutes of face-to-face time during this encounter and > 50% was spent counseling as documented under my assessment and plan.    Brandy Kaplan, MD  [facility] [address] [name] [address] Dept: [phone] Dept Fax: [phone]   No orders of the defined types were placed in this encounter.     CHIEF COMPLAINT:  CC: Stage IIA hormone receptor positive breast cancer  Current Treatment:  Adjuvant weekly paclitaxel  HISTORY OF PRESENT ILLNESS:   Oncology History  Malignant neoplasm of central portion of left breast in female, estrogen receptor positive ([place])  09/16/2020 Mammogram   DIGITAL SCREENING BILATERAL MAMMOGRAM: In the left breast, a possible mass warrants further evaluation. In the right breast, no findings suspicious for malignancy.    10/08/2020 Mammogram   DIAGNOSTIC UNILATERAL LEFT MAMMOGRAM AND LEFT BREAST ULTRASOUND: There is architectural distortion at the left breast 12 o'clock.  Targeted ultrasound is performed, showing no focal abnormal discrete cystic or solid lesion at left breast 12 o'clock the correlate to the mammographic finding. Ultrasound of  the left axilla is negative.   10/20/2020 Pathology Results   Breast, left, needle core biopsy, 12 o'clock:             Invasive mammary carcinoma             Mammary carcinoma in situ HER2: Negative (0) ER: Positive 30% PR: Negative 0% Ki67: 1%   10/26/2020 Initial Diagnosis   Malignant neoplasm of central portion of left breast in female, estrogen  receptor positive ([place])   12/17/2020 Cancer Staging   Staging form: Breast, AJCC 8th Edition - Clinical stage from 12/17/2020: Stage IIA (cT1b, cN1(sn), cM0, G2, ER+, PR-, HER2-) - Signed by Brandy Kaplan, MD on 02/13/2021 Histopathologic type: Infiltrating duct carcinoma, NOS Stage prefix: Initial diagnosis Method of lymph node assessment: Sentinel lymph node biopsy Nuclear grade: G2 Multigene prognostic tests performed: EndoPredict Histologic grading system: 3 grade system Laterality: Left Tumor size (mm): 7 Lymph-vascular invasion (LVI): LVI not present (absent)/not identified Diagnostic confirmation: Positive histology Specimen type: Excision Staged by: Managing physician Menopausal status: Postmenopausal Ki-67 (%): 1 EndoPredict EPclin risk score: 5.4 EndoPredict EPclin risk level: High risk EndoPredict 10-year percentage risk of distant recurrence (%): 55 EndoPredict 10-year risk of distant recurrence: High risk Stage used in treatment planning: Yes National guidelines used in treatment planning: Yes Type of national guideline used in treatment planning: NCCN Staging comments: Endopredict with 27% benefit from chemotherapy   12/17/2020 Pathology Results   Breast, simple mastectomy, left:         Invasive ductal carcinoma, grade 2, 7 mm, and ductal carcinoma in situ         Ribbon clip and biopsy reaction present         All margins negative for invasive carcinoma and DCIS         Fibrocystic changes Lymph node, sentinel, biopsy, left axillary:         Tumor present in regional lymph node (1/1)   02/18/2021 - 08/30/2021 Chemotherapy   Patient is on Treatment Plan : Breast AC q21 Days / PACLitaxel q7d      Genetic Testing   Negative genetic testing. No pathogenic variants identified on the [facility] Multi-Cancer+RNA panel. The report date is 03/08/2021.  The Multi-Cancer Panel + RNA offered by [facility] includes sequencing and/or deletion duplication testing of the  following 84 genes: AIP, ALK, APC, ATM, AXIN2,BAP1,  BARD1, BLM, BMPR1A, BRCA1, BRCA2, BRIP1, CASR, CDC73, CDH1, CDK4, CDKN1B, CDKN1C, CDKN2A (p14ARF), CDKN2A (p16INK4a), CEBPA, CHEK2, CTNNA1, DICER1, DIS3L2, EGFR (c.2369C>T, p.Thr790Met variant only), EPCAM (Deletion/duplication testing only), FH, FLCN, GATA2, GPC3, GREM1 (Promoter region deletion/duplication testing only), HOXB13 (c.251G>A, p.Gly84Glu), HRAS, KIT, MAX, MEN1, MET, MITF (c.952G>A, p.Glu318Lys variant only), MLH1, MSH2, MSH3, MSH6, MUTYH, NBN, NF1, NF2, NTHL1, PALB2, PDGFRA, PHOX2B, PMS2, POLD1, POLE, POT1, PRKAR1A, PTCH1, PTEN, RAD50, RAD51C, RAD51D, RB1, RECQL4, RET, RUNX1, SDHAF2, SDHA (sequence changes only), SDHB, SDHC, SDHD, SMAD4, SMARCA4, SMARCB1, SMARCE1, STK11, SUFU, TERC, TERT, TMEM127, TP53, TSC1, TSC2, VHL, WRN and WT1.   02/18/2021 - 09/07/2021 Chemotherapy   Patient is on Treatment Plan : Breast AC q21 Days / PACLitaxel q7d         INTERVAL HISTORY:  Brandy Maxwell is here today for repeat clinical assessment. She states she has good and bad days but today is feeling fine. She continues taking anastrozole 1 mg daily.I have let her know when she is ready that I am okay with her getting her port removed. She will have her port flushed after this visit. She asked about  reconstruction of the left mastectomy and I answered her questions. She will let me know if she wants a referral to the plastic surgeon. I have given her information about [facility] about mastectomy bras and prosthesis. She reports constipation which may be from all the medications she is taking. She  fevers or chills. She denies pain. Her appetite is good. Her weight has decreased 2 pounds over last week . She states she has a follow up with neurology this month,  REVIEW OF SYSTEMS:  Review of Systems  Constitutional:  Positive for fatigue. Negative for appetite change, chills, fever and unexpected weight change.  HENT:   Negative for lump/mass, mouth sores and  sore throat.   Respiratory:  Negative for cough and shortness of breath.   Cardiovascular:  Negative for chest pain and leg swelling.  Gastrointestinal:  Negative for abdominal pain, diarrhea, nausea and vomiting.  Endocrine: Negative for hot flashes.  Genitourinary:  Negative for difficulty urinating, dysuria, frequency and hematuria.   Musculoskeletal:  Negative for arthralgias, back pain and myalgias.  Skin:  Negative for rash.  Neurological:  Positive for extremity weakness (generalized) and numbness (bilateral feet). Negative for dizziness and headaches.  Hematological:  Negative for adenopathy. Does not bruise/bleed easily.  Psychiatric/Behavioral:  Negative for depression and sleep disturbance. The patient is not nervous/anxious.      VITALS:  Blood pressure 134/66, pulse (!) 119, temperature 99.1 F (37.3 C), temperature source Oral, resp. rate 18, height _0  (1.626 m), weight 188 lb 4.8 oz (85.4 kg), last menstrual period 09/02/1998, SpO2 94 %.  Wt Readings from Last 3 Encounters:  12/08/21 188 lb 4.8 oz (85.4 kg)  10/10/21 187 lb 14.4 oz (85.2 kg)  09/07/21 191 lb (86.6 kg)    Body mass index is 32.32 kg/m.  Performance status (ECOG): 2 - Symptomatic, <50% confined to bed  PHYSICAL EXAM:  Physical Exam Vitals and nursing note reviewed.  Constitutional:      General: She is not in acute distress.    Appearance: Normal appearance. She is not ill-appearing or toxic-appearing.  HENT:     Head: Normocephalic and atraumatic.     Nose: Nose normal.     Mouth/Throat:     Mouth: Mucous membranes are moist.     Pharynx: Oropharynx is clear.  Eyes:     General: No scleral icterus.    Extraocular Movements: Extraocular movements intact.     Conjunctiva/sclera: Conjunctivae normal.     Pupils: Pupils are equal, round, and reactive to light.  Cardiovascular:     Rate and Rhythm: Regular rhythm. Tachycardia present.     Pulses: Normal pulses.     Heart sounds: Normal heart  sounds.  Pulmonary:     Effort: Pulmonary effort is normal. No respiratory distress.     Breath sounds: Normal breath sounds. No wheezing or rales.  Chest:  Breasts:    Right: Normal.     Left: Absent.     Comments: Left mastectomy is negative. Well healed scar of the right breast at about 8 o'clock. Mild fibrocystic changes.  Mild erythema of the central upper chest wall No nodules Firmness of the left axilla Abdominal:     General: Bowel sounds are normal.     Palpations: Abdomen is soft.  Musculoskeletal:        General: Normal range of motion.     Cervical back: Normal range of motion.  Skin:    General: Skin is warm and dry.  Neurological:     Mental Status: She is alert and oriented to person, place, and time.  Psychiatric:        Mood and Affect: Mood normal.        Behavior: Behavior normal.        Thought Content: Thought content normal.        Judgment: Judgment normal.    LABS:      Latest Ref Rng & Units 12/08/2021    2:13 PM 10/10/2021   12:00 AM 09/06/2021   12:00 AM  CBC  WBC 4.0 - 10.5 K/uL 6.1  8.8     5.9      Hemoglobin 12.0 - 15.0 g/dL 14.7  13.8     13.4      Hematocrit 36.0 - 46.0 % 44.3  41     39      Platelets 150 - 400 K/uL 312   305         This result is from an external source.      Latest Ref Rng & Units 12/08/2021    2:13 PM 10/10/2021   12:00 AM 09/06/2021   12:00 AM  CMP  Glucose 70 - 99 mg/dL 172     BUN 8 - 23 mg/dL _0 Creatinine 0.44 - 1.00 mg/dL 0.97  0.8     0.8      Sodium 135 - 145 mmol/L 140  139     138      Potassium 3.5 - 5.1 mmol/L 4.2  4.1     3.9      Chloride 98 - 111 mmol/L 107  105     103      CO2 22 - 32 mmol/L _1 Calcium 8.9 - 10.3 mg/dL 10.8  10.4     10.1      Total Protein 6.5 - 8.1 g/dL 7.5     Total Bilirubin 0.3 - 1.2 mg/dL 0.3     Alkaline Phos 38 - 126 U/L 48  57     52      AST 15 - 41 U/L 29  44     40      ALT 0 - 44 U/L 30  43     42         This result is from an  external source.     No results found for: "CEA1", "CEA" / No results found for: "CEA1", "CEA" No results found for: "PSA1" No results found for: "ELF810" No results found for: "CAN125"  No results found for: "TOTALPROTELP", "ALBUMINELP", "A1GS", "A2GS", "BETS", "BETA2SER", "GAMS", "MSPIKE", "SPEI" No results found for: "TIBC", "FERRITIN", "IRONPCTSAT" No results found for: "LDH"  STUDIES:  No results found.  EXAM:11/07/21 DEXA ASSESSMENT The BMD measured at AP Spine L1-L3 is 0.990 g/cm2 with a T-score of -1.6 is considered moderately low. Fracture risk is moderate. Treatment is advised if there are other risk factors.  With a Z--score of -0.8, this patient's BMD is considered within normal limits relative to their age. Even so, they may be considered osteopenic or osteoporotic which is normal for this age.  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 02/12/2013   Depression with anxiety 08/30/2013   Diet-controlled diabetes mellitus ([place]) 10/31/2019   Difficulty in walking 08/30/2013   Disorder of sacrum 02/12/2013   Dyspnea on exertion 10/29/2020   Epilepsy ([place]) 08/30/2013   Excessive daytime sleepiness 07/03/2017   Family history of breast cancer    Family history of colon cancer    Family history of first degree relative with dementia 10/26/2017   Family history of lung cancer    Family history of prostate cancer    Family history of uterine cancer    Fibromyalgia    Gait abnormality 07/10/2012   Genetic testing 03/09/2021   Negative genetic testing. No pathogenic variants identified on the [facility]  Multi-Cancer+RNA panel. The report date is 03/08/2021.  The Multi-Cancer Panel + RNA offered by [facility] includes sequencing and/or deletion duplication testing of the following 84 genes: AIP, ALK, APC, ATM, AXIN2,BAP1,  BARD1, BLM, BMPR1A, BRCA1, BRCA2, BRIP1, CASR, CDC73, CDH1, CDK4, CDKN1B, CDKN1C, CDKN2A (p14ARF), CDKN2A (   Herpes zoster with nervous system complications 01/75/1025   History of seizure disorder 03/17/2019   Hypercholesteremia    Hyperlipidemia 08/30/2013   IBS (irritable bowel syndrome)    Insomnia 09/07/2015   Malignant neoplasm of central portion of left breast in female, estrogen receptor positive ([place]) 10/26/2020   Mass of upper outer quadrant of left breast 05/21/2017   Medication management 01/31/2019   Migraine headache 08/30/2013   Mild cognitive impairment 03/17/2019   Muscle weakness (generalized) 07/25/2021   Myalgia and myositis 02/12/2013   Neurogenic bladder 05/21/2020   Neuropathic pain 07/22/2020   Neuropathy 03/17/2019   Obesity, Class I, BMI 30-34.9 03/17/2019   Obstructive sleep apnea (adult) (pediatric) 08/30/2013   Pain in thoracic spine 02/12/2013   Palpitations 09/23/2019   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   Postlaminectomy syndrome, lumbar region 02/12/2013   Postoperative examination 06/19/2017   Postural hypotension 09/23/2019   Recurrent urinary tract infection 08/29/2021   Restless legs syndrome 05/22/2017   Risk for falls 09/07/2015   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   Seizures ([place])    Sleep apnea    nightly cpap   Spondylosis of thoracic region without myelopathy or radiculopathy 02/12/2013   Thoracic or lumbosacral neuritis or radiculitis 02/12/2013   Thoracic spondylosis with myelopathy 02/12/2013   Tremors of nervous system    Urinary incontinence 03/17/2019   Urinary incontinence, mixed 05/29/2013   Vitamin B 12 deficiency 03/17/2019   Vitamin D deficiency 03/17/2019    Weakness 07/10/2012    Past Surgical History:  Procedure Laterality Date   BLADDER REPAIR  03/13/2018   BLADDER SUSPENSION  03/13/2018   BREAST LUMPECTOMY  2019   COLONOSCOPY  12/20/2012   Left colonic diverticulosis predominantly in the sigmoid colon. Small internal hemmorhoids. Otherwise normal colonoscopy.    LAMINECTOMY  1998    Family History  Problem Relation Age of Onset   Hypercholesterolemia Mother    Alzheimer's disease Mother    Heart Problems Father    Clotting disorder Father    Alzheimer's disease Father    Prostate cancer Father        dx 51s   Allergic rhinitis Sister    Uterine cancer Maternal Aunt        dx 93s, d. 64   Breast cancer Maternal Aunt  60       recurrence, metastatic, d. 83   Hemophilia Maternal Aunt    Breast cancer Maternal Aunt        dx 4-s d. 72s   Lung cancer Maternal Uncle 50       smoker   Lung cancer Maternal Uncle        60s   Breast cancer Paternal Aunt 106       dx 17s d. 26, 3 recurrences   Liver cancer Paternal Uncle    Kidney cancer Paternal Uncle    Colon cancer Maternal Grandfather        35s   Brain cancer Paternal Grandfather        91s   Colon cancer Cousin    Breast cancer Cousin        dx 2s, bilateral mastectomy   Esophageal cancer Neg Hx    Stomach cancer Neg Hx    Rectal cancer Neg Hx     Social History:  reports that she has never smoked. She has never used smokeless tobacco. She reports that she does not drink alcohol and does not use drugs.The patient is accompanied by her daughter today.  Allergies:  Allergies  Allergen Reactions   Nitrofurantoin Anaphylaxis   Sulfa Antibiotics Rash, Other (See Comments) and Hives    Other reaction(s): Rash     Current Medications: Current Outpatient Medications  Medication Sig Dispense Refill   chlorhexidine (PERIDEX) 0.12 % solution as directed.     anastrozole (ARIMIDEX) 1 MG tablet Take 1 tablet (1 mg total) by mouth daily. 30 tablet 5   cyclobenzaprine  (FLEXERIL) 10 MG tablet Take 10 mg by mouth 3 (three) times daily as needed.     dexamethasone (DECADRON) 4 MG tablet Take 3 tabs at the night before and 3 tab the morning of chemotherapy 60 tablet 0   dicyclomine (BENTYL) 10 MG capsule Take 10 mg by mouth 3 (three) times daily as needed for pain. With IBS     DULoxetine (CYMBALTA) 60 MG capsule Take 1 capsule by mouth at bedtime.     EPINEPHrine 0.3 mg/0.3 mL IJ SOAJ injection Inject 0.3 mg into the muscle as needed for anaphylaxis.     estradiol (ESTRACE) 0.1 MG/GM vaginal cream PLACE 1 APPLICATORFUL VAGINALLY AT BEDTIME. 42.5 g 0   ezetimibe (ZETIA) 10 MG tablet Take 10 mg by mouth daily.     fenofibrate 160 MG tablet Take 160 mg by mouth daily.     gabapentin (NEURONTIN) 600 MG tablet Take 1,200 mg by mouth 3 (three) times daily.     levETIRAcetam (KEPPRA) 750 MG tablet Take 750 mg by mouth 2 (two) times daily.     lidocaine (LIDODERM) 5 % Place onto the skin.     LINZESS 72 MCG capsule Take 1 capsule (72 mcg total) by mouth daily. 90 capsule 4   magic mouthwash (lidocaine, diphenhydrAMINE, alum & mag hydroxide) suspension Take 5 mLs by mouth every 3 (three) hours as needed. Swish and spit for mouth or throat pain     midodrine (PROAMATINE) 5 MG tablet Take 5 mg by mouth 3 (three) times daily with meals.     mirabegron ER (MYRBETRIQ) 50 MG TB24 tablet Take 1 tablet by mouth daily.     nystatin (MYCOSTATIN) 100000 UNIT/ML suspension Take 5 mLs (500,000 Units total) by mouth 4 (four) times daily. 60 mL 1   omeprazole (PRILOSEC OTC) 20 MG tablet Take 1 tablet by mouth daily.  ondansetron (ZOFRAN) 8 MG tablet Take 1 tablet (8 mg total) by mouth 2 (two) times daily as needed. Start on the third day after chemotherapy. 30 tablet 1   pramipexole (MIRAPEX) 1 MG tablet Take 1 mg by mouth at bedtime. Takes 1/2 tab     prochlorperazine (COMPAZINE) 10 MG tablet Take 1 tablet (10 mg total) by mouth every 6 (six) hours as needed (Nausea or vomiting). 30  tablet 1   ramelteon (ROZEREM) 8 MG tablet Take 8 mg by mouth at bedtime.     Vitamin D, Ergocalciferol, (DRISDOL) 1.25 MG (50000 UT) CAPS capsule TAKE 1 CAPSULE BY MOUTH ONCE A WEEK FOR 30 DAYS     vitamin E 1000 UNIT capsule Take 1,000 Units by mouth daily.     zinc gluconate 50 MG tablet Take 50 mg by mouth daily.     No current facility-administered medications for this visit.         I,Gabriella Ballesteros,acting as a scribe for Brandy Kaplan, MD.,have documented all relevant documentation on the behalf of Brandy Kaplan, MD,as directed by  Brandy Kaplan, MD while in the presence of Brandy Kaplan, MD.]

## 2021-12-08 ENCOUNTER — Inpatient Hospital Stay: Payer: Medicare Other | Attending: Oncology

## 2021-12-08 ENCOUNTER — Inpatient Hospital Stay (INDEPENDENT_AMBULATORY_CARE_PROVIDER_SITE_OTHER): Payer: Medicare Other | Admitting: Oncology

## 2021-12-08 ENCOUNTER — Telehealth: Payer: Self-pay | Admitting: *Deleted

## 2021-12-08 ENCOUNTER — Other Ambulatory Visit: Payer: Self-pay | Admitting: Oncology

## 2021-12-08 VITALS — BP 134/66 | HR 119 | Temp 99.1°F | Resp 18 | Ht 64.0 in | Wt 188.3 lb

## 2021-12-08 DIAGNOSIS — C50112 Malignant neoplasm of central portion of left female breast: Secondary | ICD-10-CM | POA: Diagnosis present

## 2021-12-08 DIAGNOSIS — Z170 Estrogen receptor positive status [ER+]: Secondary | ICD-10-CM | POA: Insufficient documentation

## 2021-12-08 LAB — CBC WITH DIFFERENTIAL (CANCER CENTER ONLY)
Abs Immature Granulocytes: 0.06 10*3/uL (ref 0.00–0.07)
Basophils Absolute: 0 10*3/uL (ref 0.0–0.1)
Basophils Relative: 0 %
Eosinophils Absolute: 0.1 10*3/uL (ref 0.0–0.5)
Eosinophils Relative: 2 %
HCT: 44.3 % (ref 36.0–46.0)
Hemoglobin: 14.7 g/dL (ref 12.0–15.0)
Immature Granulocytes: 1 %
Lymphocytes Relative: 37 %
Lymphs Abs: 2.3 10*3/uL (ref 0.7–4.0)
MCH: 31.6 pg (ref 26.0–34.0)
MCHC: 33.2 g/dL (ref 30.0–36.0)
MCV: 95.3 fL (ref 80.0–100.0)
Monocytes Absolute: 0.6 10*3/uL (ref 0.1–1.0)
Monocytes Relative: 10 %
Neutro Abs: 3.1 10*3/uL (ref 1.7–7.7)
Neutrophils Relative %: 50 %
Platelet Count: 312 10*3/uL (ref 150–400)
RBC: 4.65 MIL/uL (ref 3.87–5.11)
RDW: 13.3 % (ref 11.5–15.5)
WBC Count: 6.1 10*3/uL (ref 4.0–10.5)
nRBC: 0 % (ref 0.0–0.2)

## 2021-12-08 LAB — CMP (CANCER CENTER ONLY)
ALT: 30 U/L (ref 0–44)
AST: 29 U/L (ref 15–41)
Albumin: 4.1 g/dL (ref 3.5–5.0)
Alkaline Phosphatase: 48 U/L (ref 38–126)
Anion gap: 7 (ref 5–15)
BUN: 13 mg/dL (ref 8–23)
CO2: 26 mmol/L (ref 22–32)
Calcium: 10.8 mg/dL — ABNORMAL HIGH (ref 8.9–10.3)
Chloride: 107 mmol/L (ref 98–111)
Creatinine: 0.97 mg/dL (ref 0.44–1.00)
GFR, Estimated: >60 mL/min (ref >60)
Glucose, Bld: 172 mg/dL — ABNORMAL HIGH (ref 70–99)
Potassium: 4.2 mmol/L (ref 3.5–5.1)
Sodium: 140 mmol/L (ref 135–145)
Total Bilirubin: 0.3 mg/dL (ref 0.3–1.2)
Total Protein: 7.5 g/dL (ref 6.5–8.1)

## 2021-12-08 MED ORDER — HEPARIN SOD (PORK) LOCK FLUSH 100 UNIT/ML IV SOLN
500.0000 [IU] | Freq: Once | INTRAVENOUS | Status: AC | PRN
  Administered 2021-12-08: 500 [IU]

## 2021-12-08 MED ORDER — SODIUM CHLORIDE 0.9% FLUSH
10.0000 mL | Freq: Once | INTRAVENOUS | Status: AC | PRN
  Administered 2021-12-08: 10 mL

## 2021-12-08 NOTE — Telephone Encounter (Signed)
12/08/21 next appt scheduled and confirmed with patient

## 2021-12-12 ENCOUNTER — Telehealth: Payer: Self-pay

## 2021-12-12 NOTE — Telephone Encounter (Signed)
-----   Message from [name], MD sent at 12/09/2021  9:22 AM EST ----- Regarding: call Tell her labs look good except mildly high calcium, not unusual for her, and BS 172

## 2021-12-12 NOTE — Telephone Encounter (Signed)
Patient notified of lab results. Patient is wanted to know if elevated calcium levels will make her tired.

## 2021-12-15 DIAGNOSIS — J40 Bronchitis, not specified as acute or chronic: Secondary | ICD-10-CM | POA: Insufficient documentation

## 2021-12-15 HISTORY — DX: COVID-19: J40

## 2022-01-04 ENCOUNTER — Encounter: Payer: Self-pay | Admitting: Oncology

## 2022-01-04 ENCOUNTER — Encounter: Payer: Self-pay | Admitting: Hematology and Oncology

## 2022-01-18 ENCOUNTER — Ambulatory Visit: Payer: Medicare Other | Admitting: Gastroenterology

## 2022-01-25 ENCOUNTER — Other Ambulatory Visit: Payer: Self-pay | Admitting: Gastroenterology

## 2022-02-01 ENCOUNTER — Ambulatory Visit: Payer: Medicare Other | Admitting: Cardiology

## 2022-02-13 ENCOUNTER — Encounter: Payer: Self-pay | Admitting: Hematology and Oncology

## 2022-02-14 ENCOUNTER — Encounter: Payer: Self-pay | Admitting: Oncology

## 2022-02-14 DIAGNOSIS — S32009A Unspecified fracture of unspecified lumbar vertebra, initial encounter for closed fracture: Secondary | ICD-10-CM | POA: Insufficient documentation

## 2022-02-14 DIAGNOSIS — S32020A Wedge compression fracture of second lumbar vertebra, initial encounter for closed fracture: Secondary | ICD-10-CM

## 2022-02-14 HISTORY — DX: Unspecified fracture of unspecified lumbar vertebra, initial encounter for closed fracture: S32.009A

## 2022-02-14 HISTORY — DX: Wedge compression fracture of second lumbar vertebra, initial encounter for closed fracture: S32.020A

## 2022-02-15 ENCOUNTER — Other Ambulatory Visit: Payer: Self-pay | Admitting: Oncology

## 2022-02-15 DIAGNOSIS — Z170 Estrogen receptor positive status [ER+]: Secondary | ICD-10-CM

## 2022-02-17 ENCOUNTER — Encounter: Payer: Self-pay | Admitting: Hematology and Oncology

## 2022-03-04 DIAGNOSIS — R55 Syncope and collapse: Secondary | ICD-10-CM | POA: Insufficient documentation

## 2022-03-04 HISTORY — DX: Syncope and collapse: R55

## 2022-03-06 ENCOUNTER — Telehealth: Payer: Self-pay | Admitting: Gastroenterology

## 2022-03-06 NOTE — Telephone Encounter (Signed)
Good afternoon Dr. Lyndel Safe,  Patient called wanting to see if it was possible to have a virtual visit with you on 3/8 at 3:00 instead of coming in for her visit.   Will you please advise?  Thank you.

## 2022-03-06 NOTE — Telephone Encounter (Signed)
Patient called, stating she has changed her mind and plans to come in.

## 2022-03-09 ENCOUNTER — Inpatient Hospital Stay: Payer: Medicare Other

## 2022-03-09 ENCOUNTER — Encounter: Payer: Self-pay | Admitting: Oncology

## 2022-03-09 ENCOUNTER — Other Ambulatory Visit: Payer: Self-pay | Admitting: Oncology

## 2022-03-09 ENCOUNTER — Inpatient Hospital Stay: Payer: Medicare Other | Attending: Oncology | Admitting: Oncology

## 2022-03-09 VITALS — BP 115/62 | HR 105 | Temp 98.4°F | Resp 18 | Ht 64.0 in | Wt 179.7 lb

## 2022-03-09 DIAGNOSIS — Z780 Asymptomatic menopausal state: Secondary | ICD-10-CM

## 2022-03-09 DIAGNOSIS — M8580 Other specified disorders of bone density and structure, unspecified site: Secondary | ICD-10-CM | POA: Diagnosis not present

## 2022-03-09 DIAGNOSIS — C50112 Malignant neoplasm of central portion of left female breast: Secondary | ICD-10-CM | POA: Diagnosis not present

## 2022-03-09 DIAGNOSIS — Z170 Estrogen receptor positive status [ER+]: Secondary | ICD-10-CM

## 2022-03-09 LAB — CBC WITH DIFFERENTIAL (CANCER CENTER ONLY)
Abs Immature Granulocytes: 0.06 10*3/uL (ref 0.00–0.07)
Basophils Absolute: 0 10*3/uL (ref 0.0–0.1)
Basophils Relative: 0 %
Eosinophils Absolute: 0.1 10*3/uL (ref 0.0–0.5)
Eosinophils Relative: 1 %
HCT: 42.9 % (ref 36.0–46.0)
Hemoglobin: 14.1 g/dL (ref 12.0–15.0)
Immature Granulocytes: 1 %
Lymphocytes Relative: 19 %
Lymphs Abs: 2 10*3/uL (ref 0.7–4.0)
MCH: 31.5 pg (ref 26.0–34.0)
MCHC: 32.9 g/dL (ref 30.0–36.0)
MCV: 95.8 fL (ref 80.0–100.0)
Monocytes Absolute: 0.7 10*3/uL (ref 0.1–1.0)
Monocytes Relative: 7 %
Neutro Abs: 7.6 10*3/uL (ref 1.7–7.7)
Neutrophils Relative %: 72 %
Platelet Count: 283 10*3/uL (ref 150–400)
RBC: 4.48 MIL/uL (ref 3.87–5.11)
RDW: 13.7 % (ref 11.5–15.5)
WBC Count: 10.5 10*3/uL (ref 4.0–10.5)
nRBC: 0 % (ref 0.0–0.2)

## 2022-03-09 LAB — CMP (CANCER CENTER ONLY)
ALT: 31 U/L (ref 0–44)
AST: 29 U/L (ref 15–41)
Albumin: 3.9 g/dL (ref 3.5–5.0)
Alkaline Phosphatase: 47 U/L (ref 38–126)
Anion gap: 9 (ref 5–15)
BUN: 15 mg/dL (ref 8–23)
CO2: 24 mmol/L (ref 22–32)
Calcium: 9.3 mg/dL (ref 8.9–10.3)
Chloride: 105 mmol/L (ref 98–111)
Creatinine: 0.84 mg/dL (ref 0.44–1.00)
GFR, Estimated: >60 mL/min (ref >60)
Glucose, Bld: 126 mg/dL — ABNORMAL HIGH (ref 70–99)
Potassium: 4.2 mmol/L (ref 3.5–5.1)
Sodium: 138 mmol/L (ref 135–145)
Total Bilirubin: 0.7 mg/dL (ref 0.3–1.2)
Total Protein: 7.3 g/dL (ref 6.5–8.1)

## 2022-03-09 NOTE — Progress Notes (Signed)
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 She was treated with left mastectomy.  One node was positive for malignancy.  EndoPredict has revealed her to be at extremely high risk for recurrence, with an EP clin score of 5.4.  Her risk of recurrence in the next 10 years is 55% and she has an estimated chemotherapy benefit of 27%.  She completed 4 cycles of AC chemotherapy in May.  She then received weekly paclitaxel for 8/12 cycles but had interruptions for various complications. During cycle 9, she began to have seizure activity during her pre-meds and she was transported to the hospital via EMS. She was hospitalized, later transferred and treated for meningitis. She then had increased general muscle weakness since receiving paclitaxel, and described episodes of shakiness.  CT head was negative. Keppra was increased to 750 mg twice daily, as this was felt to represent seizure activity.  Paclitaxel was held, but resumed on August 8, then paclitaxel was held again due to worsening fatigue and pain, and then stopped.  She completed her chemotherapy in October 2023, receiving most of the weekly paclitaxel, 8/12 doses. She was then placed on hormonal therapy with anastrozole.  Osteopenia Her bone density scan from February of 2023 reveals osteopenia of the spine with a T score of -1.6 and the femur is normal. She has had recent compression fractures of T10 and L2 and L3, in early February and early March 2024, from severe falls.    Plan: She completed her chemotherapy in October 2023, receiving most of the weekly  paclitaxel, 8/12 doses. We have started her on anastrozole 1 mg daily. She is due for her Prolia injection now and will continue every 6 months but we will hold off on calcium supplement since her level runs high, last time 10.8.  I have given her information about [employer] about mastectomy bras and prosthesis.She will return in 3 months with CBC and CMP, and we will plan to flush her port at that time unless she is ready to get it removed. The patient understands the plans discussed today and is in agreement with them.  She knows to contact our office if she develops concerns prior to her next appointment.   I provided 30 minutes of face-to-face time during this encounter and > 50% was spent counseling as documented under my assessment and plan.    Brandy Kaplan, MD  [facility] [address] [place] [address] Dept: [phone] Dept Fax: [phone]   No orders of the defined types were placed in this encounter.     CHIEF COMPLAINT:  CC: Stage IIA hormone receptor positive breast cancer  Current Treatment:  Adjuvant weekly paclitaxel  HISTORY OF PRESENT ILLNESS:   Oncology History  Malignant neoplasm of central portion of left breast in female, estrogen receptor positive  09/16/2020 Mammogram   DIGITAL SCREENING BILATERAL MAMMOGRAM: In the left breast, a possible mass warrants further evaluation. In the right breast, no findings suspicious for malignancy.    10/08/2020 Mammogram   DIAGNOSTIC UNILATERAL LEFT MAMMOGRAM AND LEFT BREAST  ULTRASOUND: There is architectural distortion at the left breast 12 o'clock.  Targeted ultrasound is performed, showing no focal abnormal discrete cystic or solid lesion at left breast 12 o'clock the correlate to the mammographic finding. Ultrasound of the left axilla is negative.   10/20/2020 Pathology Results   Breast, left, needle core biopsy, 12 o'clock:              Invasive mammary carcinoma             Mammary carcinoma in situ HER2: Negative (0) ER: Positive 30% PR: Negative 0% Ki67: 1%   10/26/2020 Initial Diagnosis   Malignant neoplasm of central portion of left breast in female, estrogen receptor positive ([place])   12/17/2020 Cancer Staging   Staging form: Breast, AJCC 8th Edition - Clinical stage from 12/17/2020: Stage IIA (cT1b, cN1(sn), cM0, G2, ER+, PR-, HER2-) - Signed by Brandy Kaplan, MD on 02/13/2021 Histopathologic type: Infiltrating duct carcinoma, NOS Stage prefix: Initial diagnosis Method of lymph node assessment: Sentinel lymph node biopsy Nuclear grade: G2 Multigene prognostic tests performed: EndoPredict Histologic grading system: 3 grade system Laterality: Left Tumor size (mm): 7 Lymph-vascular invasion (LVI): LVI not present (absent)/not identified Diagnostic confirmation: Positive histology Specimen type: Excision Staged by: Managing physician Menopausal status: Postmenopausal Ki-67 (%): 1 EndoPredict EPclin risk score: 5.4 EndoPredict EPclin risk level: High risk EndoPredict 10-year percentage risk of distant recurrence (%): 55 EndoPredict 10-year risk of distant recurrence: High risk Stage used in treatment planning: Yes National guidelines used in treatment planning: Yes Type of national guideline used in treatment planning: NCCN Staging comments: Endopredict with 27% benefit from chemotherapy   12/17/2020 Pathology Results   Breast, simple mastectomy, left:         Invasive ductal carcinoma, grade 2, 7 mm, and ductal carcinoma in situ         Ribbon clip and biopsy reaction present         All margins negative for invasive carcinoma and DCIS         Fibrocystic changes Lymph node, sentinel, biopsy, left axillary:         Tumor present in regional lymph node (1/1)   02/18/2021 - 08/30/2021 Chemotherapy   Patient is on Treatment Plan : Breast AC q21 Days / PACLitaxel q7d      Genetic Testing   Negative  genetic testing. No pathogenic variants identified on the [facility] Multi-Cancer+RNA panel. The report date is 03/08/2021.  The Multi-Cancer Panel + RNA offered by [facility] includes sequencing and/or deletion duplication testing of the following 84 genes: AIP, ALK, APC, ATM, AXIN2,BAP1,  BARD1, BLM, BMPR1A, BRCA1, BRCA2, BRIP1, CASR, CDC73, CDH1, CDK4, CDKN1B, CDKN1C, CDKN2A (p14ARF), CDKN2A (p16INK4a), CEBPA, CHEK2, CTNNA1, DICER1, DIS3L2, EGFR (c.2369C>T, p.Thr790Met variant only), EPCAM (Deletion/duplication testing only), FH, FLCN, GATA2, GPC3, GREM1 (Promoter region deletion/duplication testing only), HOXB13 (c.251G>A, p.Gly84Glu), HRAS, KIT, MAX, MEN1, MET, MITF (c.952G>A, p.Glu318Lys variant only), MLH1, MSH2, MSH3, MSH6, MUTYH, NBN, NF1, NF2, NTHL1, PALB2, PDGFRA, PHOX2B, PMS2, POLD1, POLE, POT1, PRKAR1A, PTCH1, PTEN, RAD50, RAD51C, RAD51D, RB1, RECQL4, RET, RUNX1, SDHAF2, SDHA (sequence changes only), SDHB, SDHC, SDHD, SMAD4, SMARCA4, SMARCB1, SMARCE1, STK11, SUFU, TERC, TERT, TMEM127, TP53, TSC1, TSC2, VHL, WRN and WT1.   02/18/2021 - 09/07/2021 Chemotherapy   Patient is on Treatment Plan : Breast AC q21 Days / PACLitaxel q7d       INTERVAL HISTORY: Brandy Maxwell is here for follow up of her stage IIA infiltrating left breast cancer, treated with left mastectomy and adjuvant chemotherapy, completed in  October 2023. She was placed on hormonal therapy with anastrozole in November and continues this without difficulty, other than occasional hot flash. She also has osteopenia and is on Prolia injections every 6 months, due this month. She had dental work in mid December, with extractions of some of her upper teeth. Since we saw her last, she has had 2 falls, one in early February and then again last week, in early March. She had syncope and fell backwards, so this has resulted in compression fractures of T10 and L2/L3. She will follow up with orthopedics next week.  Her last DEXA was in February of 2023. Her calcium  runs high, with the last one in December 10.8, so is not on supplement. CT scans of chest, abdomen and pelvis in February revealed multiple tiny pulmonary nodules up to 5 mm. I have let her know when she is ready that I am okay with her getting her port removed. She will have her port flushed after this visit. I have given her information about [employer] about mastectomy bras and prosthesis. She denies fevers or chills. She denies pain. Her appetite is good. Her weight has decreased 8 pounds over the last 3 months. Today's labs are pending.  REVIEW OF SYSTEMS:  Review of Systems  Constitutional:  Negative for appetite change, chills, fever and unexpected weight change.  HENT:   Negative for lump/mass, mouth sores and sore throat.   Respiratory:  Negative for cough and shortness of breath.   Cardiovascular:  Negative for chest pain and leg swelling.  Gastrointestinal:  Negative for abdominal pain, diarrhea, nausea and vomiting.  Endocrine: Negative for hot flashes.  Genitourinary:  Negative for difficulty urinating, dysuria, frequency and hematuria.   Musculoskeletal:  Negative for arthralgias, back pain and myalgias.  Skin:  Negative for rash.  Neurological:  Positive for extremity weakness (generalized) and numbness (bilateral feet). Negative for dizziness and headaches.  Hematological:  Negative for adenopathy. Does not bruise/bleed easily.  Psychiatric/Behavioral:  Negative for depression and sleep disturbance. The patient is not nervous/anxious.      VITALS:  Blood pressure 115/62, pulse (!) 105, temperature 98.4 F (36.9 C), temperature source Oral, resp. rate 18, height 5\' 4"  (1.626 m), weight 179 lb 11.2 oz (81.5 kg), last menstrual period 09/02/1998, SpO2 95 %.  Wt Readings from Last 3 Encounters:  03/10/22 180 lb (81.6 kg)  03/09/22 179 lb 11.2 oz (81.5 kg)  12/08/21 188 lb 4.8 oz (85.4 kg)    Body mass index is 30.85 kg/m.  Performance status (ECOG): 2 - Symptomatic,  <50% confined to bed  PHYSICAL EXAM:  Physical Exam Vitals and nursing note reviewed.  Constitutional:      General: She is not in acute distress.    Appearance: Normal appearance. She is not ill-appearing or toxic-appearing.  HENT:     Head: Normocephalic and atraumatic.     Nose: Nose normal.     Mouth/Throat:     Mouth: Mucous membranes are moist.     Pharynx: Oropharynx is clear.  Eyes:     General: No scleral icterus.    Extraocular Movements: Extraocular movements intact.     Conjunctiva/sclera: Conjunctivae normal.     Pupils: Pupils are equal, round, and reactive to light.  Cardiovascular:     Rate and Rhythm: Regular rhythm. Tachycardia present.     Pulses: Normal pulses.     Heart sounds: Normal heart sounds.  Pulmonary:     Effort: Pulmonary effort is normal.  No respiratory distress.     Breath sounds: Normal breath sounds. No wheezing or rales.  Chest:  Breasts:    Right: Normal.     Left: Absent.     Comments: Left mastectomy is negative. Well healed scar of the right breast at about 8 o'clock. Mild fibrocystic changes.  Mild erythema of the central upper chest wall No nodules Firmness of the left axilla Abdominal:     General: Bowel sounds are normal.     Palpations: Abdomen is soft.  Musculoskeletal:        General: Normal range of motion.     Cervical back: Normal range of motion.  Skin:    General: Skin is warm and dry.  Neurological:     Mental Status: She is alert and oriented to person, place, and time.  Psychiatric:        Mood and Affect: Mood normal.        Behavior: Behavior normal.        Thought Content: Thought content normal.        Judgment: Judgment normal.    LABS:      Latest Ref Rng & Units 03/09/2022    2:17 PM 12/08/2021    2:13 PM 10/10/2021   12:00 AM  CBC  WBC 4.0 - 10.5 K/uL 10.5  6.1  8.8      Hemoglobin 12.0 - 15.0 g/dL 14.1  14.7  13.8      Hematocrit 36.0 - 46.0 % 42.9  44.3  41      Platelets 150 - 400 K/uL 283   312       This result is from an external source.      Latest Ref Rng & Units 03/09/2022    2:17 PM 12/08/2021    2:13 PM 10/10/2021   12:00 AM  CMP  Glucose 70 - 99 mg/dL 126  172    BUN 8 - 23 mg/dL 15  13  18       Creatinine 0.44 - 1.00 mg/dL 0.84  0.97  0.8      Sodium 135 - 145 mmol/L 138  140  139      Potassium 3.5 - 5.1 mmol/L 4.2  4.2  4.1      Chloride 98 - 111 mmol/L 105  107  105      CO2 22 - 32 mmol/L 24  26  23       Calcium 8.9 - 10.3 mg/dL 9.3  10.8  10.4      Total Protein 6.5 - 8.1 g/dL 7.3  7.5    Total Bilirubin 0.3 - 1.2 mg/dL 0.7  0.3    Alkaline Phos 38 - 126 U/L 47  48  57      AST 15 - 41 U/L 29  29  44      ALT 0 - 44 U/L 31  30  43         This result is from an external source.     No results found for: "CEA1", "CEA" / No results found for: "CEA1", "CEA" No results found for: "PSA1" No results found for: "[id number]" No results found for: "CAN125"  No results found for: "TOTALPROTELP", "ALBUMINELP", "A1GS", "A2GS", "BETS", "BETA2SER", "GAMS", "MSPIKE", "SPEI" No results found for: "TIBC", "FERRITIN", "IRONPCTSAT" No results found for: "LDH"  STUDIES:   EXAM:02/10/21 DEXA ASSESSMENT The BMD measured at AP Spine L1-L3 is 0.990 g/cm2 with a T-score of -1.6 is considered moderately low. Fracture risk  is moderate. Treatment is advised if there are other risk factors.  With a Z--score of -0.8, this patient's BMD is considered within normal limits relative to their age. Even so, they may be considered osteopenic or osteoporotic which is normal for this age.  HISTORY:   Past Medical History:  Diagnosis Date   Abnormal mammogram of left breast 11/05/2018   Allergic rhinitis 08/30/2013   Altered consciousness 09/29/2020   Arthritis    Atypical ductal hyperplasia of right breast 05/21/2017   Cardiac murmur 09/23/2019   Chronic kidney disease    recurrent kidney infections   Closed wedge compression fracture of T10 vertebra ([place]) 03/23/2022    Coccydynia 03/24/2015   Degeneration of thoracic or thoracolumbar intervertebral disc 02/12/2013   Depression with anxiety 08/30/2013   Diet-controlled diabetes mellitus ([place]) 10/31/2019   Difficulty in walking 08/30/2013   Disorder of sacrum 02/12/2013   Dyspnea on exertion 10/29/2020   Epilepsy ([place]) 08/30/2013   Excessive daytime sleepiness 07/03/2017   Family history of breast cancer    Family history of colon cancer    Family history of first degree relative with dementia 10/26/2017   Family history of lung cancer    Family history of prostate cancer    Family history of uterine cancer    Fibromyalgia    Gait abnormality 07/10/2012   Genetic testing 03/09/2021   Negative genetic testing. No pathogenic variants identified on the [facility] Multi-Cancer+RNA panel. The report date is 03/08/2021.  The Multi-Cancer Panel + RNA offered by [facility] includes sequencing and/or deletion duplication testing of the following 84 genes: AIP, ALK, APC, ATM, AXIN2,BAP1,  BARD1, BLM, BMPR1A, BRCA1, BRCA2, BRIP1, CASR, CDC73, CDH1, CDK4, CDKN1B, CDKN1C, CDKN2A (p14ARF), CDKN2A (   Herpes zoster with nervous system complications [id number]   History of seizure disorder 03/17/2019   Hypercholesteremia    Hyperlipidemia 08/30/2013   IBS (irritable bowel syndrome)    Insomnia 09/07/2015   Malignant neoplasm of central portion of left breast in female, estrogen receptor positive ([place]) 10/26/2020   Mass of upper outer quadrant of left breast 05/21/2017   Medication management 01/31/2019   Migraine headache 08/30/2013   Mild cognitive impairment 03/17/2019   Muscle weakness (generalized) 07/25/2021   Myalgia and myositis 02/12/2013   Neurogenic bladder 05/21/2020   Neuropathic pain 07/22/2020   Neuropathy 03/17/2019   Obesity, Class I, BMI 30-34.9 03/17/2019   Obstructive sleep apnea (adult) (pediatric) 08/30/2013   Osteopenia after menopause 02/10/2021   Pain in thoracic spine 02/12/2013   Palpitations  09/23/2019   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   Postlaminectomy syndrome, lumbar region 02/12/2013   Postoperative examination 06/19/2017   Postural hypotension 09/23/2019   Recurrent urinary tract infection 08/29/2021   Restless legs syndrome 05/22/2017   Risk for falls 09/07/2015   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   Seizures ([place])    Sleep apnea    nightly cpap   Spondylosis of thoracic region without myelopathy or radiculopathy 02/12/2013   Thoracic or lumbosacral neuritis or radiculitis 02/12/2013   Thoracic spondylosis with myelopathy 02/12/2013   Tremors of nervous system    Urinary incontinence 03/17/2019   Urinary incontinence, mixed 05/29/2013   Vitamin B 12 deficiency 03/17/2019   Vitamin D deficiency 03/17/2019   Weakness 07/10/2012    Past Surgical History:  Procedure Laterality Date   BLADDER REPAIR  03/13/2018   BLADDER SUSPENSION  03/13/2018   BREAST LUMPECTOMY  2019   COLONOSCOPY  12/20/2012   Left colonic  diverticulosis predominantly in the sigmoid colon. Small internal hemmorhoids. Otherwise normal colonoscopy.    LAMINECTOMY  1998    Family History  Problem Relation Age of Onset   Hypercholesterolemia Mother    Alzheimer's disease Mother    Heart Problems Father    Clotting disorder Father    Alzheimer's disease Father    Prostate cancer Father        dx 88s   Allergic rhinitis Sister    Colon cancer Maternal Grandfather        71s   Brain cancer Paternal Grandfather        59s   Uterine cancer Maternal Aunt        dx 52s, d. 74   Breast cancer Maternal Aunt 60       recurrence, metastatic, d. 14   Hemophilia Maternal Aunt    Breast cancer Maternal Aunt        dx 4-s d. 35s   Lung cancer Maternal Uncle 50       smoker   Lung cancer Maternal Uncle        66s   Breast cancer Paternal Aunt 41       dx 36s d. 81, 3 recurrences   Liver cancer Paternal Uncle    Kidney cancer Paternal Uncle     Colon cancer Cousin    Breast cancer Cousin        dx 57s, bilateral mastectomy   Esophageal cancer Neg Hx    Stomach cancer Neg Hx    Rectal cancer Neg Hx     Social History:  reports that she has never smoked. She has never used smokeless tobacco. She reports that she does not drink alcohol and does not use drugs.The patient is accompanied by her daughter today.  Allergies:  Allergies  Allergen Reactions   Nitrofurantoin Anaphylaxis, Dermatitis, Diarrhea, Other (See Comments), Hives, Itching, Nausea Only, Palpitations, Photosensitivity, Rash, Shortness Of Breath, Swelling, Tinitus and Nausea And Vomiting    Other Reaction(s): Fatique  Other Reaction(s): Abdominal Pain, Confusion, Confusion (intolerance), Dermatitis, Dermatitis, Lethargy (intolerance), Other, Seizures   Sulfa Antibiotics Hives, Other (See Comments) and Rash    Other reaction(s): Rash  Other Reaction(s): Other (See Comments)    Other reaction(s): Rash    Current Medications: Current Outpatient Medications  Medication Sig Dispense Refill   anastrozole (ARIMIDEX) 1 MG tablet TAKE 1 TABLET BY MOUTH EVERY DAY 90 tablet 1   cephALEXin (KEFLEX) 500 MG capsule Take 500 mg by mouth 4 (four) times daily.     chlorhexidine (PERIDEX) 0.12 % solution Use as directed 5 mLs in the mouth or throat as directed.     cyclobenzaprine (FLEXERIL) 10 MG tablet Take 10 mg by mouth 3 (three) times daily as needed.     dicyclomine (BENTYL) 10 MG capsule Take 10 mg by mouth 3 (three) times daily as needed for pain. With IBS     DULoxetine (CYMBALTA) 60 MG capsule Take 1 capsule by mouth at bedtime.     EPINEPHrine 0.3 mg/0.3 mL IJ SOAJ injection Inject 0.3 mg into the muscle as needed for anaphylaxis.     ezetimibe (ZETIA) 10 MG tablet Take 10 mg by mouth daily.     fenofibrate 160 MG tablet Take 160 mg by mouth daily.     gabapentin (NEURONTIN) 600 MG tablet Take 1,200 mg by mouth 3 (three) times daily.     HYDROcodone-acetaminophen  (NORCO/VICODIN) 5-325 MG tablet Take 1 tablet by mouth in the morning and  at bedtime.     levETIRAcetam (KEPPRA) 750 MG tablet Take 750 mg by mouth 2 (two) times daily. Takes 1.5 tablets twice a day     lidocaine (LIDODERM) 5 % Place 1 patch onto the skin daily.     LINZESS 72 MCG capsule TAKE 1 CAPSULE BY MOUTH DAILY. 90 capsule 4   midodrine (PROAMATINE) 5 MG tablet Take 5 mg by mouth 3 (three) times daily with meals.     mirabegron ER (MYRBETRIQ) 50 MG TB24 tablet Take 1 tablet by mouth daily.     omeprazole (PRILOSEC OTC) 20 MG tablet Take 1 tablet by mouth daily.     pramipexole (MIRAPEX) 1 MG tablet Take 1 mg by mouth at bedtime. Takes 1/2 tab     ramelteon (ROZEREM) 8 MG tablet Take 8 mg by mouth at bedtime.     Vitamin D, Ergocalciferol, (DRISDOL) 1.25 MG (50000 UT) CAPS capsule TAKE 1 CAPSULE BY MOUTH ONCE A WEEK FOR 30 DAYS     vitamin E 1000 UNIT capsule Take 1,000 Units by mouth daily.     zinc gluconate 50 MG tablet Take 50 mg by mouth daily.     calcitonin, salmon, (MIACALCIN/FORTICAL) 200 UNIT/ACT nasal spray Place 1 spray into alternate nostrils daily.     linaclotide (LINZESS) 145 MCG CAPS capsule Take 1 capsule (145 mcg total) by mouth daily before breakfast. 90 capsule 4   traZODone (DESYREL) 50 MG tablet Take 50 mg by mouth at bedtime.     No current facility-administered medications for this visit.         I,Gabriella Ballesteros,acting as a scribe for Brandy Kaplan, MD.,have documented all relevant documentation on the behalf of Brandy Kaplan, MD,as directed by  Brandy Kaplan, MD while in the presence of Brandy Kaplan, MD.]

## 2022-03-10 ENCOUNTER — Encounter: Payer: Self-pay | Admitting: Gastroenterology

## 2022-03-10 ENCOUNTER — Telehealth: Payer: Self-pay

## 2022-03-10 ENCOUNTER — Telehealth: Payer: Self-pay | Admitting: Oncology

## 2022-03-10 ENCOUNTER — Encounter: Payer: Self-pay | Admitting: Hematology and Oncology

## 2022-03-10 ENCOUNTER — Ambulatory Visit (INDEPENDENT_AMBULATORY_CARE_PROVIDER_SITE_OTHER): Payer: Medicare Other | Admitting: Gastroenterology

## 2022-03-10 VITALS — BP 124/74 | HR 110 | Ht 65.0 in | Wt 180.0 lb

## 2022-03-10 DIAGNOSIS — G8929 Other chronic pain: Secondary | ICD-10-CM

## 2022-03-10 DIAGNOSIS — R102 Pelvic and perineal pain: Secondary | ICD-10-CM | POA: Diagnosis not present

## 2022-03-10 DIAGNOSIS — K581 Irritable bowel syndrome with constipation: Secondary | ICD-10-CM | POA: Diagnosis not present

## 2022-03-10 MED ORDER — LINACLOTIDE 145 MCG PO CAPS: 145.0000 ug | ORAL_CAPSULE | Freq: Every day | ORAL | 4 refills | Status: DC

## 2022-03-10 NOTE — Telephone Encounter (Signed)
Contacted pt to schedule an appt. Unable to reach via phone, voicemail was left.    Scheduling Message Entered by Juanetta Beets on 03/10/2022 at  1:02 PM Priority: Routine <No visit type provided>  Department: [facility]  Provider:  Appointment Notes:  Please schedule pt for prolia injection sometime the end of next week in order to give [place] time to get auth.  She recently had labs so no need for lab appt.  Scheduling Notes:

## 2022-03-10 NOTE — Patient Instructions (Signed)
_______________________________________________________  If your blood pressure at your visit was 140/90 or greater, please contact your primary care physician to follow up on this.  _______________________________________________________  If you are age 66 or older, your body mass index should be between 23-30. Your Body mass index is 29.95 kg/m. If this is out of the aforementioned range listed, please consider follow up with your Primary Care Provider.  If you are age 28 or younger, your body mass index should be between 19-25. Your Body mass index is 29.95 kg/m. If this is out of the aformentioned range listed, please consider follow up with your Primary Care Provider.   ________________________________________________________  The [facility] providers would like to encourage you to use [facility] to communicate with providers for non-urgent requests or questions.  Due to long hold times on the telephone, sending your provider a message by [facility] may be a faster and more efficient way to get a response.  Please allow 48 business hours for a response.  Please remember that this is for non-urgent requests.  _______________________________________________________  We have sent the following medications to your pharmacy for you to pick up at your convenience: Linzess 120mg  Please purchase the following medications over the counter and take as directed: Preparation H  Please call with any questions or concerns.  Thank you,  Dr. RJackquline Denmark

## 2022-03-10 NOTE — Progress Notes (Signed)
Chief Complaint:   Referring Provider:  Enid Skeens., MD      ASSESSMENT AND PLAN;     #1. Chronic pelvic pain. S/P bladder urethral sling 03/2018.  Being followed by urology (Dr. Venia Minks). Neg CT Abdo/pelvis January 2023.  #2. IBS-C (now component of IOC)   Plan: -Increased Linzess 145 mcg p.o. QD #90, 4 refills -If still with constipation, add MiraLAX. -prep H prn  -Minimize pain medications. -Call if any problems.    HPI:    Brandy Maxwell is a 67 y.o. female with breast CA, chronic fatigue syndrome, fibromyalgia, chronic pain syndrome being followed by pain clinic, H LD, OSA on CPAP, seizure D/O with longstanding urinary problems including urinary incontinence SP retropubic mid urethral sling on 03/13/2018 with continued urge incontinence and chronic pelvic pain.  Unfortunately had COVID x 2, thereafter flu Complicated by post COVID POTS She also fell and had compression fracture of the back Is currently on narcotics Linzess 72 was working previously.  This made her constipation worse  She also had problems with hemorrhoids-felt something was coming out. Rectal exam today did not reveal any external hemorrhoids.  Small internal hemorrhoids.  No rectal prolapse.  Stool heme-negative.  Denies having any significant nausea or vomiting.  No melena or hematochezia.  She attributes part of the pelvic pain to "colonic spasms".  Very much concerned as her maternal grandmother had colon cancer.   Past GI procedures:  CT chest/Abdo/pelvis 01/26/2021 at [facility] -Hepatic steatosis -No acute abnormalities. -No metastatic disease.  Colon 02/2019 -Mild left colonic diverticulosis. -Non-bleeding internal hemorrhoids. -Otherwise normal colonoscopy to TI. -Rpt in 10 yrs  -Colonoscopy 12/2012 ([facility])-mild sigmoid diverticulosis, small internal hemorrhoids.  [place]:  Diagnosis Date   Abnormal mammogram of left breast 11/05/2018    Allergic rhinitis 08/30/2013   Altered consciousness 09/29/2020   Arthritis    Atypical ductal hyperplasia of right breast 05/21/2017   Cardiac murmur 09/23/2019   Chronic kidney disease    recurrent kidney infections   Coccydynia 03/24/2015   Degeneration of thoracic or thoracolumbar intervertebral disc 02/12/2013   Depression with anxiety 08/30/2013   Diet-controlled diabetes mellitus ([place] 10/31/2019   Difficulty in walking 08/30/2013   Disorder of sacrum 02/12/2013   Dyspnea on exertion 10/29/2020   Epilepsy ([place] 08/30/2013   Excessive daytime sleepiness 07/03/2017   Family history of breast cancer    Family history of colon cancer    Family history of first degree relative with dementia 10/26/2017   Family history of lung cancer    Family history of prostate cancer    Family history of uterine cancer    Fibromyalgia    Gait abnormality 07/10/2012   Genetic testing 03/09/2021   Negative genetic testing. No pathogenic variants identified on the [facility] Multi-Cancer+RNA panel. The report date is 03/08/2021.  The Multi-Cancer Panel + RNA offered by [facility] includes sequencing and/or deletion duplication testing of the following 84 genes: AIP, ALK, APC, ATM, AXIN2,BAP1,  BARD1, BLM, BMPR1A, BRCA1, BRCA2, BRIP1, CASR, CDC73, CDH1, CDK4, CDKN1B, CDKN1C, CDKN2A (p14ARF), CDKN2A (   Herpes zoster with nervous system complications [id number]  History of seizure disorder 03/17/2019   Hypercholesteremia    Hyperlipidemia 08/30/2013   IBS (irritable bowel syndrome)    Insomnia 09/07/2015   Malignant neoplasm of central portion of left breast in female, estrogen receptor positive ([place] 10/26/2020   Mass of upper outer quadrant of left breast 05/21/2017  Medication management 01/31/2019   Migraine headache 08/30/2013   Mild cognitive impairment 03/17/2019   Muscle weakness (generalized) 07/25/2021   Myalgia and myositis 02/12/2013   Neurogenic bladder 05/21/2020   Neuropathic pain  07/22/2020   Neuropathy 03/17/2019   Obesity, Class I, BMI 30-34.9 03/17/2019   Obstructive sleep apnea (adult) (pediatric) 08/30/2013   Pain in thoracic spine 02/12/2013   Palpitations 09/23/2019   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   Postlaminectomy syndrome, lumbar region 02/12/2013   Postoperative examination 06/19/2017   Postural hypotension 09/23/2019   Recurrent urinary tract infection 08/29/2021   Restless legs syndrome 05/22/2017   Risk for falls 09/07/2015   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   Seizures ([place])    Sleep apnea    nightly cpap   Spondylosis of thoracic region without myelopathy or radiculopathy 02/12/2013   Thoracic or lumbosacral neuritis or radiculitis 02/12/2013   Thoracic spondylosis with myelopathy 02/12/2013   Tremors of nervous system    Urinary incontinence 03/17/2019   Urinary incontinence, mixed 05/29/2013   Vitamin B 12 deficiency 03/17/2019   Vitamin D deficiency 03/17/2019   Weakness 07/10/2012    Past Surgical History:  Procedure Laterality Date   BLADDER REPAIR  03/13/2018   BLADDER SUSPENSION  03/13/2018   BREAST LUMPECTOMY  2019   COLONOSCOPY  12/20/2012   Left colonic diverticulosis predominantly in the sigmoid colon. Small internal hemmorhoids. Otherwise normal colonoscopy.    LAMINECTOMY  1998    Family History  Problem Relation Age of Onset   Hypercholesterolemia Mother    Alzheimer's disease Mother    Heart Problems Father    Clotting disorder Father    Alzheimer's disease Father    Prostate cancer Father        dx 36s   Allergic rhinitis Sister    Colon cancer Maternal Grandfather        31s   Brain cancer Paternal Grandfather        74s   Uterine cancer Maternal Aunt        dx 53s, d. 31   Breast cancer Maternal Aunt 60       recurrence, metastatic, d. 75   Hemophilia Maternal Aunt    Breast cancer Maternal Aunt        dx 4-s d. 4s   Lung cancer Maternal Uncle 50        smoker   Lung cancer Maternal Uncle        63s   Breast cancer Paternal Aunt 41       dx 46s d. 51, 3 recurrences   Liver cancer Paternal Uncle    Kidney cancer Paternal Uncle    Colon cancer Cousin    Breast cancer Cousin        dx 37s, bilateral mastectomy   Esophageal cancer Neg Hx    Stomach cancer Neg Hx    Rectal cancer Neg Hx     Social History   Tobacco Use   Smoking status: Never   Smokeless tobacco: Never  Vaping Use   Vaping Use: Never used  Substance Use Topics   Alcohol use: Never   Drug use: Never    Current Outpatient Medications  Medication Sig Dispense Refill   anastrozole (ARIMIDEX) 1 MG tablet TAKE 1 TABLET BY MOUTH EVERY DAY 90 tablet 1   cephALEXin (KEFLEX) 500 MG capsule Take 500 mg by mouth 4 (four) times daily.     chlorhexidine (PERIDEX) 0.12 % solution as directed.  cyclobenzaprine (FLEXERIL) 10 MG tablet Take 10 mg by mouth 3 (three) times daily as needed.     dicyclomine (BENTYL) 10 MG capsule Take 10 mg by mouth 3 (three) times daily as needed for pain. With IBS     DULoxetine (CYMBALTA) 60 MG capsule Take 1 capsule by mouth at bedtime.     EPINEPHrine 0.3 mg/0.3 mL IJ SOAJ injection Inject 0.3 mg into the muscle as needed for anaphylaxis.     ezetimibe (ZETIA) 10 MG tablet Take 10 mg by mouth daily.     fenofibrate 160 MG tablet Take 160 mg by mouth daily.     gabapentin (NEURONTIN) 600 MG tablet Take 1,200 mg by mouth 3 (three) times daily.     HYDROcodone-acetaminophen (NORCO/VICODIN) 5-325 MG tablet Take 1 tablet by mouth in the morning and at bedtime.     levETIRAcetam (KEPPRA) 750 MG tablet Take 750 mg by mouth 2 (two) times daily. Takes 1.5 tablets twice a day     lidocaine (LIDODERM) 5 % Place onto the skin.     LINZESS 72 MCG capsule TAKE 1 CAPSULE BY MOUTH DAILY. 90 capsule 4   midodrine (PROAMATINE) 5 MG tablet Take 5 mg by mouth 3 (three) times daily with meals.     mirabegron ER (MYRBETRIQ) 50 MG TB24 tablet Take 1 tablet by  mouth daily.     omeprazole (PRILOSEC OTC) 20 MG tablet Take 1 tablet by mouth daily.     pramipexole (MIRAPEX) 1 MG tablet Take 1 mg by mouth at bedtime. Takes 1/2 tab     ramelteon (ROZEREM) 8 MG tablet Take 8 mg by mouth at bedtime.     Vitamin D, Ergocalciferol, (DRISDOL) 1.25 MG (50000 UT) CAPS capsule TAKE 1 CAPSULE BY MOUTH ONCE A WEEK FOR 30 DAYS     vitamin E 1000 UNIT capsule Take 1,000 Units by mouth daily.     zinc gluconate 50 MG tablet Take 50 mg by mouth daily.     No current facility-administered medications for this visit.    Allergies  Allergen Reactions   Nitrofurantoin Anaphylaxis, Dermatitis, Diarrhea, Other (See Comments), Hives, Itching, Nausea Only, Palpitations, Photosensitivity, Rash, Shortness Of Breath, Swelling, Tinitus and Nausea And Vomiting    Other Reaction(s): Fatique  Other Reaction(s): Abdominal Pain, Confusion, Confusion (intolerance), Dermatitis, Dermatitis, Lethargy (intolerance), Other, Seizures   Sulfa Antibiotics Hives, Other (See Comments) and Rash    Other reaction(s): Rash  Other Reaction(s): Other (See Comments)    Other reaction(s): Rash    Review of Systems:  neg     Physical Exam:    BP 124/74   Pulse (!) 110   Ht '5\' 5"'$  (1.651 m)   Wt 180 lb (81.6 kg)   LMP 09/02/1998 (Approximate)   BMI 29.95 kg/m  Wt Readings from Last 3 Encounters:  03/10/22 180 lb (81.6 kg)  03/09/22 179 lb 11.2 oz (81.5 kg)  12/08/21 188 lb 4.8 oz (85.4 kg)  Gen: awake, alert, NAD HEENT: anicteric, no pallor CV: RRR, no mrg Pulm: CTA b/l Abd: soft, NT/ND, +BS throughout Rectal examination-in presence of Brooke, normal.  Small internal hemorrhoids.  Slightly hard stools.  Brown heme-negative stools. Ext: no c/c/e Neuro: nonfocal      Carmell Austria, MD 03/10/2022, 3:26 PM  Cc: Enid Skeens., MD

## 2022-03-10 NOTE — Telephone Encounter (Signed)
-----   Message from Belva Chimes, LPN sent at 0/08/1446  2:45 PM EST ----- Regarding: FW: call  ----- Message ----- From: [name], MD Sent: 03/10/2022   1:15 PM EST To: Belva Chimes, LPN Subject: call                                           Tell her labs look great

## 2022-03-10 NOTE — Addendum Note (Signed)
Addended by: Juanetta Beets on: 03/10/2022 01:08 PM   Modules accepted: Orders

## 2022-03-10 NOTE — Telephone Encounter (Signed)
Message left labs look good, call with any further questions.

## 2022-03-14 ENCOUNTER — Encounter: Payer: Self-pay | Admitting: Hematology and Oncology

## 2022-03-15 ENCOUNTER — Other Ambulatory Visit: Payer: Self-pay | Admitting: Pharmacist

## 2022-03-15 ENCOUNTER — Inpatient Hospital Stay: Payer: Medicare Other

## 2022-03-15 VITALS — BP 124/70 | HR 106 | Temp 98.5°F | Resp 18

## 2022-03-15 DIAGNOSIS — M8580 Other specified disorders of bone density and structure, unspecified site: Secondary | ICD-10-CM | POA: Diagnosis not present

## 2022-03-15 DIAGNOSIS — C50112 Malignant neoplasm of central portion of left female breast: Secondary | ICD-10-CM

## 2022-03-15 MED ORDER — DENOSUMAB 60 MG/ML ~~LOC~~ SOSY
60.0000 mg | PREFILLED_SYRINGE | Freq: Once | SUBCUTANEOUS | Status: AC
  Administered 2022-03-15: 60 mg via SUBCUTANEOUS
  Filled 2022-03-15: qty 1

## 2022-03-15 NOTE — Patient Instructions (Signed)
Denosumab Injection (Osteoporosis) What is this medication? DENOSUMAB (den oh SUE mab) prevents and treats osteoporosis. It works by making your bones stronger and less likely to break (fracture). It is a monoclonal antibody. This medicine may be used for other purposes; ask your health care provider or pharmacist if you have questions. COMMON BRAND NAME(S): Prolia What should I tell my care team before I take this medication? They need to know if you have any of these conditions: Dental or gum disease, or plan to have dental surgery or a tooth pulled Infection Kidney disease Low levels of calcium or vitamin D in your blood On dialysis Poor nutrition Skin conditions Thyroid disease, or have had thyroid or parathyroid surgery Trouble absorbing minerals in your stomach or intestine An unusual or allergic reaction to denosumab, other medications, foods, dyes, or preservatives Pregnant or trying to get pregnant Breastfeeding How should I use this medication? This medication is injected under the skin. It is given by your care team in a hospital or clinic setting. A special MedGuide will be given to you before each treatment. Be sure to read this information carefully each time. Talk to your care team about the use of this medication in children. Special care may be needed. Overdosage: If you think you have taken too much of this medicine contact a poison control center or emergency room at once. NOTE: This medicine is only for you. Do not share this medicine with others. What if I miss a dose? Keep appointments for follow-up doses. It is important not to miss your dose. Call your care team if you are unable to keep an appointment. What may interact with this medication? Do not take this medication with any of the following: Other medications that contain denosumab This medication may also interact with the following: Medications that lower your chance of fighting infection Steroid  medications, such as prednisone or cortisone This list may not describe all possible interactions. Give your health care provider a list of all the medicines, herbs, non-prescription drugs, or dietary supplements you use. Also tell them if you smoke, drink alcohol, or use illegal drugs. Some items may interact with your medicine. What should I watch for while using this medication? Your condition will be monitored carefully while you are receiving this medication. You may need blood work while taking this medication. This medication may increase your risk of getting an infection. Call your care team for advice if you get a fever, chills, sore throat, or other symptoms of a cold or flu. Do not treat yourself. Try to avoid being around people who are sick. Tell your dentist and dental surgeon that you are taking this medication. You should not have major dental surgery while on this medication. See your dentist to have a dental exam and fix any dental problems before starting this medication. Take good care of your teeth while on this medication. Make sure you see your dentist for regular follow-up appointments. You should make sure you get enough calcium and vitamin D while you are taking this medication. Discuss the foods you eat and the vitamins you take with your care team. Talk to your care team if you are pregnant or think you might be pregnant. This medication can cause serious birth defects if taken during pregnancy and for 5 months after the last dose. You will need a negative pregnancy test before starting this medication. Contraception is recommended while taking this medication and for 5 months after the last dose. Your care   team can help you find the option that works for you. Talk to your care team before breastfeeding. Changes to your treatment plan may be needed. What side effects may I notice from receiving this medication? Side effects that you should report to your care team as soon as  possible: Allergic reactions--skin rash, itching, hives, swelling of the face, lips, tongue, or throat Infection--fever, chills, cough, sore throat, wounds that don't heal, pain or trouble when passing urine, general feeling of discomfort or being unwell Low calcium level--muscle pain or cramps, confusion, tingling, or numbness in the hands or feet Osteonecrosis of the jaw--pain, swelling, or redness in the mouth, numbness of the jaw, poor healing after dental work, unusual discharge from the mouth, visible bones in the mouth Severe bone, joint, or muscle pain Skin infection--skin redness, swelling, warmth, or pain Side effects that usually do not require medical attention (report these to your care team if they continue or are bothersome): Back pain Headache Joint pain Muscle pain Pain in the hands, arms, legs, or feet Runny or stuffy nose Sore throat This list may not describe all possible side effects. Call your doctor for medical advice about side effects. You may report side effects to FDA at 1-800-FDA-1088. Where should I keep my medication? This medication is given in a hospital or clinic. It will not be stored at home. NOTE: This sheet is a summary. It may not cover all possible information. If you have questions about this medicine, talk to your doctor, pharmacist, or health care provider.  2023 Elsevier/Gold Standard (2021-05-02 00:00:00)  

## 2022-03-15 NOTE — Progress Notes (Signed)
No calcium supplement at this time despite denosumab due to her calcium level being high per Dr. Hinton Rao.  We will continue to monitor her levels.

## 2022-03-16 ENCOUNTER — Encounter: Payer: Self-pay | Admitting: Hematology and Oncology

## 2022-03-21 ENCOUNTER — Encounter: Payer: Self-pay | Admitting: Hematology and Oncology

## 2022-03-23 DIAGNOSIS — S22070A Wedge compression fracture of T9-T10 vertebra, initial encounter for closed fracture: Secondary | ICD-10-CM

## 2022-03-23 HISTORY — DX: Wedge compression fracture of T9-T10 vertebra, initial encounter for closed fracture: S22.070A

## 2022-03-29 ENCOUNTER — Other Ambulatory Visit: Payer: Self-pay | Admitting: Cardiology

## 2022-03-31 ENCOUNTER — Other Ambulatory Visit: Payer: Self-pay

## 2022-04-03 ENCOUNTER — Encounter: Payer: Self-pay | Admitting: Hematology and Oncology

## 2022-04-05 ENCOUNTER — Encounter: Payer: Self-pay | Admitting: Hematology and Oncology

## 2022-04-10 ENCOUNTER — Ambulatory Visit: Payer: Medicare Other | Attending: Cardiology | Admitting: Cardiology

## 2022-04-10 ENCOUNTER — Encounter: Payer: Self-pay | Admitting: Cardiology

## 2022-04-10 ENCOUNTER — Other Ambulatory Visit: Payer: Self-pay | Admitting: Oncology

## 2022-04-10 VITALS — BP 116/70 | HR 108 | Ht 65.0 in | Wt 184.6 lb

## 2022-04-10 DIAGNOSIS — I700 Atherosclerosis of aorta: Secondary | ICD-10-CM | POA: Insufficient documentation

## 2022-04-10 DIAGNOSIS — E782 Mixed hyperlipidemia: Secondary | ICD-10-CM | POA: Diagnosis not present

## 2022-04-10 DIAGNOSIS — R002 Palpitations: Secondary | ICD-10-CM | POA: Diagnosis not present

## 2022-04-10 DIAGNOSIS — E669 Obesity, unspecified: Secondary | ICD-10-CM | POA: Insufficient documentation

## 2022-04-10 DIAGNOSIS — R0609 Other forms of dyspnea: Secondary | ICD-10-CM | POA: Diagnosis not present

## 2022-04-10 DIAGNOSIS — Z170 Estrogen receptor positive status [ER+]: Secondary | ICD-10-CM

## 2022-04-10 HISTORY — DX: Atherosclerosis of aorta: I70.0

## 2022-04-10 NOTE — Progress Notes (Signed)
Cardiology Office Note:    Date:  04/10/2022   ID:  Brandy Maxwell, DOB 11/14/1955, MRN [id number]  PCP:  Nonnie DoneSlatosky, John J., MD  Cardiologist:  Garwin Brothersajan R Leotha Voeltz, MD   Referring MD: Nonnie DoneSlatosky, John J., MD    ASSESSMENT:    1. Dyspnea on exertion   2. Palpitations   3. Aortic atherosclerosis   4. Mixed hyperlipidemia   5. Obesity (BMI 30.0-34.9)    PLAN:    In order of problems listed above:  Primary prevention stressed with the patient.  Importance of compliance with diet medication stressed and she vocalized understanding. Aortic atherosclerosis: I discussed this with her.  Secondary prevention stressed. Mixed dyslipidemia: On lipid-lowering medications followed by primary care.  Lipids were reviewed. Obesity: Weight reduction was stressed and she promises to do better.  Risks of obesity explained. Patient will be seen in follow-up appointment in 6 months or earlier if the patient has any concerns.    Medication Adjustments/Labs and Tests Ordered: Current medicines are reviewed at length with the patient today.  Concerns regarding medicines are outlined above.  Orders Placed This Encounter  Procedures   EKG 12-Lead   No orders of the defined types were placed in this encounter.    No chief complaint on file.    History of Present Illness:    Brandy Maxwell is a 67 y.o. female.  Patient has past medical history of aortic atherosclerosis, hyperlipidemia and obesity.  She mentions to me that she fell down and has fractures in her vertebrae.  She is seeing orthopedic doctors for this.  No chest pain orthopnea or PND.  Her palpitations are resolved.  Past Medical History:  Diagnosis Date   Abnormal mammogram of left breast 11/05/2018   Allergic rhinitis 08/30/2013   Altered consciousness 09/29/2020   Arthritis    Atypical ductal hyperplasia of right breast 05/21/2017   Cardiac murmur 09/23/2019   Chronic kidney disease    recurrent kidney infections   Closed wedge  compression fracture of T10 vertebra 03/23/2022   Coccydynia 03/24/2015   Degeneration of thoracic or thoracolumbar intervertebral disc 02/12/2013   Depression with anxiety 08/30/2013   Diet-controlled diabetes mellitus 10/31/2019   Difficulty in walking 08/30/2013   Disorder of sacrum 02/12/2013   Dyspnea on exertion 10/29/2020   Epilepsy 08/30/2013   Excessive daytime sleepiness 07/03/2017   Family history of breast cancer    Family history of colon cancer    Family history of first degree relative with dementia 10/26/2017   Family history of lung cancer    Family history of prostate cancer    Family history of uterine cancer    Fibromyalgia    Gait abnormality 07/10/2012   Genetic testing 03/09/2021   Negative genetic testing. No pathogenic variants identified on the [facility] Multi-Cancer+RNA panel. The report date is 03/08/2021.  The Multi-Cancer Panel + RNA offered by [facility] includes sequencing and/or deletion duplication testing of the following 84 genes: AIP, ALK, APC, ATM, AXIN2,BAP1,  BARD1, BLM, BMPR1A, BRCA1, BRCA2, BRIP1, CASR, CDC73, CDH1, CDK4, CDKN1B, CDKN1C, CDKN2A (p14ARF), CDKN2A (   Herpes zoster with nervous system complications 02/12/2013   History of seizure disorder 03/17/2019   Hypercholesteremia    Hyperlipidemia 08/30/2013   IBS (irritable bowel syndrome)    Insomnia 09/07/2015   Malignant neoplasm of central portion of left breast in female, estrogen receptor positive 10/26/2020   Mass of upper outer quadrant of left breast 05/21/2017   Medication management 01/31/2019   Migraine headache 08/30/2013  Mild cognitive impairment 03/17/2019   Muscle weakness (generalized) 07/25/2021   Myalgia and myositis 02/12/2013   Neurogenic bladder 05/21/2020   Neuropathic pain 07/22/2020   Neuropathy 03/17/2019   Obesity, Class I, BMI 30-34.9 03/17/2019   Obstructive sleep apnea (adult) (pediatric) 08/30/2013   Osteopenia after menopause 02/10/2021   Pain in thoracic  spine 02/12/2013   Palpitations 09/23/2019   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   Postlaminectomy syndrome, lumbar region 02/12/2013   Postoperative examination 06/19/2017   Postural hypotension 09/23/2019   Recurrent urinary tract infection 08/29/2021   Restless legs syndrome 05/22/2017   Risk for falls 09/07/2015   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   Seizures    Sleep apnea    nightly cpap   Spondylosis of thoracic region without myelopathy or radiculopathy 02/12/2013   Thoracic or lumbosacral neuritis or radiculitis 02/12/2013   Thoracic spondylosis with myelopathy 02/12/2013   Tremors of nervous system    Urinary incontinence 03/17/2019   Urinary incontinence, mixed 05/29/2013   Vitamin B 12 deficiency 03/17/2019   Vitamin D deficiency 03/17/2019   Weakness 07/10/2012    Past Surgical History:  Procedure Laterality Date   BLADDER REPAIR  03/13/2018   BLADDER SUSPENSION  03/13/2018   BREAST LUMPECTOMY  2019   COLONOSCOPY  12/20/2012   Left colonic diverticulosis predominantly in the sigmoid colon. Small internal hemmorhoids. Otherwise normal colonoscopy.    LAMINECTOMY  1998    Current Medications: Current Meds  Medication Sig   anastrozole (ARIMIDEX) 1 MG tablet TAKE 1 TABLET BY MOUTH EVERY DAY   calcitonin, salmon, (MIACALCIN/FORTICAL) 200 UNIT/ACT nasal spray Place 1 spray into alternate nostrils daily.   chlorhexidine (PERIDEX) 0.12 % solution Use as directed 5 mLs in the mouth or throat as directed.   cyclobenzaprine (FLEXERIL) 10 MG tablet Take 10 mg by mouth 3 (three) times daily as needed.   denosumab (XGEVA) 120 MG/1.7ML SOLN injection Inject 120 mg into the skin every 6 (six) months.   dicyclomine (BENTYL) 10 MG capsule Take 10 mg by mouth 3 (three) times daily as needed for pain. With IBS   DULoxetine (CYMBALTA) 60 MG capsule Take 1 capsule by mouth at bedtime.   EPINEPHrine 0.3 mg/0.3 mL IJ SOAJ injection Inject 0.3  mg into the muscle as needed for anaphylaxis.   ezetimibe (ZETIA) 10 MG tablet Take 10 mg by mouth daily.   fenofibrate 160 MG tablet Take 160 mg by mouth daily.   gabapentin (NEURONTIN) 600 MG tablet Take 1,200 mg by mouth 3 (three) times daily.   HYDROcodone-acetaminophen (NORCO/VICODIN) 5-325 MG tablet Take 1 tablet by mouth in the morning and at bedtime.   levETIRAcetam (KEPPRA) 750 MG tablet Take 1.5 tablets by mouth 2 (two) times daily.   lidocaine (LIDODERM) 5 % Place 1 patch onto the skin daily.   linaclotide (LINZESS) 145 MCG CAPS capsule Take 145 mcg by mouth as needed (severe constipation).   LINZESS 72 MCG capsule TAKE 1 CAPSULE BY MOUTH DAILY.   midodrine (PROAMATINE) 10 MG tablet Take 10 mg by mouth 3 (three) times daily.   mirabegron ER (MYRBETRIQ) 50 MG TB24 tablet Take 1 tablet by mouth daily.   omeprazole (PRILOSEC OTC) 20 MG tablet Take 1 tablet by mouth daily.   pramipexole (MIRAPEX) 1 MG tablet Take 1 mg by mouth at bedtime. Takes 1/2 tab   traZODone (DESYREL) 50 MG tablet Take 50 mg by mouth at bedtime.   Vitamin D, Ergocalciferol, (DRISDOL) 1.25 MG (50000 UT)  CAPS capsule TAKE 1 CAPSULE BY MOUTH ONCE A WEEK FOR 30 DAYS   vitamin E 1000 UNIT capsule Take 1,000 Units by mouth daily.   zinc gluconate 50 MG tablet Take 50 mg by mouth daily.     Allergies:   Nitrofurantoin and Sulfa antibiotics   Social History   Socioeconomic History   Marital status: Married    Spouse name: Not on file   Number of children: 6   Years of education: Not on file   Highest education level: Not on file  Occupational History   Not on file  Tobacco Use   Smoking status: Never   Smokeless tobacco: Never  Vaping Use   Vaping Use: Never used  Substance and Sexual Activity   Alcohol use: Never   Drug use: Never   Sexual activity: Not on file  Other Topics Concern   Not on file  Social History Narrative   Not on file   Social Determinants of Health   Financial Resource Strain:  Not on file  Food Insecurity: Not on file  Transportation Needs: Not on file  Physical Activity: Not on file  Stress: Not on file  Social Connections: Not on file     Family History: The patient's family history includes Allergic rhinitis in her sister; Alzheimer's disease in her father and mother; Brain cancer in her paternal grandfather; Breast cancer in her cousin and maternal aunt; Breast cancer (age of onset: 78) in her paternal aunt; Breast cancer (age of onset: 42) in her maternal aunt; Clotting disorder in her father; Colon cancer in her cousin and maternal grandfather; Heart Problems in her father; Hemophilia in her maternal aunt; Hypercholesterolemia in her mother; Kidney cancer in her paternal uncle; Liver cancer in her paternal uncle; Lung cancer in her maternal uncle; Lung cancer (age of onset: 11) in her maternal uncle; Prostate cancer in her father; Uterine cancer in her maternal aunt. There is no history of Esophageal cancer, Stomach cancer, or Rectal cancer.  ROS:   Please see the history of present illness.    All other systems reviewed and are negative.  EKGs/Labs/Other Studies Reviewed:    The following studies were reviewed today: EKG reveals sinus tachycardia with nonspecific ST-T   Recent Labs: 03/09/2022: ALT 31; BUN 15; Creatinine 0.84; Hemoglobin 14.1; Platelet Count 283; Potassium 4.2; Sodium 138  Recent Lipid Panel No results found for: "CHOL", "TRIG", "HDL", "CHOLHDL", "VLDL", "LDLCALC", "LDLDIRECT"  Physical Exam:    VS:  BP 116/70   Pulse (!) 108   Ht 5\' 5"  (1.651 m)   Wt 184 lb 9.6 oz (83.7 kg)   LMP 09/02/1998 (Approximate)   SpO2 96%   BMI 30.72 kg/m     Wt Readings from Last 3 Encounters:  04/10/22 184 lb 9.6 oz (83.7 kg)  03/10/22 180 lb (81.6 kg)  03/09/22 179 lb 11.2 oz (81.5 kg)     GEN: Patient is in no acute distress HEENT: Normal NECK: No JVD; No carotid bruits LYMPHATICS: No lymphadenopathy CARDIAC: Hear sounds regular, 2/6  systolic murmur at the apex. RESPIRATORY:  Clear to auscultation without rales, wheezing or rhonchi  ABDOMEN: Soft, non-tender, non-distended MUSCULOSKELETAL:  No edema; No deformity  SKIN: Warm and dry NEUROLOGIC:  Alert and oriented x 3 PSYCHIATRIC:  Normal affect   Signed, Garwin Brothers, MD  04/10/2022 4:52 PM    [facility]

## 2022-04-10 NOTE — Patient Instructions (Signed)
Medication Instructions:  Your physician recommends that you continue on your current medications as directed. Please refer to the Current Medication list given to you today.  *If you need a refill on your cardiac medications before your next appointment, please call your pharmacy*   Lab Work: None If you have labs (blood work) drawn today and your tests are completely normal, you will receive your results only by: MyChart Message (if you have MyChart) OR A paper copy in the mail If you have any lab test that is abnormal or we need to change your treatment, we will call you to review the results.   Testing/Procedures: None   Follow-Up: At [facility], you and your health needs are our priority.  As part of our continuing mission to provide you with exceptional heart care, we have created designated Provider Care Teams.  These Care Teams include your primary Cardiologist (physician) and Advanced Practice Providers (APPs -  Physician Assistants and Nurse Practitioners) who all work together to provide you with the care you need, when you need it.  We recommend signing up for the patient portal called "MyChart".  Sign up information is provided on this After Visit Summary.  MyChart is used to connect with patients for Virtual Visits (Telemedicine).  Patients are able to view lab/test results, encounter notes, upcoming appointments, etc.  Non-urgent messages can be sent to your provider as well.   To learn more about what you can do with MyChart, go to [email or web address].    Your next appointment:   9 month(s)  Provider:   Rajan Revankar, MD    Other Instructions None  

## 2022-04-19 ENCOUNTER — Other Ambulatory Visit: Payer: Self-pay | Admitting: Oncology

## 2022-05-31 ENCOUNTER — Other Ambulatory Visit: Payer: Self-pay

## 2022-05-31 MED ORDER — LINACLOTIDE 72 MCG PO CAPS: 72.0000 ug | ORAL_CAPSULE | Freq: Every day | ORAL | 4 refills | Status: DC

## 2022-05-31 NOTE — Progress Notes (Signed)
[facility]  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 She was treated with left mastectomy.  One node was positive for malignancy.  EndoPredict has revealed her to be at extremely high risk for recurrence, with an EP clin score of 5.4.  Her risk of recurrence in the next 10 years is 55% and she has an estimated chemotherapy benefit of 27%.  She completed 4 cycles of AC chemotherapy in May.  She then received weekly paclitaxel for 8/12 cycles but had interruptions for various complications. During cycle 9, she began to have seizure activity during her pre-meds and she was transported to the hospital via EMS. She was hospitalized, later transferred and treated for meningitis. She then had increased general muscle weakness since receiving paclitaxel, and described episodes of shakiness.  CT head was negative. Keppra was increased to 750 mg twice daily, as this was felt to represent seizure activity.  Paclitaxel was held, but resumed on August 8, then paclitaxel was held again due to worsening fatigue and pain, and then stopped.  She completed her chemotherapy in October 2023, receiving most of the weekly paclitaxel, 8/12 doses. She was then placed on hormonal therapy with anastrozole.  Osteopenia Her bone density scan from February of 2023 reveals osteopenia of the spine with a T score of -1.6 and the femur is normal with a T-score of 0.1. She has had recent compression fractures of T10 and L2 and L3, in early February and early March 2024, from severe falls. She had kyphoplasty of T10 on 4/25 at [facility].  Plan:  Patient  continues anastrozole 1 mg daily. She informed me that she had kyphoplasty done on her spine, T10 on April, 25 and says that her pain is already resolving. Her last bone density scan was on 02/10/2021, with a T-score of -1.6 of the spine and normal for the femur. She will be due for her next bone density scan in September, 2025.  She will have her right mammogram in September and follow-up with Dr. Georgiana Shore. Her labs today are pending. I will see her in 3 months with CBC, CMP, and Prolia injection. The patient understands the plans discussed today and is in agreement with them.  She knows to contact our office if she develops concerns prior to her next appointment.  I provided 30 minutes of face-to-face time during this encounter and > 50% was spent counseling as documented under my assessment and plan.    Dellia Beckwith, MD  [facility] AT [facility] [address] [place] [address] Dept: [phone] Dept Fax: [phone]   No orders of the defined types were placed in this encounter.     CHIEF COMPLAINT:  CC: Stage IIA hormone receptor positive breast cancer  Current Treatment:  Anastrozole, Prolia injections  HISTORY OF PRESENT ILLNESS:   Oncology History  Malignant neoplasm of central portion of left breast in female, estrogen receptor positive (HCC)  09/16/2020 Mammogram   DIGITAL SCREENING BILATERAL MAMMOGRAM: In the left breast, a possible mass warrants further evaluation. In the right breast, no findings suspicious for malignancy.  10/08/2020 Mammogram   DIAGNOSTIC UNILATERAL LEFT MAMMOGRAM AND LEFT BREAST ULTRASOUND: There is architectural distortion at the left breast 12 o'clock.  Targeted ultrasound is performed, showing no focal abnormal discrete cystic or solid lesion at left breast 12 o'clock the correlate to the mammographic finding. Ultrasound of the left axilla is negative.   10/20/2020 Pathology Results    Breast, left, needle core biopsy, 12 o'clock:             Invasive mammary carcinoma             Mammary carcinoma in situ HER2: Negative (0) ER: Positive 30% PR: Negative 0% Ki67: 1%   10/26/2020 Initial Diagnosis   Malignant neoplasm of central portion of left breast in female, estrogen receptor positive (HCC)   12/17/2020 Cancer Staging   Staging form: Breast, AJCC 8th Edition - Clinical stage from 12/17/2020: Stage IIA (cT1b, cN1(sn), cM0, G2, ER+, PR-, HER2-) - Signed by Dellia Beckwith, MD on 02/13/2021 Histopathologic type: Infiltrating duct carcinoma, NOS Stage prefix: Initial diagnosis Method of lymph node assessment: Sentinel lymph node biopsy Nuclear grade: G2 Multigene prognostic tests performed: EndoPredict Histologic grading system: 3 grade system Laterality: Left Tumor size (mm): 7 Lymph-vascular invasion (LVI): LVI not present (absent)/not identified Diagnostic confirmation: Positive histology Specimen type: Excision Staged by: Managing physician Menopausal status: Postmenopausal Ki-67 (%): 1 EndoPredict EPclin risk score: 5.4 EndoPredict EPclin risk level: High risk EndoPredict 10-year percentage risk of distant recurrence (%): 55 EndoPredict 10-year risk of distant recurrence: High risk Stage used in treatment planning: Yes National guidelines used in treatment planning: Yes Type of national guideline used in treatment planning: NCCN Staging comments: Endopredict with 27% benefit from chemotherapy   12/17/2020 Pathology Results   Breast, simple mastectomy, left:         Invasive ductal carcinoma, grade 2, 7 mm, and ductal carcinoma in situ         Ribbon clip and biopsy reaction present         All margins negative for invasive carcinoma and DCIS         Fibrocystic changes Lymph node, sentinel, biopsy, left axillary:         Tumor present in regional lymph node (1/1)   02/18/2021 - 08/30/2021 Chemotherapy   Patient is on Treatment Plan : Breast AC  q21 Days / PACLitaxel q7d      Genetic Testing   Negative genetic testing. No pathogenic variants identified on the [facility] Multi-Cancer+RNA panel. The report date is 03/08/2021.  The Multi-Cancer Panel + RNA offered by [facility] includes sequencing and/or deletion duplication testing of the following 84 genes: AIP, ALK, APC, ATM, AXIN2,BAP1,  BARD1, BLM, BMPR1A, BRCA1, BRCA2, BRIP1, CASR, CDC73, CDH1, CDK4, CDKN1B, CDKN1C, CDKN2A (p14ARF), CDKN2A (p16INK4a), CEBPA, CHEK2, CTNNA1, DICER1, DIS3L2, EGFR (c.2369C>T, p.Thr790Met variant only), EPCAM (Deletion/duplication testing only), FH, FLCN, GATA2, GPC3, GREM1 (Promoter region deletion/duplication testing only), HOXB13 (c.251G>A, p.Gly84Glu), HRAS, KIT, MAX, MEN1, MET, MITF (c.952G>A, p.Glu318Lys variant only), MLH1, MSH2, MSH3, MSH6, MUTYH, NBN, NF1, NF2, NTHL1, PALB2, PDGFRA, PHOX2B, PMS2, POLD1, POLE, POT1, PRKAR1A, PTCH1, PTEN, RAD50, RAD51C, RAD51D, RB1, RECQL4, RET, RUNX1, SDHAF2, SDHA (sequence changes only), SDHB, SDHC, SDHD, SMAD4, SMARCA4, SMARCB1, SMARCE1, STK11, SUFU, TERC, TERT, TMEM127, TP53, TSC1, TSC2, VHL, WRN and WT1.   02/18/2021 - 09/07/2021 Chemotherapy   Patient is on Treatment Plan : Breast AC q21 Days / PACLitaxel q7d       INTERVAL HISTORY: [name] is here for follow up of her stage IIA infiltrating left  breast cancer, treated with left mastectomy and adjuvant chemotherapy, completed in October 2023. She was placed on hormonal therapy with anastrozole in November, 2023 and continues this without difficulty. Patient states that she is well and has no complaints of pain. She informed me that she had kyphoplasty done on her spine, T10 on April, 25 and says that her pain is already resolving. Her last bone density scan was on 02/10/2021, with a T-score of -1.6 of the spine and normal for the femur. She will have her right mammogram in September and follow-up with Dr. Georgiana Shore. Her labs today are pending. I will see her in 3 months with CBC,  CMP, and Prolia injection. She denies signs of infection such as sore throat, sinus drainage, cough, or urinary symptoms.  She denies fevers or recurrent chills. She denies pain. She denies nausea, vomiting, chest pain, dyspnea or cough. Her appetite is good and her weight has increased 2 pounds over last month .This patient is accompanied in the office by her  granddaughter .  REVIEW OF SYSTEMS:  Review of Systems  Constitutional: Negative.  Negative for appetite change, chills, diaphoresis, fatigue, fever and unexpected weight change.  HENT:  Negative.  Negative for hearing loss, lump/mass, mouth sores, nosebleeds, sore throat, tinnitus, trouble swallowing and voice change.   Eyes: Negative.  Negative for eye problems and icterus.  Respiratory: Negative.  Negative for chest tightness, cough, hemoptysis, shortness of breath and wheezing.   Cardiovascular: Negative.  Negative for chest pain, leg swelling and palpitations.  Gastrointestinal: Negative.  Negative for abdominal distention, abdominal pain, blood in stool, constipation, diarrhea, nausea, rectal pain and vomiting.  Endocrine: Positive for hot flashes.  Genitourinary: Negative.  Negative for bladder incontinence, difficulty urinating, dyspareunia, dysuria, frequency, hematuria, menstrual problem, nocturia, pelvic pain, vaginal bleeding and vaginal discharge.   Musculoskeletal:  Negative for arthralgias, back pain, flank pain, gait problem, myalgias, neck pain and neck stiffness.  Skin: Negative.  Negative for itching, rash and wound.  Neurological:  Positive for extremity weakness (generalized) and numbness (bilateral feet). Negative for dizziness, gait problem, headaches, light-headedness, seizures and speech difficulty.  Hematological: Negative.  Negative for adenopathy. Does not bruise/bleed easily.  Psychiatric/Behavioral: Negative.  Negative for confusion, decreased concentration, depression, sleep disturbance and suicidal ideas. The  patient is not nervous/anxious.      VITALS:  Blood pressure 126/60, pulse 98, temperature 98.1 F (36.7 C), temperature source Oral, resp. rate 18, height 5\' 5"  (1.651 m), weight 186 lb 8 oz (84.6 kg), last menstrual period 09/02/1998, SpO2 97 %.  Wt Readings from Last 3 Encounters:  06/01/22 186 lb 8 oz (84.6 kg)  04/10/22 184 lb 9.6 oz (83.7 kg)  03/10/22 180 lb (81.6 kg)    Body mass index is 31.04 kg/m.  Performance status (ECOG): 2 - Symptomatic, <50% confined to bed  PHYSICAL EXAM:  Physical Exam Vitals and nursing note reviewed. Exam conducted with a chaperone present.  Constitutional:      General: She is not in acute distress.    Appearance: Normal appearance. She is normal weight. She is not ill-appearing, toxic-appearing or diaphoretic.  HENT:     Head: Normocephalic and atraumatic.     Right Ear: Tympanic membrane, ear canal and external ear normal. There is no impacted cerumen.     Left Ear: Tympanic membrane, ear canal and external ear normal. There is no impacted cerumen.     Nose: Nose normal. No congestion or rhinorrhea.     Mouth/Throat:  Mouth: Mucous membranes are moist.     Pharynx: Oropharynx is clear. No oropharyngeal exudate or posterior oropharyngeal erythema.  Eyes:     General: No scleral icterus.       Right eye: No discharge.        Left eye: No discharge.     Extraocular Movements: Extraocular movements intact.     Conjunctiva/sclera: Conjunctivae normal.     Pupils: Pupils are equal, round, and reactive to light.  Neck:     Vascular: No carotid bruit.  Cardiovascular:     Rate and Rhythm: Normal rate and regular rhythm.     Pulses: Normal pulses.     Heart sounds: Normal heart sounds. No murmur heard.    No friction rub. No gallop.  Pulmonary:     Effort: Pulmonary effort is normal. No respiratory distress.     Breath sounds: Normal breath sounds. No stridor. No wheezing, rhonchi or rales.  Chest:     Chest wall: No tenderness.   Breasts:    Right: Normal.     Left: Absent.     Comments: Left mastectomy is negative. Right breast has a well healed scar in the lateral aspect at about 9 o'clock.  Abdominal:     General: Bowel sounds are normal. There is no distension.     Palpations: Abdomen is soft. There is no hepatomegaly, splenomegaly or mass.     Tenderness: There is no abdominal tenderness. There is no right CVA tenderness, left CVA tenderness, guarding or rebound.     Hernia: No hernia is present.  Musculoskeletal:        General: No swelling, tenderness, deformity or signs of injury. Normal range of motion.     Cervical back: Normal range of motion and neck supple. No rigidity or tenderness.     Right lower leg: No edema.     Left lower leg: No edema.  Lymphadenopathy:     Cervical: No cervical adenopathy.     Right cervical: No superficial, deep or posterior cervical adenopathy.    Left cervical: No superficial, deep or posterior cervical adenopathy.     Upper Body:     Right upper body: No supraclavicular, axillary or pectoral adenopathy.     Left upper body: No supraclavicular, axillary or pectoral adenopathy.  Skin:    General: Skin is warm and dry.     Coloration: Skin is not jaundiced or pale.     Findings: No bruising, erythema, lesion or rash.  Neurological:     General: No focal deficit present.     Mental Status: She is alert and oriented to person, place, and time. Mental status is at baseline.     Cranial Nerves: No cranial nerve deficit.     Sensory: No sensory deficit.     Motor: No weakness.     Coordination: Coordination normal.     Gait: Gait normal.     Deep Tendon Reflexes: Reflexes normal.  Psychiatric:        Mood and Affect: Mood normal.        Behavior: Behavior normal.        Thought Content: Thought content normal.        Judgment: Judgment normal.    LABS:   Component Ref Range & Units 04/27/2022  WBC 3.7 - 11.0 thou/mcL 5.9  RBC 4.01 - 4.90 million/mcL 4.20   HGB 12.2 - 14.9 gm/dL 16.1  HCT 09.6 - 04.5 % 39.5  MCV 82 - 98 fL  94  MCH 27.0 - 33.0 pg 31.7  MCHC 31.0 - 37.0 gm/dL 16.1  Plt Ct 096 - 045 thou/mcL 227   Component Ref Range & Units 04/27/2022  PT 11.8 - 14.3 second(s) 13.6  INR See Therapeutic ranges 1.0   Component Ref Range & Units 04/27/2022  Glucose, POC 70 - 99 mg/dL 409 High   OPERATOR ID [id number]  INSTRUMENT ID [id number]      Latest Ref Rng & Units 06/01/2022    1:42 PM 03/09/2022    2:17 PM 12/08/2021    2:13 PM  CBC  WBC 4.0 - 10.5 K/uL 6.9  10.5  6.1   Hemoglobin 12.0 - 15.0 g/dL 21.3  08.6  57.8   Hematocrit 36.0 - 46.0 % 41.8  42.9  44.3   Platelets 150 - 400 K/uL 303  283  312       Latest Ref Rng & Units 06/01/2022    1:42 PM 03/09/2022    2:17 PM 12/08/2021    2:13 PM  CMP  Glucose 70 - 99 mg/dL 469  629  528   BUN 8 - 23 mg/dL 14  15  13    Creatinine 0.44 - 1.00 mg/dL 4.13  2.44  0.10   Sodium 135 - 145 mmol/L 137  138  140   Potassium 3.5 - 5.1 mmol/L 3.9  4.2  4.2   Chloride 98 - 111 mmol/L 102  105  107   CO2 22 - 32 mmol/L 25  24  26    Calcium 8.9 - 10.3 mg/dL 9.8  9.3  27.2   Total Protein 6.5 - 8.1 g/dL 7.4  7.3  7.5   Total Bilirubin 0.3 - 1.2 mg/dL 0.4  0.7  0.3   Alkaline Phos 38 - 126 U/L 38  47  48   AST 15 - 41 U/L 28  29  29    ALT 0 - 44 U/L 29  31  30       No results found for: "CEA1", "CEA" / No results found for: "CEA1", "CEA" No results found for: "PSA1" No results found for: "ZDG644" No results found for: "CAN125"  No results found for: "TOTALPROTELP", "ALBUMINELP", "A1GS", "A2GS", "BETS", "BETA2SER", "GAMS", "MSPIKE", "SPEI" No results found for: "TIBC", "FERRITIN", "IRONPCTSAT" No results found for: "LDH"  STUDIES:    HISTORY:   Past Medical History:  Diagnosis Date   Abnormal mammogram of left breast 11/05/2018   Allergic rhinitis 08/30/2013   Altered consciousness 09/29/2020   Arthritis    Atypical ductal hyperplasia of right breast 05/21/2017    Cardiac murmur 09/23/2019   Chronic kidney disease    recurrent kidney infections   Closed wedge compression fracture of T10 vertebra (HCC) 03/23/2022   Coccydynia 03/24/2015   Degeneration of thoracic or thoracolumbar intervertebral disc 02/12/2013   Depression with anxiety 08/30/2013   Diet-controlled diabetes mellitus (HCC) 10/31/2019   Difficulty in walking 08/30/2013   Disorder of sacrum 02/12/2013   Dyspnea on exertion 10/29/2020   Epilepsy (HCC) 08/30/2013   Excessive daytime sleepiness 07/03/2017   Family history of breast cancer    Family history of colon cancer    Family history of first degree relative with dementia 10/26/2017   Family history of lung cancer    Family history of prostate cancer    Family history of uterine cancer    Fibromyalgia    Gait abnormality 07/10/2012   Genetic testing 03/09/2021   Negative genetic testing. No pathogenic variants identified on the  [facility] Multi-Cancer+RNA panel. The report date is 03/08/2021.  The Multi-Cancer Panel + RNA offered by [facility] includes sequencing and/or deletion duplication testing of the following 84 genes: AIP, ALK, APC, ATM, AXIN2,BAP1,  BARD1, BLM, BMPR1A, BRCA1, BRCA2, BRIP1, CASR, CDC73, CDH1, CDK4, CDKN1B, CDKN1C, CDKN2A (p14ARF), CDKN2A (   Herpes zoster with nervous system complications 02/12/2013   History of seizure disorder 03/17/2019   Hypercholesteremia    Hyperlipidemia 08/30/2013   IBS (irritable bowel syndrome)    Insomnia 09/07/2015   Malignant neoplasm of central portion of left breast in female, estrogen receptor positive (HCC) 10/26/2020   Mass of upper outer quadrant of left breast 05/21/2017   Medication management 01/31/2019   Migraine headache 08/30/2013   Mild cognitive impairment 03/17/2019   Muscle weakness (generalized) 07/25/2021   Myalgia and myositis 02/12/2013   Neurogenic bladder 05/21/2020   Neuropathic pain 07/22/2020   Neuropathy 03/17/2019   Obesity, Class I, BMI 30-34.9  03/17/2019   Obstructive sleep apnea (adult) (pediatric) 08/30/2013   Osteopenia after menopause 02/10/2021   Pain in thoracic spine 02/12/2013   Palpitations 09/23/2019   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   Postlaminectomy syndrome, lumbar region 02/12/2013   Postoperative examination 06/19/2017   Postural hypotension 09/23/2019   Recurrent urinary tract infection 08/29/2021   Restless legs syndrome 05/22/2017   Risk for falls 09/07/2015   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   Seizures (HCC)    Sleep apnea    nightly cpap   Spondylosis of thoracic region without myelopathy or radiculopathy 02/12/2013   Thoracic or lumbosacral neuritis or radiculitis 02/12/2013   Thoracic spondylosis with myelopathy 02/12/2013   Tremors of nervous system    Urinary incontinence 03/17/2019   Urinary incontinence, mixed 05/29/2013   Vitamin B 12 deficiency 03/17/2019   Vitamin D deficiency 03/17/2019   Weakness 07/10/2012    Past Surgical History:  Procedure Laterality Date   BLADDER REPAIR  03/13/2018   BLADDER SUSPENSION  03/13/2018   BREAST LUMPECTOMY  2019   COLONOSCOPY  12/20/2012   Left colonic diverticulosis predominantly in the sigmoid colon. Small internal hemmorhoids. Otherwise normal colonoscopy.    LAMINECTOMY  1998    Family History  Problem Relation Age of Onset   Hypercholesterolemia Mother    Alzheimer's disease Mother    Heart Problems Father    Clotting disorder Father    Alzheimer's disease Father    Prostate cancer Father        dx 56s   Allergic rhinitis Sister    Colon cancer Maternal Grandfather        33s   Brain cancer Paternal Grandfather        32s   Uterine cancer Maternal Aunt        dx 88s, d. 63   Breast cancer Maternal Aunt 60       recurrence, metastatic, d. 65   Hemophilia Maternal Aunt    Breast cancer Maternal Aunt        dx 4-s d. 38s   Lung cancer Maternal Uncle 50       smoker   Lung cancer  Maternal Uncle        16s   Breast cancer Paternal Aunt 50       dx 81s d. 89, 3 recurrences   Liver cancer Paternal Uncle    Kidney cancer Paternal Uncle    Colon cancer Cousin    Breast cancer Cousin        dx 60s,  bilateral mastectomy   Esophageal cancer Neg Hx    Stomach cancer Neg Hx    Rectal cancer Neg Hx     Social History:  reports that she has never smoked. She has never used smokeless tobacco. She reports that she does not drink alcohol and does not use drugs.The patient is accompanied by her daughter today.  Allergies:  Allergies  Allergen Reactions   Nitrofurantoin Anaphylaxis, Dermatitis, Diarrhea, Other (See Comments), Hives, Itching, Nausea Only, Palpitations, Photosensitivity, Rash, Shortness Of Breath, Swelling, Tinitus and Nausea And Vomiting    Other Reaction(s): Fatique  Other Reaction(s): Abdominal Pain, Confusion, Confusion (intolerance), Dermatitis, Dermatitis, Lethargy (intolerance), Other, Seizures   Sulfa Antibiotics Hives, Other (See Comments) and Rash    Other reaction(s): Rash  Other Reaction(s): Other (See Comments)    Other reaction(s): Rash    Current Medications: Current Outpatient Medications  Medication Sig Dispense Refill   anastrozole (ARIMIDEX) 1 MG tablet TAKE 1 TABLET BY MOUTH EVERY DAY 90 tablet 1   calcitonin, salmon, (MIACALCIN/FORTICAL) 200 UNIT/ACT nasal spray Place 1 spray into alternate nostrils daily.     chlorhexidine (PERIDEX) 0.12 % solution Use as directed 5 mLs in the mouth or throat as directed.     cyclobenzaprine (FLEXERIL) 10 MG tablet Take 10 mg by mouth 3 (three) times daily as needed.     denosumab (XGEVA) 120 MG/1.7ML SOLN injection Inject 120 mg into the skin every 6 (six) months.     dicyclomine (BENTYL) 10 MG capsule Take 10 mg by mouth 3 (three) times daily as needed for pain. With IBS     DULoxetine (CYMBALTA) 60 MG capsule Take 1 capsule by mouth at bedtime.     EPINEPHrine 0.3 mg/0.3 mL IJ SOAJ injection  Inject 0.3 mg into the muscle as needed for anaphylaxis.     ezetimibe (ZETIA) 10 MG tablet Take 10 mg by mouth daily.     fenofibrate 160 MG tablet Take 160 mg by mouth daily.     gabapentin (NEURONTIN) 600 MG tablet Take 1,200 mg by mouth 3 (three) times daily.     HYDROcodone-acetaminophen (NORCO/VICODIN) 5-325 MG tablet Take 1 tablet by mouth in the morning and at bedtime.     levETIRAcetam (KEPPRA) 750 MG tablet Take 1.5 tablets by mouth 2 (two) times daily.     lidocaine (LIDODERM) 5 % Place 1 patch onto the skin daily.     linaclotide (LINZESS) 145 MCG CAPS capsule Take 145 mcg by mouth as needed (severe constipation).     linaclotide (LINZESS) 72 MCG capsule Take 1 capsule (72 mcg total) by mouth daily before breakfast. 90 capsule 4   LINZESS 72 MCG capsule TAKE 1 CAPSULE BY MOUTH DAILY. 90 capsule 4   midodrine (PROAMATINE) 10 MG tablet Take 10 mg by mouth 3 (three) times daily.     mirabegron ER (MYRBETRIQ) 50 MG TB24 tablet Take 1 tablet by mouth daily.     omeprazole (PRILOSEC OTC) 20 MG tablet Take 1 tablet by mouth daily.     ondansetron (ZOFRAN) 8 MG tablet TAKE 1 TABLET (8 MG TOTAL) BY MOUTH 2 (TWO) TIMES DAILY AS NEEDED. START ON THE THIRD DAY AFTER CHEMOTHERAPY. 30 tablet 1   pramipexole (MIRAPEX) 1 MG tablet Take 1 mg by mouth at bedtime. Takes 1/2 tab     traZODone (DESYREL) 50 MG tablet Take 50 mg by mouth at bedtime.     Vitamin D, Ergocalciferol, (DRISDOL) 1.25 MG (50000 UT) CAPS capsule TAKE 1 CAPSULE BY  MOUTH ONCE A WEEK FOR 30 DAYS     vitamin E 1000 UNIT capsule Take 1,000 Units by mouth daily.     zinc gluconate 50 MG tablet Take 50 mg by mouth daily.     No current facility-administered medications for this visit.     I,Jasmine M Lassiter,acting as a scribe for Dellia Beckwith, MD.,have documented all relevant documentation on the behalf of Dellia Beckwith, MD,as directed by  Dellia Beckwith, MD while in the presence of Dellia Beckwith, MD.

## 2022-05-31 NOTE — Progress Notes (Signed)
Pt said seh does linzess daily and when bad will do . Sent in for her

## 2022-06-01 ENCOUNTER — Other Ambulatory Visit: Payer: Self-pay | Admitting: Oncology

## 2022-06-01 ENCOUNTER — Inpatient Hospital Stay (INDEPENDENT_AMBULATORY_CARE_PROVIDER_SITE_OTHER): Payer: Medicare Other | Admitting: Oncology

## 2022-06-01 ENCOUNTER — Telehealth: Payer: Self-pay

## 2022-06-01 ENCOUNTER — Inpatient Hospital Stay: Payer: Medicare Other | Attending: Oncology

## 2022-06-01 ENCOUNTER — Encounter: Payer: Self-pay | Admitting: Oncology

## 2022-06-01 VITALS — BP 126/60 | HR 98 | Temp 98.1°F | Resp 18 | Ht 65.0 in | Wt 186.5 lb

## 2022-06-01 DIAGNOSIS — Z170 Estrogen receptor positive status [ER+]: Secondary | ICD-10-CM | POA: Insufficient documentation

## 2022-06-01 DIAGNOSIS — C50112 Malignant neoplasm of central portion of left female breast: Secondary | ICD-10-CM

## 2022-06-01 DIAGNOSIS — Z79811 Long term (current) use of aromatase inhibitors: Secondary | ICD-10-CM | POA: Insufficient documentation

## 2022-06-01 DIAGNOSIS — Z79899 Other long term (current) drug therapy: Secondary | ICD-10-CM | POA: Diagnosis not present

## 2022-06-01 DIAGNOSIS — M8589 Other specified disorders of bone density and structure, multiple sites: Secondary | ICD-10-CM | POA: Diagnosis not present

## 2022-06-01 DIAGNOSIS — Z9012 Acquired absence of left breast and nipple: Secondary | ICD-10-CM | POA: Insufficient documentation

## 2022-06-01 LAB — CBC WITH DIFFERENTIAL (CANCER CENTER ONLY)
Abs Immature Granulocytes: 0.04 10*3/uL (ref 0.00–0.07)
Basophils Absolute: 0 10*3/uL (ref 0.0–0.1)
Basophils Relative: 0 %
Eosinophils Absolute: 0.1 10*3/uL (ref 0.0–0.5)
Eosinophils Relative: 1 %
HCT: 41.8 % (ref 36.0–46.0)
Hemoglobin: 13.6 g/dL (ref 12.0–15.0)
Immature Granulocytes: 1 %
Lymphocytes Relative: 31 %
Lymphs Abs: 2.1 10*3/uL (ref 0.7–4.0)
MCH: 31.5 pg (ref 26.0–34.0)
MCHC: 32.5 g/dL (ref 30.0–36.0)
MCV: 96.8 fL (ref 80.0–100.0)
Monocytes Absolute: 0.6 10*3/uL (ref 0.1–1.0)
Monocytes Relative: 9 %
Neutro Abs: 4 10*3/uL (ref 1.7–7.7)
Neutrophils Relative %: 58 %
Platelet Count: 303 10*3/uL (ref 150–400)
RBC: 4.32 MIL/uL (ref 3.87–5.11)
RDW: 13.2 % (ref 11.5–15.5)
WBC Count: 6.9 10*3/uL (ref 4.0–10.5)
nRBC: 0 % (ref 0.0–0.2)

## 2022-06-01 LAB — CMP (CANCER CENTER ONLY)
ALT: 29 U/L (ref 0–44)
AST: 28 U/L (ref 15–41)
Albumin: 4.1 g/dL (ref 3.5–5.0)
Alkaline Phosphatase: 38 U/L (ref 38–126)
Anion gap: 10 (ref 5–15)
BUN: 14 mg/dL (ref 8–23)
CO2: 25 mmol/L (ref 22–32)
Calcium: 9.8 mg/dL (ref 8.9–10.3)
Chloride: 102 mmol/L (ref 98–111)
Creatinine: 0.89 mg/dL (ref 0.44–1.00)
GFR, Estimated: >60 mL/min (ref >60)
Glucose, Bld: 105 mg/dL — ABNORMAL HIGH (ref 70–99)
Potassium: 3.9 mmol/L (ref 3.5–5.1)
Sodium: 137 mmol/L (ref 135–145)
Total Bilirubin: 0.4 mg/dL (ref 0.3–1.2)
Total Protein: 7.4 g/dL (ref 6.5–8.1)

## 2022-06-01 NOTE — Telephone Encounter (Signed)
Patient and family member states that patients port was flushed when she had her Kyphoplasty done on 04/27/22 at [facility].

## 2022-06-02 ENCOUNTER — Telehealth: Payer: Self-pay

## 2022-06-02 NOTE — Telephone Encounter (Signed)
Patient notified

## 2022-06-02 NOTE — Telephone Encounter (Signed)
-----   Message from Dellia Beckwith, MD sent at 06/02/2022  1:57 PM EDT ----- Regarding: call Tell her labs look good -can't recall if I already told you to call

## 2022-06-08 ENCOUNTER — Other Ambulatory Visit: Payer: Medicare Other

## 2022-06-08 ENCOUNTER — Ambulatory Visit: Payer: Medicare Other | Admitting: Oncology

## 2022-06-13 ENCOUNTER — Encounter: Payer: Self-pay | Admitting: Oncology

## 2022-06-14 ENCOUNTER — Encounter: Payer: Self-pay | Admitting: Hematology and Oncology

## 2022-06-19 ENCOUNTER — Encounter: Payer: Self-pay | Admitting: Hematology and Oncology

## 2022-06-27 ENCOUNTER — Other Ambulatory Visit: Payer: Self-pay | Admitting: Cardiology

## 2022-07-17 ENCOUNTER — Encounter: Payer: Self-pay | Admitting: Hematology and Oncology

## 2022-07-31 DIAGNOSIS — M4802 Spinal stenosis, cervical region: Secondary | ICD-10-CM | POA: Insufficient documentation

## 2022-07-31 DIAGNOSIS — M47812 Spondylosis without myelopathy or radiculopathy, cervical region: Secondary | ICD-10-CM | POA: Insufficient documentation

## 2022-07-31 DIAGNOSIS — M5020 Other cervical disc displacement, unspecified cervical region: Secondary | ICD-10-CM

## 2022-07-31 HISTORY — DX: Spinal stenosis, cervical region: M48.02

## 2022-07-31 HISTORY — DX: Other cervical disc displacement, unspecified cervical region: M50.20

## 2022-07-31 HISTORY — DX: Spondylosis without myelopathy or radiculopathy, cervical region: M47.812

## 2022-08-02 ENCOUNTER — Other Ambulatory Visit: Payer: Self-pay | Admitting: Oncology

## 2022-08-02 DIAGNOSIS — Z170 Estrogen receptor positive status [ER+]: Secondary | ICD-10-CM

## 2022-08-22 DIAGNOSIS — M5412 Radiculopathy, cervical region: Secondary | ICD-10-CM

## 2022-08-22 HISTORY — DX: Radiculopathy, cervical region: M54.12

## 2022-08-29 ENCOUNTER — Encounter: Payer: Self-pay | Admitting: Hematology and Oncology

## 2022-08-29 ENCOUNTER — Inpatient Hospital Stay: Payer: Medicare Other | Attending: Hematology and Oncology | Admitting: Hematology and Oncology

## 2022-08-29 ENCOUNTER — Inpatient Hospital Stay: Payer: Medicare Other

## 2022-08-29 VITALS — BP 84/61 | HR 92 | Temp 98.2°F | Resp 20 | Ht 65.0 in | Wt 184.8 lb

## 2022-08-29 DIAGNOSIS — Z170 Estrogen receptor positive status [ER+]: Secondary | ICD-10-CM

## 2022-08-29 DIAGNOSIS — Z79811 Long term (current) use of aromatase inhibitors: Secondary | ICD-10-CM | POA: Diagnosis not present

## 2022-08-29 DIAGNOSIS — N390 Urinary tract infection, site not specified: Secondary | ICD-10-CM

## 2022-08-29 DIAGNOSIS — M8580 Other specified disorders of bone density and structure, unspecified site: Secondary | ICD-10-CM | POA: Diagnosis not present

## 2022-08-29 DIAGNOSIS — Z780 Asymptomatic menopausal state: Secondary | ICD-10-CM

## 2022-08-29 DIAGNOSIS — Z87440 Personal history of urinary (tract) infections: Secondary | ICD-10-CM | POA: Insufficient documentation

## 2022-08-29 DIAGNOSIS — C50112 Malignant neoplasm of central portion of left female breast: Secondary | ICD-10-CM | POA: Insufficient documentation

## 2022-08-29 DIAGNOSIS — Z79624 Long term (current) use of inhibitors of nucleotide synthesis: Secondary | ICD-10-CM | POA: Insufficient documentation

## 2022-08-29 DIAGNOSIS — Z79899 Other long term (current) drug therapy: Secondary | ICD-10-CM | POA: Insufficient documentation

## 2022-08-29 DIAGNOSIS — Z9012 Acquired absence of left breast and nipple: Secondary | ICD-10-CM | POA: Insufficient documentation

## 2022-08-29 LAB — CBC WITH DIFFERENTIAL (CANCER CENTER ONLY)
Abs Immature Granulocytes: 0.02 10*3/uL (ref 0.00–0.07)
Basophils Absolute: 0 10*3/uL (ref 0.0–0.1)
Basophils Relative: 0 %
Eosinophils Absolute: 0.1 10*3/uL (ref 0.0–0.5)
Eosinophils Relative: 1 %
HCT: 41.7 % (ref 36.0–46.0)
Hemoglobin: 13.3 g/dL (ref 12.0–15.0)
Immature Granulocytes: 0 %
Lymphocytes Relative: 34 %
Lymphs Abs: 1.6 10*3/uL (ref 0.7–4.0)
MCH: 31 pg (ref 26.0–34.0)
MCHC: 31.9 g/dL (ref 30.0–36.0)
MCV: 97.2 fL (ref 80.0–100.0)
Monocytes Absolute: 0.4 10*3/uL (ref 0.1–1.0)
Monocytes Relative: 9 %
Neutro Abs: 2.7 10*3/uL (ref 1.7–7.7)
Neutrophils Relative %: 56 %
Platelet Count: 287 10*3/uL (ref 150–400)
RBC: 4.29 MIL/uL (ref 3.87–5.11)
RDW: 13.2 % (ref 11.5–15.5)
WBC Count: 4.8 10*3/uL (ref 4.0–10.5)
nRBC: 0 % (ref 0.0–0.2)

## 2022-08-29 LAB — CMP (CANCER CENTER ONLY)
ALT: 28 U/L (ref 0–44)
AST: 31 U/L (ref 15–41)
Albumin: 4.2 g/dL (ref 3.5–5.0)
Alkaline Phosphatase: 28 U/L — ABNORMAL LOW (ref 38–126)
Anion gap: 9 (ref 5–15)
BUN: 16 mg/dL (ref 8–23)
CO2: 27 mmol/L (ref 22–32)
Calcium: 9.9 mg/dL (ref 8.9–10.3)
Chloride: 104 mmol/L (ref 98–111)
Creatinine: 0.97 mg/dL (ref 0.44–1.00)
GFR, Estimated: >60 mL/min (ref >60)
Glucose, Bld: 122 mg/dL — ABNORMAL HIGH (ref 70–99)
Potassium: 4.1 mmol/L (ref 3.5–5.1)
Sodium: 140 mmol/L (ref 135–145)
Total Bilirubin: 0.6 mg/dL (ref 0.3–1.2)
Total Protein: 7.4 g/dL (ref 6.5–8.1)

## 2022-08-29 LAB — URINALYSIS, COMPLETE (UACMP) WITH MICROSCOPIC
Bilirubin Urine: NEGATIVE
Glucose, UA: NEGATIVE mg/dL
Hgb urine dipstick: NEGATIVE
Ketones, ur: NEGATIVE mg/dL
Nitrite: NEGATIVE
Protein, ur: NEGATIVE mg/dL
Specific Gravity, Urine: 1.021 (ref 1.005–1.030)
pH: 5 (ref 5.0–8.0)

## 2022-08-29 NOTE — Assessment & Plan Note (Addendum)
Osteopenia. She had compression fractures of T10 and L2 and L3, February and March 2024, from severe falls. She had kyphoplasty of T10 in April at [facility].  She now is a cervical fusion for degenerative disease.  She is receiving Prolia for 6 months.  She is not taking any calcium or vitamin D.  She is due for Prolia again in 2 weeks.

## 2022-08-29 NOTE — Assessment & Plan Note (Signed)
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  She was treated with left mastectomy followed by adjuvant chemotherapy and radiation therapy.  She continues adjuvant endocrine therapy with anastrozole 1 mg daily with plans for at least 5 years of treatment.  She remains without evidence of recurrence.  Typically, I would go to 42-month follow-up at this time, however, this would not coincide with her Prolia every 6 months.  She continues to follow with Dr. Georgiana Shore as well.  We will plan to see her back in 6 months with a CBC and comprehensive metabolic panel prior to her next Prolia.

## 2022-08-29 NOTE — Assessment & Plan Note (Signed)
She reports a recent urinary tract infection treated with ciprofloxacin 500 mg twice daily for 10 days.  She is concerned that has not totally cleared up mainly due to incontinence, however, the incontinence is a chronic problem.  I will repeat a urinalysis and urine culture today.

## 2022-08-29 NOTE — Progress Notes (Addendum)
[facility]  [address] [place],  [place]  [address] [phone]  Clinic Day:  08/29/2022  Referring physician: Nonnie Done., MD  ASSESSMENT & PLAN:   Assessment & Plan: Osteopenia after menopause Osteopenia. She had compression fractures of T10 and L2 and L3, February and March 2024, from severe falls. She had kyphoplasty of T10 in April at [facility].  She now is a cervical fusion for degenerative disease.  She is receiving Prolia for 6 months.  She is not taking any calcium or vitamin D.  She is due for Prolia again in 2 weeks.  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  She was treated with left mastectomy followed by adjuvant chemotherapy and radiation therapy.  She continues adjuvant endocrine therapy with anastrozole 1 mg daily with plans for at least 5 years of treatment.  She remains without evidence of recurrence.  Typically, I would go to 70-month follow-up at this time, however, this would not coincide with her Prolia every 6 months.  She continues to follow with Dr. Georgiana Shore as well.  We will plan to see her back in 6 months with a CBC and comprehensive metabolic panel prior to her next Prolia.  Recurrent urinary tract infection She reports a recent urinary tract infection treated with ciprofloxacin 500 mg twice daily for 10 days.  She is concerned that has not totally cleared up mainly due to incontinence, however, the incontinence is a chronic problem.  I will repeat a urinalysis and urine culture today.    In regards to cervical fusion, there is no contraindication from oncology perspective. The patient understands the plans discussed today and is in agreement with them.  She knows to contact our office if she develops concerns prior to her next appointment.   I provided 30 minutes of  face-to-face time during this encounter and > 50% was spent counseling as documented under my assessment and plan.    Adah Perl, PA-C  [facility] [address] [place] [address] Dept: [phone] Dept Fax: [phone]   Orders Placed This Encounter  Procedures   Urine Culture    Standing Status:   Future    Number of Occurrences:   1    Standing Expiration Date:   08/29/2023   Urinalysis, Complete w Microscopic    Standing Status:   Future    Number of Occurrences:   1    Standing Expiration Date:   08/29/2023      CHIEF COMPLAINT:  CC: Stage IIA hormone receptor positive breast cancer/osteopenia with fracture  Current Treatment: Anastrozole 1 mg daily/denosumab every 6 months  HISTORY OF PRESENT ILLNESS:  Brandy Maxwell is a 67 year old with a history of stage IIA (T1b N1a 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 She was treated with left mastectomy.  One node was positive for malignancy. EndoPredict revealed EP clin score of 5.4, which corresponds with high risk for recurrence of 55% within the next 10 years.  The estimated chemotherapy benefit was as 27%.  She was treated with doxorubicin/cyclophosphamide for 4 cycles completed in May 2024.  She then received weekly paclitaxel for 8 of 12 planned cycles.  She had interruptions for various complications, including meningitis.  Chemotherapy  was discontinued in September 2023 due to complications.  She was placed on hormonal therapy with anastrozole in October 2023.  She continues to follow with Dr. Georgiana Shore.  Screening mammogram and September 2023 did not reveal any evidence of malignancy.   Oncology History  Malignant neoplasm of central portion of left breast in female, estrogen receptor positive (HCC)  09/16/2020 Mammogram   DIGITAL SCREENING BILATERAL MAMMOGRAM: In  the left breast, a possible mass warrants further evaluation. In the right breast, no findings suspicious for malignancy.    10/08/2020 Mammogram   DIAGNOSTIC UNILATERAL LEFT MAMMOGRAM AND LEFT BREAST ULTRASOUND: There is architectural distortion at the left breast 12 o'clock.  Targeted ultrasound is performed, showing no focal abnormal discrete cystic or solid lesion at left breast 12 o'clock the correlate to the mammographic finding. Ultrasound of the left axilla is negative.   10/20/2020 Pathology Results   Breast, left, needle core biopsy, 12 o'clock:             Invasive mammary carcinoma             Mammary carcinoma in situ HER2: Negative (0) ER: Positive 30% PR: Negative 0% Ki67: 1%   10/26/2020 Initial Diagnosis   Malignant neoplasm of central portion of left breast in female, estrogen receptor positive (HCC)   12/17/2020 Cancer Staging   Staging form: Breast, AJCC 8th Edition - Clinical stage from 12/17/2020: Stage IIA (cT1b, cN1(sn), cM0, G2, ER+, PR-, HER2-) - Signed by Dellia Beckwith, MD on 02/13/2021 Histopathologic type: Infiltrating duct carcinoma, NOS Stage prefix: Initial diagnosis Method of lymph node assessment: Sentinel lymph node biopsy Nuclear grade: G2 Multigene prognostic tests performed: EndoPredict Histologic grading system: 3 grade system Laterality: Left Tumor size (mm): 7 Lymph-vascular invasion (LVI): LVI not present (absent)/not identified Diagnostic confirmation: Positive histology Specimen type: Excision Staged by: Managing physician Menopausal status: Postmenopausal Ki-67 (%): 1 EndoPredict EPclin risk score: 5.4 EndoPredict EPclin risk level: High risk EndoPredict 10-year percentage risk of distant recurrence (%): 55 EndoPredict 10-year risk of distant recurrence: High risk Stage used in treatment planning: Yes National guidelines used in treatment planning: Yes Type of national guideline used in treatment planning: NCCN Staging  comments: Endopredict with 27% benefit from chemotherapy   12/17/2020 Pathology Results   Breast, simple mastectomy, left:         Invasive ductal carcinoma, grade 2, 7 mm, and ductal carcinoma in situ         Ribbon clip and biopsy reaction present         All margins negative for invasive carcinoma and DCIS         Fibrocystic changes Lymph node, sentinel, biopsy, left axillary:         Tumor present in regional lymph node (1/1)   02/18/2021 - 08/30/2021 Chemotherapy   Patient is on Treatment Plan : Breast AC q21 Days / PACLitaxel q7d      Genetic Testing   Negative genetic testing. No pathogenic variants identified on the [facility] Multi-Cancer+RNA panel. The report date is 03/08/2021.  The Multi-Cancer Panel + RNA offered by [facility] includes sequencing and/or deletion duplication testing of the following 84 genes: AIP, ALK, APC, ATM, AXIN2,BAP1,  BARD1, BLM, BMPR1A, BRCA1, BRCA2, BRIP1, CASR, CDC73, CDH1, CDK4, CDKN1B, CDKN1C, CDKN2A (p14ARF), CDKN2A (p16INK4a), CEBPA, CHEK2, CTNNA1, DICER1, DIS3L2, EGFR (c.2369C>T, p.Thr790Met variant only), EPCAM (Deletion/duplication testing only), FH, FLCN, GATA2, GPC3, GREM1 (Promoter region deletion/duplication testing only), HOXB13 (c.251G>A, p.Gly84Glu), HRAS, KIT, MAX, MEN1, MET, MITF (c.952G>A, p.Glu318Lys variant only),  MLH1, MSH2, MSH3, MSH6, MUTYH, NBN, NF1, NF2, NTHL1, PALB2, PDGFRA, PHOX2B, PMS2, POLD1, POLE, POT1, PRKAR1A, PTCH1, PTEN, RAD50, RAD51C, RAD51D, RB1, RECQL4, RET, RUNX1, SDHAF2, SDHA (sequence changes only), SDHB, SDHC, SDHD, SMAD4, SMARCA4, SMARCB1, SMARCE1, STK11, SUFU, TERC, TERT, TMEM127, TP53, TSC1, TSC2, VHL, WRN and WT1.   02/18/2021 - 09/07/2021 Chemotherapy   Patient is on Treatment Plan : Breast AC q21 Days / PACLitaxel q7d         INTERVAL HISTORY:  Brandy Maxwell is here today for repeat clinical assessment prior to Prolia.  She states she continues anastrozole 1 mg daily without significant difficulty.  She reports hot flashes,  increased irritability and clear vaginal discharge.  She denies any changes in her right breast or left mastectomy site.  She states she had a recent severe bladder infection for which Dr. Laymond Purser gave her ciprofloxacin 500 mg twice daily for 2 days.  When she was diagnosed, she states she had fever up to 101.7, her urine was red and she had increased incontinence, as well as redness in the groin and under the pannus.  Her urine is now clear and skin redness resolved, but she does not feel well and her incontinence is worsening.  However, she gets Botox for urinary incontinence and is scheduled for this next week.  She reports persistent dizziness, for which she is on midodrine 3 times a day.  She has only had 1 dose today.  She reports occasional nausea but denies vomiting, diarrhea, constipation or abdominal pain.   She states a temperature of 98.2 is high for her.  She states she wants to have cervical fusion for degenerative disease and the surgeons office plan to contact us.  She neck and right arm pain, for which she is on tramadol. Her appetite is good. Her weight has decreased 2 pounds over last 3 months .  She continues to follow with Dr. Georgiana Shore and will see him in September with a right screening mammogram.  REVIEW OF SYSTEMS:  Review of Systems  Constitutional:  Positive for fatigue. Negative for appetite change, chills, fever and unexpected weight change.  HENT:   Negative for lump/mass, mouth sores and sore throat.   Respiratory:  Negative for cough and shortness of breath.   Cardiovascular:  Negative for chest pain and leg swelling.  Gastrointestinal:  Positive for nausea (Occasional). Negative for abdominal pain, constipation, diarrhea and vomiting.  Endocrine: Negative for hot flashes.  Genitourinary:  Negative for difficulty urinating, dysuria, frequency and hematuria.   Musculoskeletal:  Negative for arthralgias, back pain and myalgias.  Skin:  Negative for rash.  Neurological:   Positive for numbness (Right arm). Negative for dizziness (Chronic, stable), headaches, seizures and speech difficulty.  Hematological:  Negative for adenopathy. Does not bruise/bleed easily.  Psychiatric/Behavioral:  Negative for depression and sleep disturbance. The patient is not nervous/anxious.      VITALS:  Blood pressure (!) 84/61, pulse 92, temperature 98.2 F (36.8 C), temperature source Oral, resp. rate 20, height 5\' 5"  (1.651 m), weight 184 lb 12.8 oz (83.8 kg), last menstrual period 09/02/1998, SpO2 98%.  Wt Readings from Last 3 Encounters:  08/29/22 184 lb 12.8 oz (83.8 kg)  06/01/22 186 lb 8 oz (84.6 kg)  04/10/22 184 lb 9.6 oz (83.7 kg)    Body mass index is 30.75 kg/m.  Performance status (ECOG): 2 - Symptomatic, <50% confined to bed  PHYSICAL EXAM:  Physical Exam Vitals and nursing note reviewed.  Constitutional:      General:  She is not in acute distress.    Appearance: Normal appearance.  HENT:     Head: Normocephalic and atraumatic.     Mouth/Throat:     Mouth: Mucous membranes are moist.     Pharynx: Oropharynx is clear. No oropharyngeal exudate or posterior oropharyngeal erythema.  Eyes:     General: No scleral icterus.    Extraocular Movements: Extraocular movements intact.     Conjunctiva/sclera: Conjunctivae normal.     Pupils: Pupils are equal, round, and reactive to light.  Cardiovascular:     Rate and Rhythm: Normal rate and regular rhythm.     Heart sounds: Normal heart sounds. No murmur heard.    No friction rub. No gallop.  Pulmonary:     Effort: Pulmonary effort is normal.     Breath sounds: Normal breath sounds. No wheezing, rhonchi or rales.  Chest:  Breasts:    Right: Normal. No inverted nipple, mass, nipple discharge or skin change.     Left: Absent.     Comments: Left mastectomy site is negative Abdominal:     General: There is no distension.     Palpations: Abdomen is soft. There is no hepatomegaly, splenomegaly or mass.      Tenderness: There is abdominal tenderness in the right upper quadrant and epigastric area.  Musculoskeletal:        General: Normal range of motion.     Cervical back: Normal range of motion and neck supple. No tenderness.     Right lower leg: No edema.     Left lower leg: No edema.  Lymphadenopathy:     Cervical: No cervical adenopathy.     Upper Body:     Right upper body: No supraclavicular or axillary adenopathy.     Left upper body: No supraclavicular or axillary adenopathy.     Lower Body: No right inguinal adenopathy. No left inguinal adenopathy.  Skin:    General: Skin is warm and dry.     Coloration: Skin is not jaundiced.     Findings: No erythema or rash.  Neurological:     Mental Status: She is alert and oriented to person, place, and time.     Cranial Nerves: No cranial nerve deficit.  Psychiatric:        Mood and Affect: Mood normal.        Behavior: Behavior normal.        Thought Content: Thought content normal.     LABS:      Latest Ref Rng & Units 08/29/2022    2:23 PM 06/01/2022    1:42 PM 03/09/2022    2:17 PM  CBC  WBC 4.0 - 10.5 K/uL 4.8  6.9  10.5   Hemoglobin 12.0 - 15.0 g/dL 88.4  16.6  06.3   Hematocrit 36.0 - 46.0 % 41.7  41.8  42.9   Platelets 150 - 400 K/uL 287  303  283       Latest Ref Rng & Units 08/29/2022    2:23 PM 06/01/2022    1:42 PM 03/09/2022    2:17 PM  CMP  Glucose 70 - 99 mg/dL 016  010  932   BUN 8 - 23 mg/dL 16  14  15    Creatinine 0.44 - 1.00 mg/dL 3.55  7.32  2.02   Sodium 135 - 145 mmol/L 140  137  138   Potassium 3.5 - 5.1 mmol/L 4.1  3.9  4.2   Chloride 98 - 111 mmol/L 104  102  105   CO2 22 - 32 mmol/L 27  25  24    Calcium 8.9 - 10.3 mg/dL 9.9  9.8  9.3   Total Protein 6.5 - 8.1 g/dL 7.4  7.4  7.3   Total Bilirubin 0.3 - 1.2 mg/dL 0.6  0.4  0.7   Alkaline Phos 38 - 126 U/L 28  38  47   AST 15 - 41 U/L 31  28  29    ALT 0 - 44 U/L 28  29  31       No results found for: "CEA1", "CEA" / No results found for: "CEA1",  "CEA" No results found for: "PSA1" No results found for: "AVW098" No results found for: "CAN125"  No results found for: "TOTALPROTELP", "ALBUMINELP", "A1GS", "A2GS", "BETS", "BETA2SER", "GAMS", "MSPIKE", "SPEI" No results found for: "TIBC", "FERRITIN", "IRONPCTSAT" No results found for: "LDH"  STUDIES:  No results found.    HISTORY:   Past Medical History:  Diagnosis Date   Abnormal mammogram of left breast 11/05/2018   Allergic rhinitis 08/30/2013   Altered consciousness 09/29/2020   Arthritis    Atypical ductal hyperplasia of right breast 05/21/2017   Cardiac murmur 09/23/2019   Chronic kidney disease    recurrent kidney infections   Closed wedge compression fracture of T10 vertebra (HCC) 03/23/2022   Coccydynia 03/24/2015   Degeneration of thoracic or thoracolumbar intervertebral disc 02/12/2013   Depression with anxiety 08/30/2013   Diet-controlled diabetes mellitus (HCC) 10/31/2019   Difficulty in walking 08/30/2013   Disorder of sacrum 02/12/2013   Dyspnea on exertion 10/29/2020   Epilepsy (HCC) 08/30/2013   Excessive daytime sleepiness 07/03/2017   Family history of breast cancer    Family history of colon cancer    Family history of first degree relative with dementia 10/26/2017   Family history of lung cancer    Family history of prostate cancer    Family history of uterine cancer    Fibromyalgia    Gait abnormality 07/10/2012   Genetic testing 03/09/2021   Negative genetic testing. No pathogenic variants identified on the [facility] Multi-Cancer+RNA panel. The report date is 03/08/2021.  The Multi-Cancer Panel + RNA offered by [facility] includes sequencing and/or deletion duplication testing of the following 84 genes: AIP, ALK, APC, ATM, AXIN2,BAP1,  BARD1, BLM, BMPR1A, BRCA1, BRCA2, BRIP1, CASR, CDC73, CDH1, CDK4, CDKN1B, CDKN1C, CDKN2A (p14ARF), CDKN2A (   Herpes zoster with nervous system complications 02/12/2013   History of seizure disorder 03/17/2019    Hypercholesteremia    Hyperlipidemia 08/30/2013   IBS (irritable bowel syndrome)    Insomnia 09/07/2015   Malignant neoplasm of central portion of left breast in female, estrogen receptor positive (HCC) 10/26/2020   Mass of upper outer quadrant of left breast 05/21/2017   Medication management 01/31/2019   Migraine headache 08/30/2013   Mild cognitive impairment 03/17/2019   Muscle weakness (generalized) 07/25/2021   Myalgia and myositis 02/12/2013   Neurogenic bladder 05/21/2020   Neuropathic pain 07/22/2020   Neuropathy 03/17/2019   Obesity, Class I, BMI 30-34.9 03/17/2019   Obstructive sleep apnea (adult) (pediatric) 08/30/2013   Osteopenia after menopause 02/10/2021   Pain in thoracic spine 02/12/2013   Palpitations 09/23/2019   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   Postlaminectomy syndrome, lumbar region 02/12/2013   Postoperative examination 06/19/2017   Postural hypotension 09/23/2019   Recurrent urinary tract infection 08/29/2021   Restless legs syndrome 05/22/2017   Risk for falls 09/07/2015   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  Seizures (HCC)    Sleep apnea    nightly cpap   Spondylosis of thoracic region without myelopathy or radiculopathy 02/12/2013   Thoracic or lumbosacral neuritis or radiculitis 02/12/2013   Thoracic spondylosis with myelopathy 02/12/2013   Tremors of nervous system    Urinary incontinence 03/17/2019   Urinary incontinence, mixed 05/29/2013   Vitamin B 12 deficiency 03/17/2019   Vitamin D deficiency 03/17/2019   Weakness 07/10/2012    Past Surgical History:  Procedure Laterality Date   BLADDER REPAIR  03/13/2018   BLADDER SUSPENSION  03/13/2018   BREAST LUMPECTOMY  2019   COLONOSCOPY  12/20/2012   Left colonic diverticulosis predominantly in the sigmoid colon. Small internal hemmorhoids. Otherwise normal colonoscopy.    LAMINECTOMY  1998    Family History  Problem Relation Age of Onset    Hypercholesterolemia Mother    Alzheimer's disease Mother    Heart Problems Father    Clotting disorder Father    Alzheimer's disease Father    Prostate cancer Father        dx 41s   Allergic rhinitis Sister    Colon cancer Maternal Grandfather        73s   Brain cancer Paternal Grandfather        64s   Uterine cancer Maternal Aunt        dx 76s, d. 47   Breast cancer Maternal Aunt 60       recurrence, metastatic, d. 30   Hemophilia Maternal Aunt    Breast cancer Maternal Aunt        dx 4-s d. 64s   Lung cancer Maternal Uncle 50       smoker   Lung cancer Maternal Uncle        85s   Breast cancer Paternal Aunt 69       dx 83s d. 68, 3 recurrences   Liver cancer Paternal Uncle    Kidney cancer Paternal Uncle    Colon cancer Cousin    Breast cancer Cousin        dx 3s, bilateral mastectomy   Esophageal cancer Neg Hx    Stomach cancer Neg Hx    Rectal cancer Neg Hx     Social History:  reports that she has never smoked. She has never used smokeless tobacco. She reports that she does not drink alcohol and does not use drugs.The patient is accompanied by her husband today.  Allergies:  Allergies  Allergen Reactions   Nitrofurantoin Anaphylaxis, Dermatitis, Diarrhea, Other (See Comments), Hives, Itching, Nausea Only, Palpitations, Photosensitivity, Rash, Shortness Of Breath, Swelling, Tinitus and Nausea And Vomiting    Other Reaction(s): Fatique  Other Reaction(s): Abdominal Pain, Confusion, Confusion (intolerance), Dermatitis, Dermatitis, Lethargy (intolerance), Other, Seizures   Sulfa Antibiotics Hives, Other (See Comments) and Rash    Other reaction(s): Rash  Other Reaction(s): Other (See Comments)    Other reaction(s): Rash    Current Medications: Current Outpatient Medications  Medication Sig Dispense Refill   midodrine (PROAMATINE) 5 MG tablet Take 5 mg by mouth 3 (three) times daily.     traMADol (ULTRAM) 50 MG tablet Take 50 mg by mouth every 6 (six) hours  as needed.     anastrozole (ARIMIDEX) 1 MG tablet TAKE 1 TABLET BY MOUTH EVERY DAY 90 tablet 1   calcitonin, salmon, (MIACALCIN/FORTICAL) 200 UNIT/ACT nasal spray Place 1 spray into alternate nostrils daily. (Patient not taking: Reported on 08/29/2022)     chlorhexidine (PERIDEX) 0.12 % solution Use as directed  5 mLs in the mouth or throat as directed.     cyclobenzaprine (FLEXERIL) 10 MG tablet Take 10 mg by mouth 3 (three) times daily as needed.     denosumab (XGEVA) 120 MG/1.7ML SOLN injection Inject 120 mg into the skin every 6 (six) months.     dicyclomine (BENTYL) 10 MG capsule Take 10 mg by mouth 3 (three) times daily as needed for pain. With IBS     EPINEPHrine 0.3 mg/0.3 mL IJ SOAJ injection Inject 0.3 mg into the muscle as needed for anaphylaxis.     ezetimibe (ZETIA) 10 MG tablet Take 10 mg by mouth daily.     fenofibrate 160 MG tablet Take 160 mg by mouth daily.     gabapentin (NEURONTIN) 600 MG tablet Take 1,200 mg by mouth 3 (three) times daily.     HYDROcodone-acetaminophen (NORCO/VICODIN) 5-325 MG tablet Take 1 tablet by mouth in the morning and at bedtime.     levETIRAcetam (KEPPRA) 750 MG tablet Take 1.5 tablets by mouth 2 (two) times daily.     lidocaine (LIDODERM) 5 % Place 1 patch onto the skin daily.     linaclotide (LINZESS) 145 MCG CAPS capsule Take 145 mcg by mouth as needed (severe constipation).     linaclotide (LINZESS) 72 MCG capsule Take 1 capsule (72 mcg total) by mouth daily before breakfast. 90 capsule 4   LINZESS 72 MCG capsule TAKE 1 CAPSULE BY MOUTH DAILY. 90 capsule 4   midodrine (PROAMATINE) 10 MG tablet Take 10 mg by mouth 3 (three) times daily.     mirabegron ER (MYRBETRIQ) 50 MG TB24 tablet Take 1 tablet by mouth daily.     omeprazole (PRILOSEC OTC) 20 MG tablet Take 1 tablet by mouth daily.     ondansetron (ZOFRAN) 8 MG tablet TAKE 1 TABLET (8 MG TOTAL) BY MOUTH 2 (TWO) TIMES DAILY AS NEEDED. START ON THE THIRD DAY AFTER CHEMOTHERAPY. 30 tablet 1    pramipexole (MIRAPEX) 1 MG tablet Take 1 mg by mouth at bedtime. Takes 1/2 tab     ramelteon (ROZEREM) 8 MG tablet 8 mg. (Patient not taking: Reported on 08/29/2022)     traZODone (DESYREL) 50 MG tablet Take 50 mg by mouth at bedtime.     valACYclovir (VALTREX) 1000 MG tablet Take 1,000 mg by mouth daily.     Vitamin D, Ergocalciferol, (DRISDOL) 1.25 MG (50000 UT) CAPS capsule TAKE 1 CAPSULE BY MOUTH ONCE A WEEK FOR 30 DAYS     vitamin E 1000 UNIT capsule Take 1,000 Units by mouth daily.     zinc gluconate 50 MG tablet Take 50 mg by mouth daily.     No current facility-administered medications for this visit.

## 2022-08-30 ENCOUNTER — Encounter: Payer: Self-pay | Admitting: Hematology and Oncology

## 2022-08-30 LAB — URINE CULTURE: Culture: NO GROWTH

## 2022-08-31 ENCOUNTER — Telehealth: Payer: Self-pay

## 2022-08-31 NOTE — Telephone Encounter (Signed)
-----   Message from Adah Perl sent at 08/30/2022  6:23 PM EDT ----- Please let her know her urine culture was negative and fax to Dr. Laymond Purser. Thanks

## 2022-08-31 NOTE — Telephone Encounter (Signed)
Detailed message left for patient, report faxed to PCP.

## 2022-09-01 ENCOUNTER — Encounter: Payer: Self-pay | Admitting: Hematology and Oncology

## 2022-09-06 ENCOUNTER — Other Ambulatory Visit: Payer: Self-pay | Admitting: Cardiology

## 2022-09-06 ENCOUNTER — Other Ambulatory Visit: Payer: Self-pay | Admitting: Oncology

## 2022-09-07 ENCOUNTER — Other Ambulatory Visit: Payer: Self-pay | Admitting: Oncology

## 2022-09-07 DIAGNOSIS — Z006 Encounter for examination for normal comparison and control in clinical research program: Secondary | ICD-10-CM

## 2022-09-13 ENCOUNTER — Inpatient Hospital Stay: Payer: Medicare Other | Attending: Hematology and Oncology

## 2022-09-13 ENCOUNTER — Encounter: Payer: Self-pay | Admitting: Hematology and Oncology

## 2022-09-13 ENCOUNTER — Telehealth: Payer: Self-pay

## 2022-09-13 NOTE — Telephone Encounter (Signed)
1340 PATIENT CALLED TO CANCEL TODAY'S APPT AND R/S FOR 9/25 AT 1530 FOR THE PROLIA SHOT.

## 2022-09-13 NOTE — Addendum Note (Signed)
Addended by: Domenic Schwab on: 09/13/2022 08:38 AM   Modules accepted: Orders

## 2022-09-25 LAB — HM MAMMOGRAPHY

## 2022-09-26 ENCOUNTER — Other Ambulatory Visit: Payer: Self-pay | Admitting: Pharmacist

## 2022-09-26 ENCOUNTER — Encounter: Payer: Self-pay | Admitting: Hematology and Oncology

## 2022-09-26 NOTE — Progress Notes (Signed)
Ok to proceed with prolia on 9/25 using labs from 8/27 per [facility].

## 2022-09-26 NOTE — Addendum Note (Signed)
Addended by: Domenic Schwab on: 09/26/2022 03:02 PM   Modules accepted: Orders

## 2022-09-27 ENCOUNTER — Inpatient Hospital Stay: Payer: Medicare Other | Attending: Hematology and Oncology

## 2022-09-27 DIAGNOSIS — Z780 Asymptomatic menopausal state: Secondary | ICD-10-CM | POA: Insufficient documentation

## 2022-09-27 DIAGNOSIS — Z170 Estrogen receptor positive status [ER+]: Secondary | ICD-10-CM

## 2022-09-27 DIAGNOSIS — M8580 Other specified disorders of bone density and structure, unspecified site: Secondary | ICD-10-CM | POA: Diagnosis present

## 2022-09-27 MED ORDER — DENOSUMAB 60 MG/ML ~~LOC~~ SOSY
60.0000 mg | PREFILLED_SYRINGE | Freq: Once | SUBCUTANEOUS | Status: AC
  Administered 2022-09-27: 60 mg via SUBCUTANEOUS
  Filled 2022-09-27: qty 1

## 2022-09-27 MED ORDER — SODIUM CHLORIDE 0.9 % IV SOLN: Freq: Once | INTRAVENOUS | Status: DC

## 2022-09-27 NOTE — Patient Instructions (Signed)
Denosumab Injection (Osteoporosis) What is this medication? DENOSUMAB (den oh SUE mab) prevents and treats osteoporosis. It works by Interior and spatial designer stronger and less likely to break (fracture). It is a monoclonal antibody. This medicine may be used for other purposes; ask your health care provider or pharmacist if you have questions. COMMON BRAND NAME(S): Prolia What should I tell my care team before I take this medication? They need to know if you have any of these conditions: Dental or gum disease Had thyroid or parathyroid (glands located in neck) surgery Having dental surgery or a tooth pulled Kidney disease Low levels of calcium in the blood On dialysis Poor nutrition Thyroid disease Trouble absorbing nutrients from your food An unusual or allergic reaction to denosumab, other medications, foods, dyes, or preservatives Pregnant or trying to get pregnant Breastfeeding How should I use this medication? This medication is injected under the skin. It is given by your care team in a hospital or clinic setting. A special MedGuide will be given to you before each treatment. Be sure to read this information carefully each time. Talk to your care team about the use of this medication in children. Special care may be needed. Overdosage: If you think you have taken too much of this medicine contact a poison control center or emergency room at once. NOTE: This medicine is only for you. Do not share this medicine with others. What if I miss a dose? Keep appointments for follow-up doses. It is important not to miss your dose. Call your care team if you are unable to keep an appointment. What may interact with this medication? Do not take this medication with any of the following: Other medications that contain denosumab This medication may also interact with the following: Medications that lower your chance of fighting infection Steroid medications, such as prednisone or cortisone This  list may not describe all possible interactions. Give your health care provider a list of all the medicines, herbs, non-prescription drugs, or dietary supplements you use. Also tell them if you smoke, drink alcohol, or use illegal drugs. Some items may interact with your medicine. What should I watch for while using this medication? Your condition will be monitored carefully while you are receiving this medication. You may need blood work done while taking this medication. This medication may increase your risk of getting an infection. Call your care team for advice if you get a fever, chills, sore throat, or other symptoms of a cold or flu. Do not treat yourself. Try to avoid being around people who are sick. Tell your dentist and dental surgeon that you are taking this medication. You should not have major dental surgery while on this medication. See your dentist to have a dental exam and fix any dental problems before starting this medication. Take good care of your teeth while on this medication. Make sure you see your dentist for regular follow-up appointments. This medication may cause low levels of calcium in your body. The risk of severe side effects is increased in people with kidney disease. Your care team may prescribe calcium and vitamin D to help prevent low calcium levels while you take this medication. It is important to take calcium and vitamin D as directed by your care team. Talk to your care team if you may be pregnant. Serious birth defects may occur if you take this medication during pregnancy and for 5 months after the last dose. You will need a negative pregnancy test before starting this medication. Contraception  is recommended while taking this medication and for 5 months after the last dose. Your care team can help you find the option that works for you. Talk to your care team before breastfeeding. Changes to your treatment plan may be needed. What side effects may I notice from  receiving this medication? Side effects that you should report to your care team as soon as possible: Allergic reactions--skin rash, itching, hives, swelling of the face, lips, tongue, or throat Infection--fever, chills, cough, sore throat, wounds that don't heal, pain or trouble when passing urine, general feeling of discomfort or being unwell Low calcium level--muscle pain or cramps, confusion, tingling, or numbness in the hands or feet Osteonecrosis of the jaw--pain, swelling, or redness in the mouth, numbness of the jaw, poor healing after dental work, unusual discharge from the mouth, visible bones in the mouth Severe bone, joint, or muscle pain Skin infection--skin redness, swelling, warmth, or pain Side effects that usually do not require medical attention (report these to your care team if they continue or are bothersome): Back pain Headache Joint pain Muscle pain Pain in the hands, arms, legs, or feet Runny or stuffy nose Sore throat This list may not describe all possible side effects. Call your doctor for medical advice about side effects. You may report side effects to FDA at 1-800-FDA-1088. Where should I keep my medication? This medication is given in a hospital or clinic. It will not be stored at home. NOTE: This sheet is a summary. It may not cover all possible information. If you have questions about this medicine, talk to your doctor, pharmacist, or health care provider.  2024 Elsevier/Gold Standard (2022-01-24 00:00:00)

## 2022-10-02 DIAGNOSIS — Z9012 Acquired absence of left breast and nipple: Secondary | ICD-10-CM | POA: Insufficient documentation

## 2022-10-02 HISTORY — DX: Acquired absence of left breast and nipple: Z90.12

## 2022-10-21 ENCOUNTER — Other Ambulatory Visit
Admission: RE | Admit: 2022-10-21 | Discharge: 2022-10-21 | Disposition: A | Discharge status: Home / Self Care | Payer: Medicare Other | Source: Ambulatory Visit | Attending: Oncology | Admitting: Oncology

## 2022-10-21 DIAGNOSIS — Z006 Encounter for examination for normal comparison and control in clinical research program: Secondary | ICD-10-CM | POA: Insufficient documentation

## 2022-10-31 LAB — HELIX MOLECULAR SCREEN: Genetic Analysis Overall Interpretation: NEGATIVE

## 2022-12-03 HISTORY — PX: CERVICAL FUSION: SHX112

## 2022-12-08 ENCOUNTER — Telehealth: Payer: Self-pay | Admitting: Oncology

## 2022-12-08 ENCOUNTER — Encounter: Payer: Self-pay | Admitting: Hematology and Oncology

## 2022-12-08 NOTE — Telephone Encounter (Signed)
Pt wishes to wait on scheduling her next appts.  Scheduling Message Entered by Domenic Schwab on 12/08/2022 at 10:50 AM Priority: Routine <No visit type provided>  Department: [facility]  Provider:  Appointment Notes:  Pt needs more appts

## 2022-12-08 NOTE — Telephone Encounter (Signed)
Pt wishes to call back to schedule a follow up appt. She will have new insurance in 2025.

## 2022-12-21 ENCOUNTER — Other Ambulatory Visit: Payer: Self-pay | Admitting: Cardiology

## 2023-01-01 ENCOUNTER — Other Ambulatory Visit: Payer: Self-pay

## 2023-01-02 ENCOUNTER — Ambulatory Visit (INDEPENDENT_AMBULATORY_CARE_PROVIDER_SITE_OTHER): Payer: Medicare Other

## 2023-01-02 ENCOUNTER — Ambulatory Visit: Payer: Medicare Other | Admitting: Cardiology

## 2023-01-02 ENCOUNTER — Ambulatory Visit: Payer: Medicare Other | Attending: Cardiology | Admitting: Cardiology

## 2023-01-02 ENCOUNTER — Encounter: Payer: Self-pay | Admitting: Cardiology

## 2023-01-02 VITALS — BP 103/57 | HR 109 | Ht 65.0 in | Wt 185.4 lb

## 2023-01-02 DIAGNOSIS — R002 Palpitations: Secondary | ICD-10-CM | POA: Insufficient documentation

## 2023-01-02 DIAGNOSIS — I951 Orthostatic hypotension: Secondary | ICD-10-CM | POA: Insufficient documentation

## 2023-01-02 DIAGNOSIS — I700 Atherosclerosis of aorta: Secondary | ICD-10-CM | POA: Insufficient documentation

## 2023-01-02 DIAGNOSIS — E66811 Obesity, class 1: Secondary | ICD-10-CM | POA: Insufficient documentation

## 2023-01-02 NOTE — Progress Notes (Signed)
 Cardiology Office Note:    Date:  01/02/2023   ID:  Brandy Maxwell, DOB 06/01/1955, MRN [id number]  PCP:  Sabas Norleen PARAS., MD  Cardiologist:  Jennifer JONELLE Crape, MD   Referring MD: Sabas Norleen PARAS., MD    ASSESSMENT:    1. Aortic atherosclerosis (HCC)   2. Postural hypotension   3. Palpitations   4. Obesity (BMI 30.0-34.9)    PLAN:    In order of problems listed above:  Primary prevention stressed with the patient.  Importance of compliance with diet medication stressed and patient verbalized standing. Postural hypotension: She is taking clonidine and her blood pressure is fine and she has no issues with hypotension now.  She was taking a lower dose of this medication and she has bumped it up to 10 mg 3 enzymes a day which was her original dose.  She is now feeling much better. Palpitations and syncope will do a 2-week monitor to assess this. Obesity: Weight reduction stressed diet emphasized and risks of obesity explained she promises to do better. Aortic atherosclerosis: She is on lipid-lowering and diet was emphasized. Patient will be seen in follow-up appointment in 6 months or earlier if the patient has any concerns.    Medication Adjustments/Labs and Tests Ordered: Current medicines are reviewed at length with the patient today.  Concerns regarding medicines are outlined above.  No orders of the defined types were placed in this encounter.  No orders of the defined types were placed in this encounter.    No chief complaint on file.    History of Present Illness:    Brandy Maxwell is a 67 y.o. female.  Patient has past medical history of aortic atherosclerosis, history of palpitations.  She leads a sedentary lifestyle.  She has had neck surgery in the past and she is recovering from it.  She mentions to me that she passed out at another medical facility when she was getting an x-ray.  Subsequently she has not had any issues.  She is accompanied by her daughter.  At  the time of my evaluation, the patient is alert awake oriented and in no distress.  Past Medical History:  Diagnosis Date   Abnormal mammogram of left breast 11/05/2018   Allergic rhinitis 08/30/2013   Altered consciousness 09/29/2020   Aortic atherosclerosis (HCC) 04/10/2022   Arthritis    Atypical ductal hyperplasia of right breast 05/21/2017   Bronchitis due to COVID-19 virus 12/15/2021   Tested positive for COVID-19     Cardiac murmur 09/23/2019   Cervical radiculopathy 08/22/2022   Cervical spinal stenosis 07/31/2022   Chronic kidney disease    recurrent kidney infections   Closed wedge compression fracture of L2 vertebra (HCC) 02/14/2022   Closed wedge compression fracture of T10 vertebra (HCC) 03/23/2022   Coccydynia 03/24/2015   Degeneration of thoracic or thoracolumbar intervertebral disc 02/12/2013   Depression with anxiety 08/30/2013   Diet-controlled diabetes mellitus (HCC) 10/31/2019   Difficulty in walking 08/30/2013   Disorder of sacrum 02/12/2013   Dyspnea on exertion 10/29/2020   Epilepsy (HCC) 08/30/2013   Excessive daytime sleepiness 07/03/2017   Family history of breast cancer    Family history of colon cancer    Family history of first degree relative with dementia 10/26/2017   Family history of lung cancer    Family history of prostate cancer    Family history of uterine cancer    Fibromyalgia    Gait abnormality 07/10/2012   Genetic testing 03/09/2021  Negative genetic testing. No pathogenic variants identified on the [facility] Multi-Cancer+RNA panel. The report date is 03/08/2021.  The Multi-Cancer Panel + RNA offered by [facility] includes sequencing and/or deletion duplication testing of the following 84 genes: AIP, ALK, APC, ATM, AXIN2,BAP1,  BARD1, BLM, BMPR1A, BRCA1, BRCA2, BRIP1, CASR, CDC73, CDH1, CDK4, CDKN1B, CDKN1C, CDKN2A (p14ARF), CDKN2A (   Herniated cervical disc 07/31/2022   Herpes zoster with nervous system complications 02/12/2013   History  of left mastectomy 10/02/2022   History of seizure disorder 03/17/2019   Hypercholesteremia    Hyperlipidemia 08/30/2013   IBS (irritable bowel syndrome)    Insomnia 09/07/2015   Lumbar transverse process fracture (HCC) 02/14/2022   Malignant neoplasm of central portion of left breast in female, estrogen receptor positive (HCC) 10/26/2020   Mass of upper outer quadrant of left breast 05/21/2017   Medication management 01/31/2019   Migraine headache 08/30/2013   Mild cognitive impairment 03/17/2019   Muscle weakness (generalized) 07/25/2021   Myalgia and myositis 02/12/2013   Neurogenic bladder 05/21/2020   Neuropathic pain 07/22/2020   Neuropathy 03/17/2019   Obesity (BMI 30.0-34.9) 03/17/2019   Obesity, Class I, BMI 30-34.9 03/17/2019   Obstructive sleep apnea (adult) (pediatric) 08/30/2013   Osteopenia after menopause 02/10/2021   Pain in thoracic spine 02/12/2013   Palpitations 09/23/2019   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   Postlaminectomy syndrome, lumbar region 02/12/2013   Postoperative examination 06/19/2017   Postural hypotension 09/23/2019   Recurrent urinary tract infection 08/29/2021   Restless legs syndrome 05/22/2017   Risk for falls 09/07/2015   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   Seizures (HCC)    Sleep apnea    nightly cpap   Spondylosis of thoracic region without myelopathy or radiculopathy 02/12/2013   Spondylosis without myelopathy or radiculopathy, cervical region 07/31/2022   Syncope and collapse 03/04/2022   Thoracic or lumbosacral neuritis or radiculitis 02/12/2013   Thoracic spondylosis with myelopathy 02/12/2013   Tremors of nervous system    Urinary incontinence 03/17/2019   Urinary incontinence, mixed 05/29/2013   Vitamin B 12 deficiency 03/17/2019   Vitamin D deficiency 03/17/2019   Weakness 07/10/2012    Past Surgical History:  Procedure Laterality Date   BLADDER REPAIR  03/13/2018    BLADDER SUSPENSION  03/13/2018   BREAST LUMPECTOMY  2019   COLONOSCOPY  12/20/2012   Left colonic diverticulosis predominantly in the sigmoid colon. Small internal hemmorhoids. Otherwise normal colonoscopy.    LAMINECTOMY  1998    Current Medications: Current Meds  Medication Sig   anastrozole  (ARIMIDEX ) 1 MG tablet TAKE 1 TABLET BY MOUTH EVERY DAY   chlorhexidine (PERIDEX) 0.12 % solution Use as directed 5 mLs in the mouth or throat as directed.   cyclobenzaprine (FLEXERIL) 10 MG tablet Take 10 mg by mouth 3 (three) times daily as needed for muscle spasms.   denosumab  (XGEVA ) 120 MG/1.7ML SOLN injection Inject 120 mg into the skin every 6 (six) months.   dicyclomine (BENTYL) 10 MG capsule Take 10 mg by mouth 3 (three) times daily as needed for pain. With IBS   EPINEPHrine 0.3 mg/0.3 mL IJ SOAJ injection Inject 0.3 mg into the muscle as needed for anaphylaxis.   ezetimibe (ZETIA) 10 MG tablet Take 10 mg by mouth daily.   fenofibrate 160 MG tablet Take 160 mg by mouth daily.   gabapentin (NEURONTIN) 600 MG tablet Take 1,200 mg by mouth 3 (three) times daily.   levETIRAcetam (KEPPRA) 750 MG tablet Take 1.5 tablets  by mouth 2 (two) times daily.   lidocaine (LIDODERM) 5 % Place 1 patch onto the skin daily.   linaclotide  (LINZESS ) 145 MCG CAPS capsule Take 145 mcg by mouth as needed (severe constipation).   LINZESS  72 MCG capsule TAKE 1 CAPSULE BY MOUTH DAILY.   midodrine  (PROAMATINE ) 10 MG tablet Take 10 mg by mouth 3 (three) times daily.   mirabegron ER (MYRBETRIQ) 50 MG TB24 tablet Take 1 tablet by mouth daily.   omeprazole (PRILOSEC OTC) 20 MG tablet Take 1 tablet by mouth daily.   pramipexole (MIRAPEX) 1 MG tablet Take 1 mg by mouth at bedtime.   traMADol  (ULTRAM ) 50 MG tablet Take 50 mg by mouth every 6 (six) hours as needed for moderate pain (pain score 4-6) or severe pain (pain score 7-10).   traZODone (DESYREL) 50 MG tablet Take 50 mg by mouth at bedtime.   Vitamin D, Ergocalciferol,  (DRISDOL) 1.25 MG (50000 UT) CAPS capsule TAKE 1 CAPSULE BY MOUTH ONCE A WEEK FOR 30 DAYS   vitamin E 1000 UNIT capsule Take 1,000 Units by mouth daily.   zinc gluconate 50 MG tablet Take 50 mg by mouth daily.     Allergies:   Nitrofurantoin and Sulfa antibiotics   Social History   Socioeconomic History   Marital status: Married    Spouse name: Not on file   Number of children: 6   Years of education: Not on file   Highest education level: Not on file  Occupational History   Not on file  Tobacco Use   Smoking status: Never   Smokeless tobacco: Never  Vaping Use   Vaping status: Never Used  Substance and Sexual Activity   Alcohol use: Never   Drug use: Never   Sexual activity: Not on file  Other Topics Concern   Not on file  Social History Narrative   Not on file   Social Drivers of Health   Financial Resource Strain: Medium Risk (04/18/2022)   Received from [facility]   Overall Financial Resource Strain (CARDIA)    Difficulty of Paying Living Expenses: Somewhat hard  Food Insecurity: No Food Insecurity (12/13/2022)   Received from [facility]   Hunger Vital Sign    Worried About Running Out of Food in the Last Year: Never true    Ran Out of Food in the Last Year: Never true  Transportation Needs: Unmet Transportation Needs (12/13/2022)   Received from [facility] - Transportation    Lack of Transportation (Medical): Yes    Lack of Transportation (Non-Medical): No  Physical Activity: Insufficiently Active (04/18/2022)   Received from [facility]   Exercise Vital Sign    Days of Exercise per Week: 3 days    Minutes of Exercise per Session: 10 min  Stress: Stress Concern Present (12/13/2022)   Received from [facility] - Occupational Stress Questionnaire    Feeling of Stress : Very much  Social Connections: Moderately Integrated (04/18/2022)   Received from [facility]   Social Network    How would you rate your social network (family, work, friends)?: Adequate participation with social networks     Family History: The patient's family history includes Allergic rhinitis in her sister; Alzheimer's disease in her father and mother; Brain cancer in her paternal grandfather; Breast cancer in her cousin and maternal aunt; Breast cancer (age of onset: 52) in her paternal aunt; Breast  cancer (age of onset: 13) in her maternal aunt; Clotting disorder in her father; Colon cancer in her cousin and maternal grandfather; Heart Problems in her father; Hemophilia in her maternal aunt; Hypercholesterolemia in her mother; Kidney cancer in her paternal uncle; Liver cancer in her paternal uncle; Lung cancer in her maternal uncle; Lung cancer (age of onset: 79) in her maternal uncle; Prostate cancer in her father; Uterine cancer in her maternal aunt. There is no history of Esophageal cancer, Stomach cancer, or Rectal cancer.  ROS:   Please see the history of present illness.    All other systems reviewed and are negative.  EKGs/Labs/Other Studies Reviewed:    The following studies were reviewed today: I discussed my findings with the patient.   Recent Labs: 08/29/2022: ALT 28; BUN 16; Creatinine 0.97; Hemoglobin 13.3; Platelet Count 287; Potassium 4.1; Sodium 140  Recent Lipid Panel No results found for: CHOL, TRIG, HDL, CHOLHDL, VLDL, LDLCALC, LDLDIRECT  Physical Exam:    VS:  BP (!) 103/57 (BP Location: Right Arm, Patient Position: Sitting, Cuff Size: Normal)   Pulse (!) 109   Ht 5' 5 (1.651 m)   Wt 185 lb 6.4 oz (84.1 kg)   LMP 09/02/1998 (Approximate)   SpO2 96%   BMI 30.85 kg/m     Wt Readings from Last 3 Encounters:  01/02/23 185 lb 6.4 oz (84.1 kg)  08/29/22 184 lb 12.8 oz (83.8 kg)  06/01/22 186 lb 8 oz (84.6 kg)     GEN: Patient is in no acute distress HEENT: Normal NECK: No JVD; No carotid bruits LYMPHATICS: No  lymphadenopathy CARDIAC: Hear sounds regular, 2/6 systolic murmur at the apex. RESPIRATORY:  Clear to auscultation without rales, wheezing or rhonchi  ABDOMEN: Soft, non-tender, non-distended MUSCULOSKELETAL:  No edema; No deformity  SKIN: Warm and dry NEUROLOGIC:  Alert and oriented x 3 PSYCHIATRIC:  Normal affect   Signed, Jennifer JONELLE Crape, MD  01/02/2023 4:35 PM    [facility]

## 2023-01-02 NOTE — Patient Instructions (Signed)

## 2023-01-11 IMAGING — MR MR BREAST BILAT WO/W CM
8 of 12 series · 29 of 48 positions shown · IV contrast (9 GADAVIST)
Comparison: Prior mammograms and ultrasounds
COMPARISON: Prior mammograms and ultrasounds

Addendum:
CLINICAL DATA: 65-year-old female with newly diagnosed LEFT breast
cancer and DCIS.

EXAM:
BILATERAL BREAST MRI WITH AND WITHOUT CONTRAST
TECHNIQUE: Multiplanar, multisequence MR images of both breasts were obtained
prior to and following the intravenous administration of 9 ml of
Gadavist

[Series 2: t2_tirm_tra ipat (a-p) · axial · 3.0mm · 0.72mm/px · 1 of 51 slices shown]
[im 1/51]
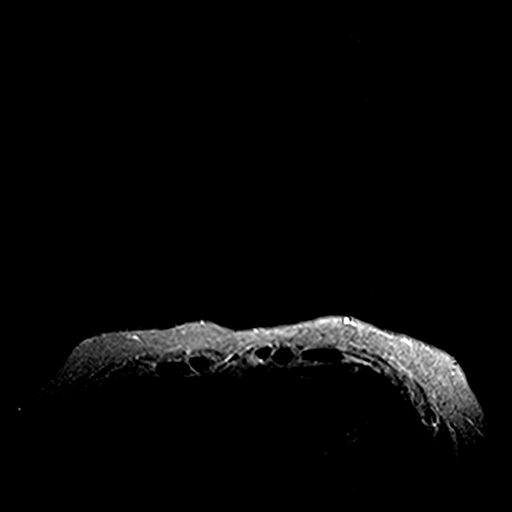

[Series 3: fl3d pre-cm no · axial · non-contrast · 1.2mm · 0.96mm/px · z∈[-62,+109]mm · 5 of 144 slices shown]
[im 1/144]
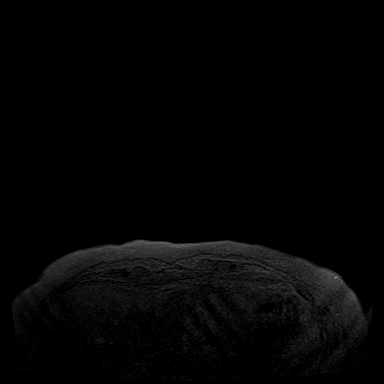
[im 36/144]
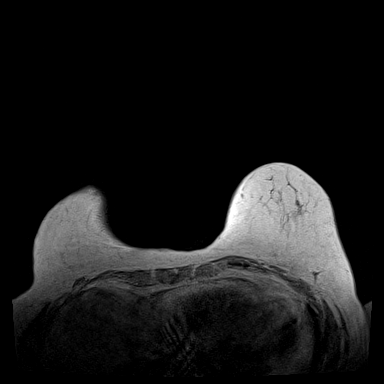
[im 72/144]
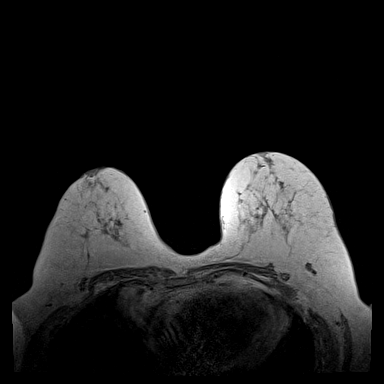
[im 108/144]
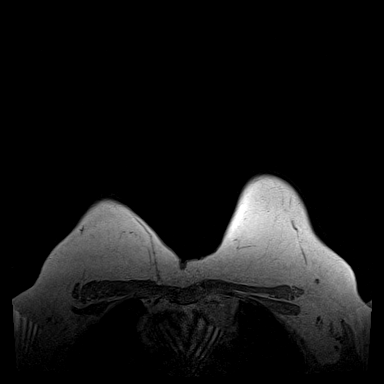
[im 144/144]
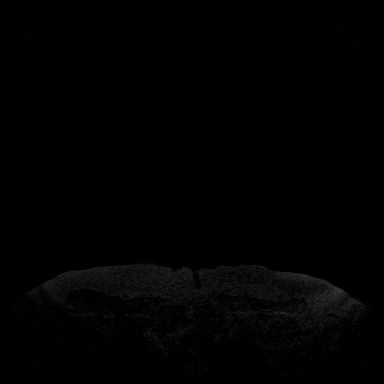

[Series 4: fl3d pre-cm · axial · non-contrast · 1.2mm · 0.96mm/px · z∈[-62,+109]mm · 5 of 144 slices shown]
[im 1/144]
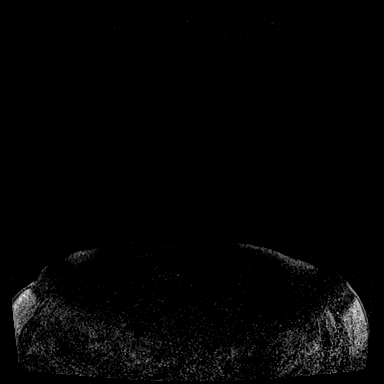
[im 36/144]
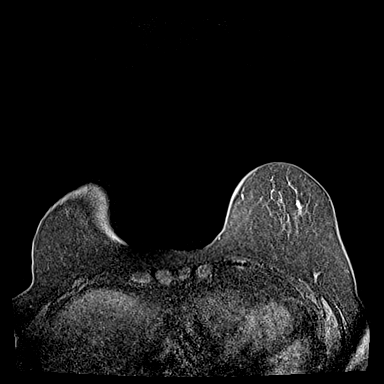
[im 72/144]
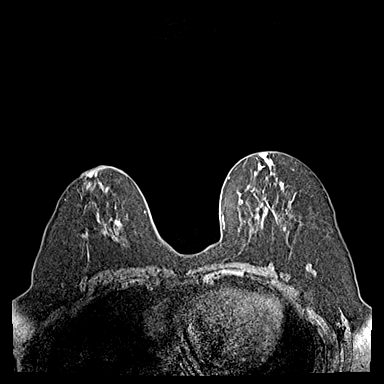
[im 108/144]
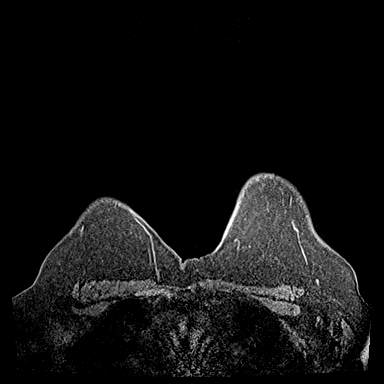
[im 144/144]
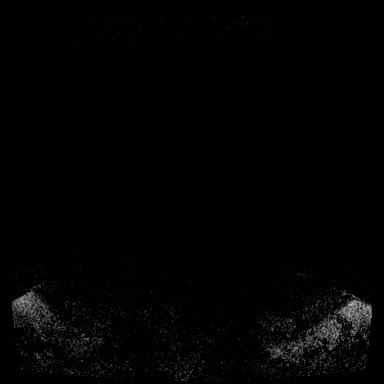

[Series 5: fl3d post-cm 20 · axial · 1.2mm · 0.96mm/px · z∈[-62,+109]mm · 5 of 144 slices shown (1 of 3)]
[im 1/144]
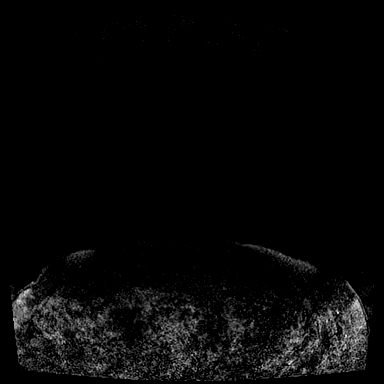
[im 36/144]
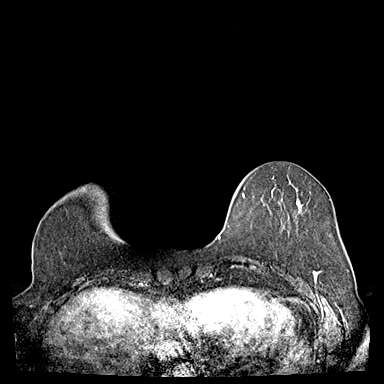
[im 72/144]
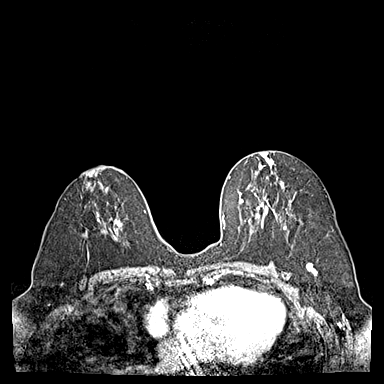
[im 108/144]
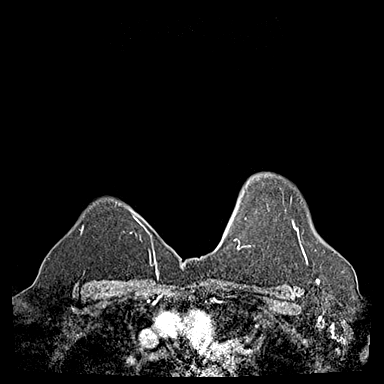
[im 144/144]
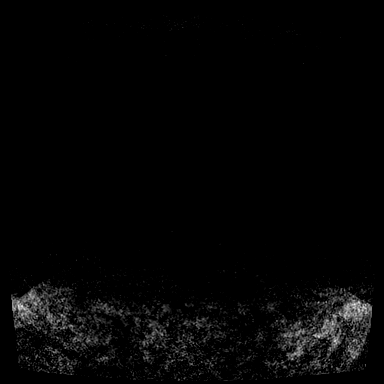

[Series 6: fl3d post-cm 20 · axial · 1.2mm · 0.96mm/px · z∈[-62,+109]mm · 5 of 144 slices shown (2 of 3)]
[im 1/144]
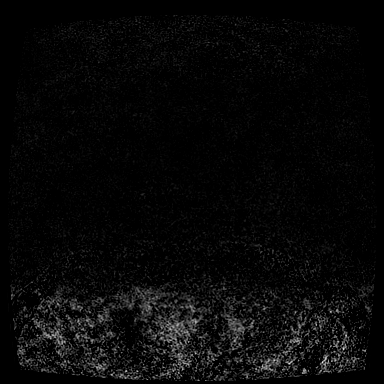
[im 36/144]
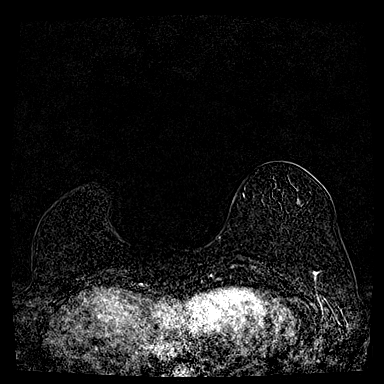
[im 72/144]
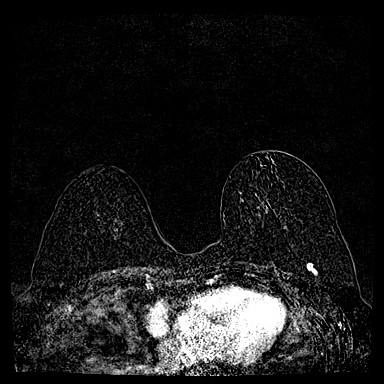
[im 108/144]
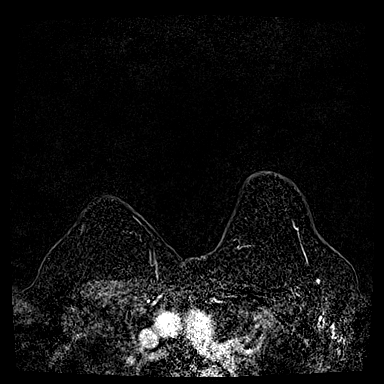
[im 144/144]
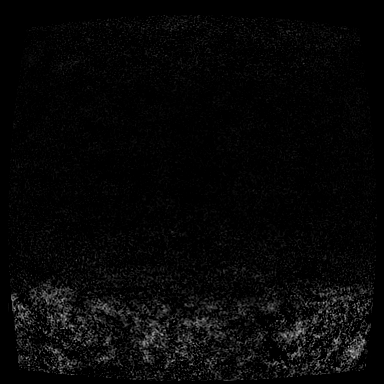

[Series 7: fl3d post-cm 20 · axial · 172.8mm · 0.96mm/px · 1 of 1 slices shown (3 of 3)]
[im 1/1]
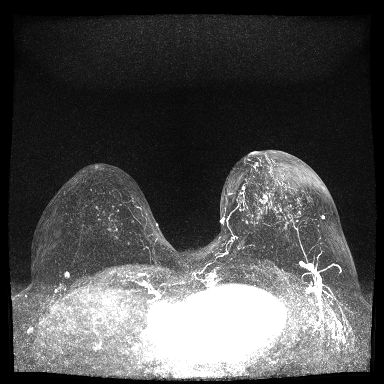

[Series 8: fl3d post-cm 3 · axial · 1.2mm · 0.96mm/px · z∈[-62,+109]mm · 6 of 144 slices shown (1 of 2)]
[im 1/144]
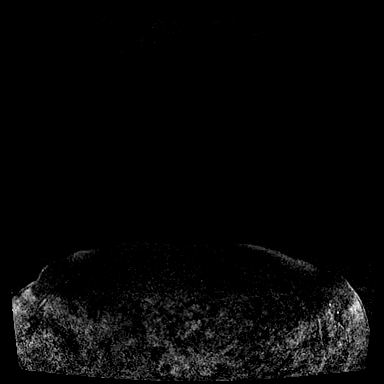
[im 29/144]
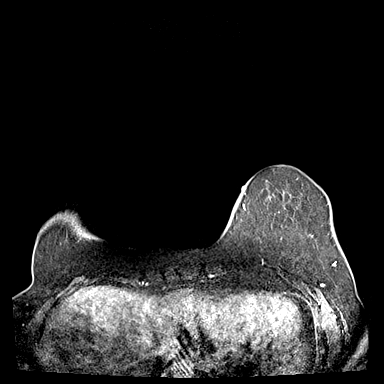
[im 58/144]
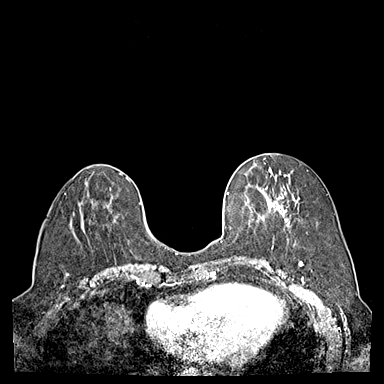
[im 86/144]
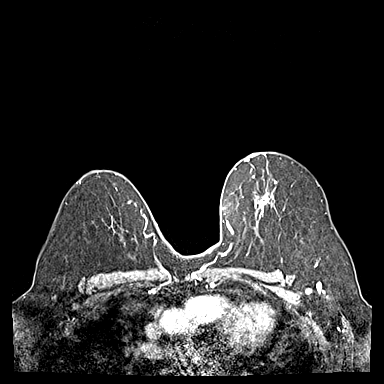
[im 115/144]
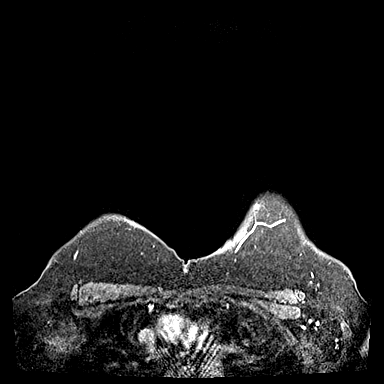
[im 144/144]
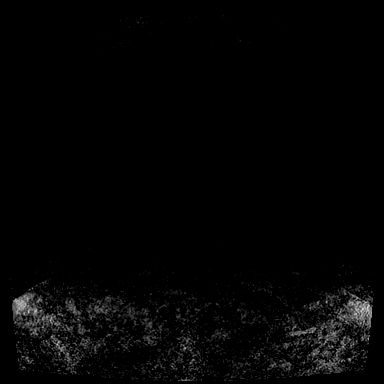

[Series 9: fl3d post-cm 3 · axial · 1.2mm · 0.96mm/px · 1 of 144 slices shown (2 of 2)]
[im 1/144]
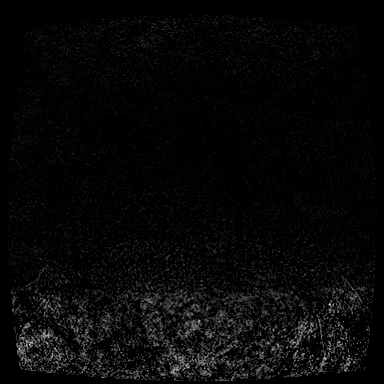

[29 of 48 positions shown; findings below may reference images not displayed]

Three-dimensional MR images were rendered by post-processing of the
original MR data on an independent workstation. The
three-dimensional MR images were interpreted, and findings are
reported in the following complete MRI report for this study. Three
dimensional images were evaluated at the independent interpreting
workstation using the DynaCAD thin client.
FINDINGS: Breast composition: b. Scattered fibroglandular tissue.

Background parenchymal enhancement: Mild

Right breast: No suspicious mass or enhancement. Scattered foci
within the central RIGHT breast are noted.

Left breast: A 1 cm irregular enhancing mass within the UPPER LEFT
breast, middle depth, is identified containing biopsy clip artifact
(image 61: Series 12).

Extending 3 cm inferiorly and posteriorly from this mass are linear
areas of enhancement which may represent post biopsy changes or
possible DCIS, best seen on coronal/sagittal reconstructions.

Two separate 1.2 cm areas of indeterminate linear enhancement are
noted within the slightly OUTER central LEFT breast (81-86:12).
These 2 areas of linear enhancement are separated by a distance of
0.5 cm.

Lymph nodes: No abnormal appearing lymph nodes.

Ancillary findings:  None.
IMPRESSION: 1. Biopsy-proven malignancy within the UPPER LEFT breast, containing
biopsy clip artifact. Please note that extending 3 cm inferiorly and
posteriorly from this mass is linear enhancement which may represent
post biopsy changes or DCIS.
2. Two separate 1.2 cm areas of suspicious linear enhancement within
the slightly OUTER central LEFT breast. MR guided biopsy is
recommended of at least 1 of these areas if breast conservation is
considered.
3. No abnormal appearing lymph nodes.
4. No suspicious RIGHT breast findings.

RECOMMENDATION:
MR guided LEFT breast biopsy if breast conservation is desired.

BI-RADS CATEGORY  4: Suspicious.

ADDENDUM:
If breast conservation is desired, MR guided biopsy of the LEFT
br[REDACTED]ar area of enhancement extending from the biopsy-proven
malignancy is recommended, with area to be determined by the
performing radiologist.

*** End of Addendum ***
Three-dimensional MR images were rendered by post-processing of the
original MR data on an independent workstation. The
three-dimensional MR images were interpreted, and findings are
reported in the following complete MRI report for this study. Three
dimensional images were evaluated at the independent interpreting
workstation using the DynaCAD thin client.
FINDINGS: Breast composition: b. Scattered fibroglandular tissue.

Background parenchymal enhancement: Mild

Right breast: No suspicious mass or enhancement. Scattered foci
within the central RIGHT breast are noted.

Left breast: A 1 cm irregular enhancing mass within the UPPER LEFT
breast, middle depth, is identified containing biopsy clip artifact
(image 61: Series 12).

Extending 3 cm inferiorly and posteriorly from this mass are linear
areas of enhancement which may represent post biopsy changes or
possible DCIS, best seen on coronal/sagittal reconstructions.

Two separate 1.2 cm areas of indeterminate linear enhancement are
noted within the slightly OUTER central LEFT breast (81-86:12).
These 2 areas of linear enhancement are separated by a distance of
0.5 cm.

Lymph nodes: No abnormal appearing lymph nodes.

Ancillary findings:  None.
IMPRESSION: 1. Biopsy-proven malignancy within the UPPER LEFT breast, containing
biopsy clip artifact. Please note that extending 3 cm inferiorly and
posteriorly from this mass is linear enhancement which may represent
post biopsy changes or DCIS.
2. Two separate 1.2 cm areas of suspicious linear enhancement within
the slightly OUTER central LEFT breast. MR guided biopsy is
recommended of at least 1 of these areas if breast conservation is
considered.
3. No abnormal appearing lymph nodes.
4. No suspicious RIGHT breast findings.

RECOMMENDATION:
MR guided LEFT breast biopsy if breast conservation is desired.

BI-RADS CATEGORY  4: Suspicious.

## 2023-02-05 ENCOUNTER — Telehealth: Payer: Self-pay | Admitting: Oncology

## 2023-02-05 NOTE — Telephone Encounter (Signed)
02/05/23 Spoke with patient and ocnfirmed next appt.

## 2023-02-20 ENCOUNTER — Encounter: Payer: Self-pay | Admitting: Cardiology

## 2023-02-21 MED ORDER — MIDODRINE HCL 10 MG PO TABS: 10.0000 mg | ORAL_TABLET | Freq: Three times a day (TID) | ORAL | 3 refills | Status: AC

## 2023-03-05 ENCOUNTER — Other Ambulatory Visit: Payer: Self-pay | Admitting: Hematology and Oncology

## 2023-03-05 DIAGNOSIS — M8580 Other specified disorders of bone density and structure, unspecified site: Secondary | ICD-10-CM

## 2023-03-05 DIAGNOSIS — C50112 Malignant neoplasm of central portion of left female breast: Secondary | ICD-10-CM

## 2023-03-06 ENCOUNTER — Inpatient Hospital Stay: Payer: Medicare Other | Attending: Oncology | Admitting: Hematology and Oncology

## 2023-03-06 ENCOUNTER — Other Ambulatory Visit (HOSPITAL_COMMUNITY): Payer: Self-pay

## 2023-03-06 ENCOUNTER — Encounter: Payer: Self-pay | Admitting: Hematology and Oncology

## 2023-03-06 ENCOUNTER — Inpatient Hospital Stay: Payer: Medicare Other

## 2023-03-06 VITALS — BP 126/63 | HR 91 | Temp 98.3°F | Resp 18 | Ht 65.0 in | Wt 184.0 lb

## 2023-03-06 DIAGNOSIS — Z1732 Human epidermal growth factor receptor 2 negative status: Secondary | ICD-10-CM | POA: Insufficient documentation

## 2023-03-06 DIAGNOSIS — Z8051 Family history of malignant neoplasm of kidney: Secondary | ICD-10-CM | POA: Insufficient documentation

## 2023-03-06 DIAGNOSIS — Z780 Asymptomatic menopausal state: Secondary | ICD-10-CM | POA: Diagnosis not present

## 2023-03-06 DIAGNOSIS — M8589 Other specified disorders of bone density and structure, multiple sites: Secondary | ICD-10-CM | POA: Diagnosis not present

## 2023-03-06 DIAGNOSIS — Z79899 Other long term (current) drug therapy: Secondary | ICD-10-CM | POA: Insufficient documentation

## 2023-03-06 DIAGNOSIS — M8580 Other specified disorders of bone density and structure, unspecified site: Secondary | ICD-10-CM | POA: Diagnosis not present

## 2023-03-06 DIAGNOSIS — Z801 Family history of malignant neoplasm of trachea, bronchus and lung: Secondary | ICD-10-CM | POA: Diagnosis not present

## 2023-03-06 DIAGNOSIS — Z803 Family history of malignant neoplasm of breast: Secondary | ICD-10-CM | POA: Diagnosis not present

## 2023-03-06 DIAGNOSIS — Z1722 Progesterone receptor negative status: Secondary | ICD-10-CM | POA: Diagnosis not present

## 2023-03-06 DIAGNOSIS — Z170 Estrogen receptor positive status [ER+]: Secondary | ICD-10-CM

## 2023-03-06 DIAGNOSIS — Z808 Family history of malignant neoplasm of other organs or systems: Secondary | ICD-10-CM | POA: Diagnosis not present

## 2023-03-06 DIAGNOSIS — C50112 Malignant neoplasm of central portion of left female breast: Secondary | ICD-10-CM | POA: Insufficient documentation

## 2023-03-06 DIAGNOSIS — Z79811 Long term (current) use of aromatase inhibitors: Secondary | ICD-10-CM | POA: Diagnosis not present

## 2023-03-06 DIAGNOSIS — N390 Urinary tract infection, site not specified: Secondary | ICD-10-CM | POA: Diagnosis not present

## 2023-03-06 DIAGNOSIS — Z9012 Acquired absence of left breast and nipple: Secondary | ICD-10-CM | POA: Diagnosis not present

## 2023-03-06 LAB — CMP (CANCER CENTER ONLY)
ALT: 24 U/L (ref 0–44)
AST: 33 U/L (ref 15–41)
Albumin: 4.6 g/dL (ref 3.5–5.0)
Alkaline Phosphatase: 40 U/L (ref 38–126)
Anion gap: 13 (ref 5–15)
BUN: 21 mg/dL (ref 8–23)
CO2: 25 mmol/L (ref 22–32)
Calcium: 11.3 mg/dL — ABNORMAL HIGH (ref 8.9–10.3)
Chloride: 103 mmol/L (ref 98–111)
Creatinine: 1.22 mg/dL — ABNORMAL HIGH (ref 0.44–1.00)
GFR, Estimated: 49 mL/min — ABNORMAL LOW (ref >60)
Glucose, Bld: 130 mg/dL — ABNORMAL HIGH (ref 70–99)
Potassium: 4.2 mmol/L (ref 3.5–5.1)
Sodium: 140 mmol/L (ref 135–145)
Total Bilirubin: 0.5 mg/dL (ref 0.0–1.2)
Total Protein: 7.7 g/dL (ref 6.5–8.1)

## 2023-03-06 LAB — CBC WITH DIFFERENTIAL (CANCER CENTER ONLY)
Abs Immature Granulocytes: 0.02 10*3/uL (ref 0.00–0.07)
Basophils Absolute: 0 10*3/uL (ref 0.0–0.1)
Basophils Relative: 0 %
Eosinophils Absolute: 0.1 10*3/uL (ref 0.0–0.5)
Eosinophils Relative: 1 %
HCT: 42.1 % (ref 36.0–46.0)
Hemoglobin: 14.4 g/dL (ref 12.0–15.0)
Immature Granulocytes: 0 %
Lymphocytes Relative: 32 %
Lymphs Abs: 2.2 10*3/uL (ref 0.7–4.0)
MCH: 31.9 pg (ref 26.0–34.0)
MCHC: 34.2 g/dL (ref 30.0–36.0)
MCV: 93.3 fL (ref 80.0–100.0)
Monocytes Absolute: 0.6 10*3/uL (ref 0.1–1.0)
Monocytes Relative: 9 %
Neutro Abs: 4 10*3/uL (ref 1.7–7.7)
Neutrophils Relative %: 58 %
Platelet Count: 292 10*3/uL (ref 150–400)
RBC: 4.51 MIL/uL (ref 3.87–5.11)
RDW: 13.3 % (ref 11.5–15.5)
WBC Count: 6.9 10*3/uL (ref 4.0–10.5)
nRBC: 0 % (ref 0.0–0.2)
nRBC: 0 /100{WBCs}

## 2023-03-06 NOTE — Progress Notes (Signed)
 Pt arrived to [facility] appt early, per tx guidelines will reschedule pt.

## 2023-03-06 NOTE — Assessment & Plan Note (Signed)
 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  She was treated with left mastectomy followed by adjuvant chemotherapy and radiation therapy.  She continues adjuvant endocrine therapy with anastrozole 1 mg daily with plans for at least 5 years of treatment.  She remains without evidence of recurrence.  She is on Prolia every 6 months for osteopenia and with spinal fracture.  We will plan to see her back in 6 months with a CBC and comprehensive metabolic panel prior to her next Prolia.

## 2023-03-06 NOTE — Assessment & Plan Note (Addendum)
 Osteopenia.  Bone density scan at [facility] in February 2023 revealed a T-score of -1.6 in the spine and a T-score of 0.1 in the femur.  She had compression fractures of T10 and L2 and L3, February and March 2024, from severe falls. She had kyphoplasty of T10 in April at [facility].  She now is a cervical fusion for degenerative disease.  She was started on Prolia injections every 6 months in March 2024.  At her last visit, she was not taking calcium, only high dose vitamin D weekly, so she was started on calcium.  She now has mild hypercalcemia, so we will discontinue oral calcium.  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  She will proceed with Prolia today.  She is overdue for bone density scan, so we will get that scheduled.  I will plan to see her back in 6 months with a CBC and comprehensive metabolic panel prior to her next Prolia.

## 2023-03-06 NOTE — Progress Notes (Signed)
 [facility]  [address] [place],  [place]  [address] [phone]  Clinic Day:  03/06/2023  Referring physician: Nonnie Done., MD  ASSESSMENT & PLAN:   Assessment & Plan: Osteopenia after menopause Osteopenia.  Bone density scan at [facility] medical in February 2023 revealed a T-score of -1.6 in the spine and a T-score of 0.1 in the femur.  She had compression fractures of T10 and L2 and L3, February and March 2024, from severe falls. She had kyphoplasty of T10 in April at [facility].  She now is a cervical fusion for degenerative disease.  She was started on Prolia injections every 6 months in March 2024.  At her last visit, she was not taking calcium, only high dose vitamin D weekly, so she was started on calcium.  She now has mild hypercalcemia, so we will discontinue oral calcium.  She will proceed with Prolia today.  She is overdue for bone density scan, so we will get that scheduled.  I will plan to see her back in 6 months with a CBC and comprehensive metabolic panel prior to her next Prolia.  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  She was treated with left mastectomy followed by adjuvant chemotherapy and radiation therapy.  She continues adjuvant endocrine therapy with anastrozole 1 mg daily with plans for at least 5 years of treatment.  She remains without evidence of recurrence.  She is on Prolia every 6 months for osteopenia and with spinal fracture.  We will plan to see her back in 6 months with a CBC and comprehensive metabolic panel prior to her next Prolia.    The patient understands the plans discussed today and is in agreement with them.  She knows to contact our office if she develops concerns prior to her next appointment.   I provided 15 minutes of face-to-face time during this encounter and > 50%  was spent counseling as documented under my assessment and plan.    Adah Perl, PA-C  [facility] [place] - A DEPT OF [facility] [address] [place] [address] Dept: [phone] Dept Fax: [phone]   Orders Placed This Encounter  Procedures   DG Bone Density    Standing Status:   Future    Expected Date:   03/13/2023    Expiration Date:   03/05/2024    Reason for Exam (SYMPTOM  OR DIAGNOSIS REQUIRED):   osteopenia    Preferred imaging location?:   External   CBC with Differential (Cancer Center Only)    Standing Status:   Future    Expected Date:   09/06/2023    Expiration Date:   03/05/2024   CMP (Cancer Center only)    Standing Status:   Future    Expected Date:   09/06/2023    Expiration Date:   03/05/2024      CHIEF COMPLAINT:  CC: Stage IIA hormone receptor positive breast cancer/Osteopenia after menopause with fracture  Current Treatment: Anastrozole 1 mg daily/Prolia every 6 months  HISTORY OF PRESENT ILLNESS:  Brandy Maxwell is a 68 year old with a history of stage IIA (T1b N1a 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 She was treated with left mastectomy.  One node was positive for  malignancy. EndoPredict revealed EP clin score of 5.4, which corresponds with high risk for recurrence of 55% within the next 10 years.  The estimated chemotherapy benefit was as 27%.  She was treated with doxorubicin/cyclophosphamide for 4 cycles completed in May 2024.  She then received weekly paclitaxel for 8 of 12 planned cycles.  She had interruptions for various complications, including meningitis.  Chemotherapy was discontinued in September 2023 due to complications.  She was placed on hormonal therapy with anastrozole in October 2023.  She continues to follow with Dr. Georgiana Shore.  Right screening mammogram and September 2023 did not reveal any evidence  of malignancy.   Bone density scan at [facility] in February 2023 revealed a T-score of -1.6 in the spine and a T-score of 0.1 in the femur.  She had compression fractures of T10 and L2 and L3, February and March 2024, from severe falls. She had kyphoplasty of T10 in April at [facility].  She now is a cervical fusion for degenerative disease.  She was started on Prolia injections every 6 months in March 2024.  She continues to follow with Dr. Deloris Ping and is due for right screening mammogram in September.  Oncology History  Malignant neoplasm of central portion of left breast in female, estrogen receptor positive (HCC)  09/16/2020 Mammogram   DIGITAL SCREENING BILATERAL MAMMOGRAM: In the left breast, a possible mass warrants further evaluation. In the right breast, no findings suspicious for malignancy.    10/08/2020 Mammogram   DIAGNOSTIC UNILATERAL LEFT MAMMOGRAM AND LEFT BREAST ULTRASOUND: There is architectural distortion at the left breast 12 o'clock.  Targeted ultrasound is performed, showing no focal abnormal discrete cystic or solid lesion at left breast 12 o'clock the correlate to the mammographic finding. Ultrasound of the left axilla is negative.   10/20/2020 Pathology Results   Breast, left, needle core biopsy, 12 o'clock:             Invasive mammary carcinoma             Mammary carcinoma in situ HER2: Negative (0) ER: Positive 30% PR: Negative 0% Ki67: 1%   10/26/2020 Initial Diagnosis   Malignant neoplasm of central portion of left breast in female, estrogen receptor positive (HCC)   12/17/2020 Cancer Staging   Staging form: Breast, AJCC 8th Edition - Clinical stage from 12/17/2020: Stage IIA (cT1b, cN1(sn), cM0, G2, ER+, PR-, HER2-) - Signed by Dellia Beckwith, MD on 02/13/2021 Histopathologic type: Infiltrating duct carcinoma, NOS Stage prefix: Initial diagnosis Method of lymph node assessment: Sentinel lymph node biopsy Nuclear grade: G2 Multigene  prognostic tests performed: EndoPredict Histologic grading system: 3 grade system Laterality: Left Tumor size (mm): 7 Lymph-vascular invasion (LVI): LVI not present (absent)/not identified Diagnostic confirmation: Positive histology Specimen type: Excision Staged by: Managing physician Menopausal status: Postmenopausal Ki-67 (%): 1 EndoPredict EPclin risk score: 5.4 EndoPredict EPclin risk level: High risk EndoPredict 10-year percentage risk of distant recurrence (%): 55 EndoPredict 10-year risk of distant recurrence: High risk Stage used in treatment planning: Yes National guidelines used in treatment planning: Yes Type of national guideline used in treatment planning: NCCN Staging comments: Endopredict with 27% benefit from chemotherapy   12/17/2020 Pathology Results   Breast, simple mastectomy, left:         Invasive ductal carcinoma, grade 2, 7 mm, and ductal carcinoma in situ         Ribbon clip and biopsy reaction present         All margins negative for  invasive carcinoma and DCIS         Fibrocystic changes Lymph node, sentinel, biopsy, left axillary:         Tumor present in regional lymph node (1/1)   02/18/2021 - 08/30/2021 Chemotherapy   Patient is on Treatment Plan : Breast AC q21 Days / PACLitaxel q7d      Genetic Testing   Negative genetic testing. No pathogenic variants identified on the [facility] Multi-Cancer+RNA panel. The report date is 03/08/2021.  The Multi-Cancer Panel + RNA offered by [facility] includes sequencing and/or deletion duplication testing of the following 84 genes: AIP, ALK, APC, ATM, AXIN2,BAP1,  BARD1, BLM, BMPR1A, BRCA1, BRCA2, BRIP1, CASR, CDC73, CDH1, CDK4, CDKN1B, CDKN1C, CDKN2A (p14ARF), CDKN2A (p16INK4a), CEBPA, CHEK2, CTNNA1, DICER1, DIS3L2, EGFR (c.2369C>T, p.Thr790Met variant only), EPCAM (Deletion/duplication testing only), FH, FLCN, GATA2, GPC3, GREM1 (Promoter region deletion/duplication testing only), HOXB13 (c.251G>A, p.Gly84Glu), HRAS, KIT,  MAX, MEN1, MET, MITF (c.952G>A, p.Glu318Lys variant only), MLH1, MSH2, MSH3, MSH6, MUTYH, NBN, NF1, NF2, NTHL1, PALB2, PDGFRA, PHOX2B, PMS2, POLD1, POLE, POT1, PRKAR1A, PTCH1, PTEN, RAD50, RAD51C, RAD51D, RB1, RECQL4, RET, RUNX1, SDHAF2, SDHA (sequence changes only), SDHB, SDHC, SDHD, SMAD4, SMARCA4, SMARCB1, SMARCE1, STK11, SUFU, TERC, TERT, TMEM127, TP53, TSC1, TSC2, VHL, WRN and WT1.   02/18/2021 - 09/07/2021 Chemotherapy   Patient is on Treatment Plan : Breast AC q21 Days / PACLitaxel q7d         INTERVAL HISTORY:  Evva is here today for repeat clinical assessment prior to Prolia.  She states she continues anastrozole daily without significant difficulty she denies any changes in her right breast or left mastectomy site.  She reports continued falls, which she attributes to orthostatic hypotension.  She is on midodrine.  She states she is drinking 3-4 bottles of water a day.  She denies fevers or chills. She has noticed mild right paraspinal tenderness/pain in the lower thoracic area.  This is the area of previous kyphoplasty.  She states she follows up with her orthopedic surgeon this week.  I advised her to try heat or ice or alternating heat and ice to see if this helps.  Her appetite is good. Her weight has been stable.  Right screening mammogram in September 2024 did not reveal any evidence of malignancy.  Since her last visit, she has had cervical spine fusion due to worsening neck pain and radiculopathy after a fall.  She states she has been told she has kidney disease due to recurrent UTIs.  She is scheduled for Botox for urinary incontinence.  REVIEW OF SYSTEMS:  Review of Systems  Constitutional:  Negative for appetite change, chills, fatigue, fever and unexpected weight change.  HENT:   Negative for lump/mass, mouth sores and sore throat.   Respiratory:  Negative for cough and shortness of breath.   Cardiovascular:  Negative for chest pain and leg swelling.  Gastrointestinal:  Negative  for abdominal pain, constipation, diarrhea, nausea and vomiting.  Endocrine: Negative for hot flashes.  Genitourinary:  Negative for difficulty urinating, dysuria, frequency and hematuria.   Musculoskeletal:  Negative for arthralgias, back pain, gait problem and myalgias.  Skin:  Negative for rash.  Neurological:  Positive for dizziness. Negative for extremity weakness, gait problem, headaches, light-headedness and numbness.  Hematological:  Negative for adenopathy. Does not bruise/bleed easily.  Psychiatric/Behavioral:  Negative for depression and sleep disturbance. The patient is not nervous/anxious.      VITALS:  Blood pressure 126/63, pulse 91, temperature 98.3 F (36.8 C), temperature source Oral, resp. rate 18, height  5\' 5"  (1.651 m), weight 184 lb (83.5 kg), last menstrual period 09/02/1998, SpO2 95%.  Wt Readings from Last 3 Encounters:  03/06/23 184 lb (83.5 kg)  01/02/23 185 lb 6.4 oz (84.1 kg)  08/29/22 184 lb 12.8 oz (83.8 kg)    Body mass index is 30.62 kg/m.  Performance status (ECOG): 2 - Symptomatic, <50% confined to bed  PHYSICAL EXAM:  Physical Exam Vitals and nursing note reviewed.  Constitutional:      General: She is not in acute distress.    Appearance: Normal appearance.  HENT:     Head: Normocephalic and atraumatic.     Mouth/Throat:     Mouth: Mucous membranes are moist.     Pharynx: Oropharynx is clear. No oropharyngeal exudate or posterior oropharyngeal erythema.  Eyes:     General: No scleral icterus.    Extraocular Movements: Extraocular movements intact.     Conjunctiva/sclera: Conjunctivae normal.     Pupils: Pupils are equal, round, and reactive to light.  Cardiovascular:     Rate and Rhythm: Normal rate and regular rhythm.     Heart sounds: Normal heart sounds. No murmur heard.    No friction rub. No gallop.  Pulmonary:     Effort: Pulmonary effort is normal.     Breath sounds: Normal breath sounds. No wheezing, rhonchi or rales.  Chest:   Breasts:    Right: Normal. No swelling, bleeding, inverted nipple, mass, nipple discharge, skin change or tenderness.     Left: Absent.     Comments: Left mastectomy site is negative. Abdominal:     General: There is no distension.     Palpations: Abdomen is soft. There is no hepatomegaly, splenomegaly or mass.     Tenderness: There is no abdominal tenderness.  Musculoskeletal:        General: Normal range of motion.     Cervical back: Normal range of motion and neck supple. No tenderness.     Thoracic back: Tenderness (lower right lateral) present.     Right lower leg: No edema.     Left lower leg: No edema.  Lymphadenopathy:     Cervical: No cervical adenopathy.     Upper Body:     Right upper body: No supraclavicular or axillary adenopathy.     Left upper body: No supraclavicular or axillary adenopathy.     Lower Body: No right inguinal adenopathy. No left inguinal adenopathy.  Skin:    General: Skin is warm and dry.     Coloration: Skin is not jaundiced.     Findings: No rash.  Neurological:     Mental Status: She is alert and oriented to person, place, and time.     Cranial Nerves: No cranial nerve deficit.  Psychiatric:        Mood and Affect: Mood normal.        Behavior: Behavior normal.        Thought Content: Thought content normal.     LABS:      Latest Ref Rng & Units 03/06/2023    1:47 PM 08/29/2022    2:23 PM 06/01/2022    1:42 PM  CBC  WBC 4.0 - 10.5 K/uL 6.9  4.8  6.9   Hemoglobin 12.0 - 15.0 g/dL 16.1  09.6  04.5   Hematocrit 36.0 - 46.0 % 42.1  41.7  41.8   Platelets 150 - 400 K/uL 292  287  303       Latest Ref Rng & Units  03/06/2023    1:47 PM 08/29/2022    2:23 PM 06/01/2022    1:42 PM  CMP  Glucose 70 - 99 mg/dL 161  096  045   BUN 8 - 23 mg/dL 21  16  14    Creatinine 0.44 - 1.00 mg/dL 4.09  8.11  9.14   Sodium 135 - 145 mmol/L 140  140  137   Potassium 3.5 - 5.1 mmol/L 4.2  4.1  3.9   Chloride 98 - 111 mmol/L 103  104  102   CO2 22 - 32  mmol/L 25  27  25    Calcium 8.9 - 10.3 mg/dL 78.2  9.9  9.8   Total Protein 6.5 - 8.1 g/dL 7.7  7.4  7.4   Total Bilirubin 0.0 - 1.2 mg/dL 0.5  0.6  0.4   Alkaline Phos 38 - 126 U/L 40  28  38   AST 15 - 41 U/L 33  31  28   ALT 0 - 44 U/L 24  28  29       STUDIES:    Exam(s): [id number] MAM/MAM DIGITAL TOMO SCREENING R  CLINICAL DATA: Screening.  EXAM:  DIGITAL SCREENING UNILATERAL RIGHT MAMMOGRAM WITH CAD AND  TOMOSYNTHESIS  TECHNIQUE:  Right screening digital craniocaudal and mediolateral oblique  mammograms were obtained. Right screening digital breast  tomosynthesis was performed. The images were evaluated with  computer-aided detection.  COMPARISON: Previous exam(s).  ACR Breast Density Category a: The fibroglandular tissue is almost  entirely fatty.  FINDINGS:  There are no findings suspicious for malignancy.  IMPRESSION:  No mammographic evidence of malignancy. A result letter of this  screening mammogram will be mailed directly to the patient.  RECOMMENDATION:  Screening mammogram in one year. (Code:SM-B-01Y)  BI-RADS CATEGORY 1: Negative.    HISTORY:   Past Medical History:  Diagnosis Date   Abnormal mammogram of left breast 11/05/2018   Allergic rhinitis 08/30/2013   Altered consciousness 09/29/2020   Aortic atherosclerosis (HCC) 04/10/2022   Arthritis    Atypical ductal hyperplasia of right breast 05/21/2017   Bronchitis due to COVID-19 virus 12/15/2021   Tested positive for COVID-19     Cardiac murmur 09/23/2019   Cervical radiculopathy 08/22/2022   Cervical spinal stenosis 07/31/2022   Chronic kidney disease    recurrent kidney infections   Closed wedge compression fracture of L2 vertebra (HCC) 02/14/2022   Closed wedge compression fracture of T10 vertebra (HCC) 03/23/2022   Coccydynia 03/24/2015   Degeneration of thoracic or thoracolumbar intervertebral disc 02/12/2013   Depression with anxiety 08/30/2013   Diet-controlled diabetes mellitus (HCC)  10/31/2019   Difficulty in walking 08/30/2013   Disorder of sacrum 02/12/2013   Dyspnea on exertion 10/29/2020   Epilepsy (HCC) 08/30/2013   Excessive daytime sleepiness 07/03/2017   Family history of breast cancer    Family history of colon cancer    Family history of first degree relative with dementia 10/26/2017   Family history of lung cancer    Family history of prostate cancer    Family history of uterine cancer    Fibromyalgia    Gait abnormality 07/10/2012   Genetic testing 03/09/2021   Negative genetic testing. No pathogenic variants identified on the [facility] Multi-Cancer+RNA panel. The report date is 03/08/2021.  The Multi-Cancer Panel + RNA offered by [facility] includes sequencing and/or deletion duplication testing of the following 84 genes: AIP, ALK, APC, ATM, AXIN2,BAP1,  BARD1, BLM, BMPR1A, BRCA1, BRCA2, BRIP1, CASR, CDC73, CDH1, CDK4, CDKN1B, CDKN1C,  CDKN2A (p14ARF), CDKN2A (   Herniated cervical disc 07/31/2022   Herpes zoster with nervous system complications 02/12/2013   History of left mastectomy 10/02/2022   History of seizure disorder 03/17/2019   Hypercholesteremia    Hyperlipidemia 08/30/2013   IBS (irritable bowel syndrome)    Insomnia 09/07/2015   Lumbar transverse process fracture (HCC) 02/14/2022   Malignant neoplasm of central portion of left breast in female, estrogen receptor positive (HCC) 10/26/2020   Mass of upper outer quadrant of left breast 05/21/2017   Medication management 01/31/2019   Migraine headache 08/30/2013   Mild cognitive impairment 03/17/2019   Muscle weakness (generalized) 07/25/2021   Myalgia and myositis 02/12/2013   Neurogenic bladder 05/21/2020   Neuropathic pain 07/22/2020   Neuropathy 03/17/2019   Obesity (BMI 30.0-34.9) 03/17/2019   Obesity, Class I, BMI 30-34.9 03/17/2019   Obstructive sleep apnea (adult) (pediatric) 08/30/2013   Osteopenia after menopause 02/10/2021   Pain in thoracic spine 02/12/2013   Palpitations  09/23/2019   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   Postlaminectomy syndrome, lumbar region 02/12/2013   Postoperative examination 06/19/2017   Postural hypotension 09/23/2019   Recurrent urinary tract infection 08/29/2021   Restless legs syndrome 05/22/2017   Risk for falls 09/07/2015   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   Seizures (HCC)    Sleep apnea    nightly cpap   Spondylosis of thoracic region without myelopathy or radiculopathy 02/12/2013   Spondylosis without myelopathy or radiculopathy, cervical region 07/31/2022   Syncope and collapse 03/04/2022   Thoracic or lumbosacral neuritis or radiculitis 02/12/2013   Thoracic spondylosis with myelopathy 02/12/2013   Tremors of nervous system    Urinary incontinence 03/17/2019   Urinary incontinence, mixed 05/29/2013   Vitamin B 12 deficiency 03/17/2019   Vitamin D deficiency 03/17/2019   Weakness 07/10/2012    Past Surgical History:  Procedure Laterality Date   BLADDER REPAIR  03/13/2018   BLADDER SUSPENSION  03/13/2018   BREAST LUMPECTOMY  2019   CERVICAL FUSION  12/2022   COLONOSCOPY  12/20/2012   Left colonic diverticulosis predominantly in the sigmoid colon. Small internal hemmorhoids. Otherwise normal colonoscopy.    LAMINECTOMY  1998    Family History  Problem Relation Age of Onset   Hypercholesterolemia Mother    Alzheimer's disease Mother    Heart Problems Father    Clotting disorder Father    Alzheimer's disease Father    Prostate cancer Father        dx 74s   Allergic rhinitis Sister    Colon cancer Maternal Grandfather        64s   Brain cancer Paternal Grandfather        53s   Uterine cancer Maternal Aunt        dx 25s, d. 72   Breast cancer Maternal Aunt 60       recurrence, metastatic, d. 61   Hemophilia Maternal Aunt    Breast cancer Maternal Aunt        dx 4-s d. 81s   Lung cancer Maternal Uncle 50       smoker   Lung cancer Maternal Uncle         28s   Breast cancer Paternal Aunt 35       dx 51s d. 52, 3 recurrences   Liver cancer Paternal Uncle    Kidney cancer Paternal Uncle    Colon cancer Cousin    Breast cancer Cousin  dx 20s, bilateral mastectomy   Esophageal cancer Neg Hx    Stomach cancer Neg Hx    Rectal cancer Neg Hx     Social History:  reports that she has never smoked. She has never used smokeless tobacco. She reports that she does not drink alcohol and does not use drugs.The patient is alone today.  Allergies:  Allergies  Allergen Reactions   Nitrofurantoin Anaphylaxis, Dermatitis, Diarrhea, Other (See Comments), Hives, Itching, Nausea Only, Palpitations, Photosensitivity, Rash, Shortness Of Breath, Swelling, Tinitus and Nausea And Vomiting    Other Reaction(s): Fatique  Other Reaction(s): Abdominal Pain, Confusion, Confusion (intolerance), Dermatitis, Dermatitis, Lethargy (intolerance), Other, Seizures   Sulfa Antibiotics Hives, Other (See Comments) and Rash    Other reaction(s): Rash  Other Reaction(s): Other (See Comments)    Other reaction(s): Rash    Current Medications: Current Outpatient Medications  Medication Sig Dispense Refill   cephALEXin (KEFLEX) 500 MG capsule Take 500 mg by mouth daily.     anastrozole (ARIMIDEX) 1 MG tablet TAKE 1 TABLET BY MOUTH EVERY DAY 90 tablet 3   chlorhexidine (PERIDEX) 0.12 % solution Use as directed 5 mLs in the mouth or throat as directed.     cyclobenzaprine (FLEXERIL) 10 MG tablet Take 10 mg by mouth 3 (three) times daily as needed for muscle spasms.     denosumab (XGEVA) 120 MG/1.7ML SOLN injection Inject 120 mg into the skin every 6 (six) months.     dicyclomine (BENTYL) 10 MG capsule Take 10 mg by mouth 3 (three) times daily as needed for pain. With IBS     EPINEPHrine 0.3 mg/0.3 mL IJ SOAJ injection Inject 0.3 mg into the muscle as needed for anaphylaxis.     ezetimibe (ZETIA) 10 MG tablet Take 10 mg by mouth daily.     fenofibrate 160 MG tablet Take  160 mg by mouth daily.     gabapentin (NEURONTIN) 600 MG tablet Take 1,200 mg by mouth 3 (three) times daily.     levETIRAcetam (KEPPRA) 750 MG tablet Take 1.5 tablets by mouth 2 (two) times daily.     lidocaine (LIDODERM) 5 % Place 1 patch onto the skin daily. (Patient not taking: Reported on 03/06/2023)     linaclotide (LINZESS) 145 MCG CAPS capsule Take 145 mcg by mouth as needed (severe constipation).     LINZESS 72 MCG capsule TAKE 1 CAPSULE BY MOUTH DAILY. 90 capsule 4   midodrine (PROAMATINE) 10 MG tablet Take 1 tablet (10 mg total) by mouth 3 (three) times daily. 270 tablet 3   mirabegron ER (MYRBETRIQ) 50 MG TB24 tablet Take 1 tablet by mouth daily.     omeprazole (PRILOSEC OTC) 20 MG tablet Take 1 tablet by mouth daily.     pramipexole (MIRAPEX) 1 MG tablet Take 1 mg by mouth at bedtime.     traMADol (ULTRAM) 50 MG tablet Take 50 mg by mouth every 6 (six) hours as needed for moderate pain (pain score 4-6) or severe pain (pain score 7-10).     traZODone (DESYREL) 50 MG tablet Take 50 mg by mouth at bedtime.     Vitamin D, Ergocalciferol, (DRISDOL) 1.25 MG (50000 UT) CAPS capsule TAKE 1 CAPSULE BY MOUTH ONCE A WEEK FOR 30 DAYS     vitamin E 1000 UNIT capsule Take 1,000 Units by mouth daily.     zinc gluconate 50 MG tablet Take 50 mg by mouth daily.     No current facility-administered medications for this visit.

## 2023-03-13 ENCOUNTER — Encounter: Payer: Self-pay | Admitting: Vascular Surgery

## 2023-03-15 ENCOUNTER — Telehealth: Payer: Self-pay

## 2023-03-15 ENCOUNTER — Other Ambulatory Visit: Payer: Self-pay | Admitting: Hematology and Oncology

## 2023-03-15 DIAGNOSIS — R300 Dysuria: Secondary | ICD-10-CM

## 2023-03-15 DIAGNOSIS — Z780 Asymptomatic menopausal state: Secondary | ICD-10-CM

## 2023-03-15 NOTE — Telephone Encounter (Signed)
 Patient left voice mail. She cancelled her bone density sch for [facility] she would like you to order it to be done here. She also is sch for a prolia injection tomorrow and thinks she may have a UTI.

## 2023-03-15 NOTE — Telephone Encounter (Signed)
 Pt LVM on nurse line asking if it is okay for her to come and get the Prolia injection tomorrow, as she has an UTI. Message sent to pharmacist,Susan Phy,RPH.   Darl Pikes Phy,RPH: Having a UTI is not a contraindication for getting the prolia, but if she prefers to reschedule, we can certainly do that.

## 2023-03-15 NOTE — Telephone Encounter (Signed)
 Patient notified and voiced understanding , she will come for lab/urine appt at 3 PM tomorrow.

## 2023-03-16 ENCOUNTER — Inpatient Hospital Stay

## 2023-03-16 ENCOUNTER — Other Ambulatory Visit: Payer: Self-pay | Admitting: Hematology and Oncology

## 2023-03-16 VITALS — BP 134/76 | HR 100 | Temp 98.1°F | Resp 18 | Ht 65.0 in | Wt 176.4 lb

## 2023-03-16 DIAGNOSIS — R300 Dysuria: Secondary | ICD-10-CM

## 2023-03-16 DIAGNOSIS — C50112 Malignant neoplasm of central portion of left female breast: Secondary | ICD-10-CM

## 2023-03-16 LAB — URINALYSIS, COMPLETE (UACMP) WITH MICROSCOPIC
Bilirubin Urine: NEGATIVE
Glucose, UA: NEGATIVE mg/dL
Ketones, ur: NEGATIVE mg/dL
Nitrite: NEGATIVE
Protein, ur: NEGATIVE mg/dL
Specific Gravity, Urine: 1.03 (ref 1.005–1.030)
WBC, UA: >50 WBC/hpf (ref 0–5)
pH: 6 (ref 5.0–8.0)

## 2023-03-16 MED ORDER — DENOSUMAB 60 MG/ML ~~LOC~~ SOSY
60.0000 mg | PREFILLED_SYRINGE | Freq: Once | SUBCUTANEOUS | Status: AC
  Administered 2023-03-16: 60 mg via SUBCUTANEOUS
  Filled 2023-03-16: qty 1

## 2023-03-16 NOTE — Patient Instructions (Signed)
 Denosumab Injection (Osteoporosis) What is this medication? DENOSUMAB (den oh SUE mab) prevents and treats osteoporosis. It works by Interior and spatial designer stronger and less likely to break (fracture). It is a monoclonal antibody. This medicine may be used for other purposes; ask your health care provider or pharmacist if you have questions. COMMON BRAND NAME(S): Prolia What should I tell my care team before I take this medication? They need to know if you have any of these conditions: Dental or gum disease Had thyroid or parathyroid (glands located in neck) surgery Having dental surgery or a tooth pulled Kidney disease Low levels of calcium in the blood On dialysis Poor nutrition Thyroid disease Trouble absorbing nutrients from your food An unusual or allergic reaction to denosumab, other medications, foods, dyes, or preservatives Pregnant or trying to get pregnant Breastfeeding How should I use this medication? This medication is injected under the skin. It is given by your care team in a hospital or clinic setting. A special MedGuide will be given to you before each treatment. Be sure to read this information carefully each time. Talk to your care team about the use of this medication in children. Special care may be needed. Overdosage: If you think you have taken too much of this medicine contact a poison control center or emergency room at once. NOTE: This medicine is only for you. Do not share this medicine with others. What if I miss a dose? Keep appointments for follow-up doses. It is important not to miss your dose. Call your care team if you are unable to keep an appointment. What may interact with this medication? Do not take this medication with any of the following: Other medications that contain denosumab This medication may also interact with the following: Medications that lower your chance of fighting infection Steroid medications, such as prednisone or cortisone This  list may not describe all possible interactions. Give your health care provider a list of all the medicines, herbs, non-prescription drugs, or dietary supplements you use. Also tell them if you smoke, drink alcohol, or use illegal drugs. Some items may interact with your medicine. What should I watch for while using this medication? Your condition will be monitored carefully while you are receiving this medication. You may need blood work done while taking this medication. This medication may increase your risk of getting an infection. Call your care team for advice if you get a fever, chills, sore throat, or other symptoms of a cold or flu. Do not treat yourself. Try to avoid being around people who are sick. Tell your dentist and dental surgeon that you are taking this medication. You should not have major dental surgery while on this medication. See your dentist to have a dental exam and fix any dental problems before starting this medication. Take good care of your teeth while on this medication. Make sure you see your dentist for regular follow-up appointments. This medication may cause low levels of calcium in your body. The risk of severe side effects is increased in people with kidney disease. Your care team may prescribe calcium and vitamin D to help prevent low calcium levels while you take this medication. It is important to take calcium and vitamin D as directed by your care team. Talk to your care team if you may be pregnant. Serious birth defects may occur if you take this medication during pregnancy and for 5 months after the last dose. You will need a negative pregnancy test before starting this medication. Contraception  is recommended while taking this medication and for 5 months after the last dose. Your care team can help you find the option that works for you. Talk to your care team before breastfeeding. Changes to your treatment plan may be needed. What side effects may I notice from  receiving this medication? Side effects that you should report to your care team as soon as possible: Allergic reactions--skin rash, itching, hives, swelling of the face, lips, tongue, or throat Infection--fever, chills, cough, sore throat, wounds that don't heal, pain or trouble when passing urine, general feeling of discomfort or being unwell Low calcium level--muscle pain or cramps, confusion, tingling, or numbness in the hands or feet Osteonecrosis of the jaw--pain, swelling, or redness in the mouth, numbness of the jaw, poor healing after dental work, unusual discharge from the mouth, visible bones in the mouth Severe bone, joint, or muscle pain Skin infection--skin redness, swelling, warmth, or pain Side effects that usually do not require medical attention (report these to your care team if they continue or are bothersome): Back pain Headache Joint pain Muscle pain Pain in the hands, arms, legs, or feet Runny or stuffy nose Sore throat This list may not describe all possible side effects. Call your doctor for medical advice about side effects. You may report side effects to FDA at 1-800-FDA-1088. Where should I keep my medication? This medication is given in a hospital or clinic. It will not be stored at home. NOTE: This sheet is a summary. It may not cover all possible information. If you have questions about this medicine, talk to your doctor, pharmacist, or health care provider.  2024 Elsevier/Gold Standard (2022-01-24 00:00:00)

## 2023-03-18 LAB — URINE CULTURE: Culture: >=100000 — AB

## 2023-03-19 ENCOUNTER — Encounter: Payer: Self-pay | Admitting: Hematology and Oncology

## 2023-03-19 ENCOUNTER — Other Ambulatory Visit (HOSPITAL_BASED_OUTPATIENT_CLINIC_OR_DEPARTMENT_OTHER): Admitting: Radiology

## 2023-03-19 ENCOUNTER — Other Ambulatory Visit: Payer: Self-pay | Admitting: Hematology and Oncology

## 2023-03-19 MED ORDER — CIPROFLOXACIN HCL 500 MG PO TABS: 500.0000 mg | ORAL_TABLET | Freq: Two times a day (BID) | ORAL | 0 refills | Status: AC

## 2023-03-20 ENCOUNTER — Telehealth: Payer: Self-pay

## 2023-03-20 NOTE — Telephone Encounter (Signed)
 Pharmacy Patient Advocate Encounter   Received notification from CoverMyMeds that prior authorization for Linzess 72 mcg capsules is required/requested.   Insurance verification completed.   The patient is insured through [facility] .   Per test claim: PA required; PA submitted to above mentioned insurance via CoverMyMeds Key/confirmation #/EOC [id number] Status is pending

## 2023-03-20 NOTE — Telephone Encounter (Signed)
 Patient aware and started cipro last night, she will call us with any further concerns.

## 2023-03-22 ENCOUNTER — Other Ambulatory Visit (HOSPITAL_COMMUNITY): Payer: Self-pay

## 2023-03-22 NOTE — Telephone Encounter (Signed)
 Pharmacy Patient Advocate Encounter  Received notification from  xxx  that Prior Authorization for Linzess 72 mcg capsules has been CANCELLED due to dosage change to 145 mcg and PA not required. Co-pay is $126.94   PA #/Case ID/Reference #: [id number]

## 2023-03-23 ENCOUNTER — Ambulatory Visit (HOSPITAL_BASED_OUTPATIENT_CLINIC_OR_DEPARTMENT_OTHER)
Admission: RE | Admit: 2023-03-23 | Discharge: 2023-03-23 | Disposition: A | Discharge status: Home / Self Care | Source: Ambulatory Visit | Attending: Hematology and Oncology | Admitting: Hematology and Oncology

## 2023-03-23 DIAGNOSIS — Z780 Asymptomatic menopausal state: Secondary | ICD-10-CM

## 2023-03-23 DIAGNOSIS — M8580 Other specified disorders of bone density and structure, unspecified site: Secondary | ICD-10-CM

## 2023-03-27 ENCOUNTER — Telehealth: Payer: Self-pay

## 2023-03-27 NOTE — Telephone Encounter (Signed)
 Patient notified and voiced understanding.

## 2023-03-27 NOTE — Telephone Encounter (Signed)
-----   Message from Adah Perl sent at 03/23/2023  5:02 PM EDT ----- Please let her know her bone density is better. Thanks

## 2023-04-02 ENCOUNTER — Other Ambulatory Visit (HOSPITAL_COMMUNITY): Payer: Self-pay

## 2023-04-13 ENCOUNTER — Telehealth: Payer: Self-pay

## 2023-04-13 ENCOUNTER — Other Ambulatory Visit (HOSPITAL_COMMUNITY): Payer: Self-pay

## 2023-04-13 ENCOUNTER — Encounter: Payer: Self-pay | Admitting: Hematology and Oncology

## 2023-04-13 NOTE — Telephone Encounter (Signed)
 once a day please.

## 2023-04-13 NOTE — Telephone Encounter (Signed)
 Pharmacy Patient Advocate Encounter   Received notification from CoverMyMeds that prior authorization for Linzess capsules is required/requested.   Insurance verification completed.   The patient is insured through [facility] .   Per test claim: Dosage changed in March 2025? Waiting response from office.

## 2023-04-16 ENCOUNTER — Encounter: Payer: Self-pay | Admitting: Hematology and Oncology

## 2023-04-16 ENCOUNTER — Other Ambulatory Visit (HOSPITAL_COMMUNITY): Payer: Self-pay

## 2023-04-16 NOTE — Telephone Encounter (Signed)
 Pharmacy said its the pharmacy that is messing up her medications and [employer] called and gave her walk around on how to do the medication. But its the pharmacy fault and she says that she will call and see about this and she said we might have to do a 3 way call. Thank you

## 2023-05-08 ENCOUNTER — Telehealth: Payer: Self-pay | Admitting: Gastroenterology

## 2023-05-08 ENCOUNTER — Other Ambulatory Visit (HOSPITAL_COMMUNITY): Payer: Self-pay

## 2023-05-08 NOTE — Telephone Encounter (Signed)
 Patient can't get medication due to insurance issues. Told her I can do a patient assistant program for her to see if she can get approved for the medication but she has been out of her medication. Landon Pinion is there any advise you can give the patient because she said she needs her constipation medication? I told her she could try miralax but please advise Dr Venice Gillis I did place the patient assistant program on your desk for you to sign. But Dr Venice Gillis do you have any advice for this patient?

## 2023-05-08 NOTE — Telephone Encounter (Signed)
 Inbound call from patient's husband stating that patient has been out of Linzess . States pharmacy has not been filling medication. States a prior authorization is needed for Linzess  72 mg. Requesting a call back. Please advise, thank you.

## 2023-05-09 ENCOUNTER — Telehealth: Payer: Self-pay

## 2023-05-09 ENCOUNTER — Other Ambulatory Visit: Payer: Self-pay

## 2023-05-09 ENCOUNTER — Other Ambulatory Visit (HOSPITAL_COMMUNITY): Payer: Self-pay

## 2023-05-09 NOTE — Telephone Encounter (Signed)
 For now, please continue MiraLAX until we try to get Linzess  from patient assistance program approved RG

## 2023-05-09 NOTE — Telephone Encounter (Signed)
 Pharmacy Patient Advocate Encounter   Received notification from Pt Calls Messages that prior authorization for Linzess  capsules is required/requested.   Insurance verification completed.   The patient is insured through [facility] .   Per test claim: PA required; PA started via CoverMyMeds. KEY [id number] . Waiting for clinical questions to populate.

## 2023-05-09 NOTE — Telephone Encounter (Signed)
 Pharmacy Patient Advocate Encounter  Prior Authorization form/request asks a question that requires your assistance. Please see the question below and advise accordingly. The PA will not be submitted until the necessary information is received.

## 2023-05-09 NOTE — Telephone Encounter (Signed)
 Pt was notified of Dr. Venice Gillis recommendations: Pt stated that [employer] stated that they needed additional information for them to approve the PA.  Odilia Bennett please continue to assist with the PA please.  Brooke please continue to assist with the pt assistance please

## 2023-05-09 NOTE — Telephone Encounter (Signed)
 PA request has been Started. New Encounter has been or will be created for follow up. For additional info see Pharmacy Prior Auth telephone encounter from 05-09-2023.

## 2023-05-09 NOTE — Telephone Encounter (Signed)
 Pt made aware. Pt to be scheduled on 05/17/2023 at 1:30 PM with Deanna May NP. ( 7 day hold) Pt verbalized understanding with all questions answered.

## 2023-05-10 NOTE — Telephone Encounter (Signed)
 Patient made aware and appointment is made and mychart message

## 2023-05-10 NOTE — Telephone Encounter (Signed)
 Appointment has been scheduled per Ruthann Cover, CMA.

## 2023-05-17 ENCOUNTER — Encounter: Payer: Self-pay | Admitting: Gastroenterology

## 2023-05-17 ENCOUNTER — Ambulatory Visit (INDEPENDENT_AMBULATORY_CARE_PROVIDER_SITE_OTHER): Admitting: Gastroenterology

## 2023-05-17 VITALS — BP 128/80 | HR 91 | Ht 65.0 in | Wt 183.0 lb

## 2023-05-17 DIAGNOSIS — K581 Irritable bowel syndrome with constipation: Secondary | ICD-10-CM | POA: Diagnosis not present

## 2023-05-17 DIAGNOSIS — G8929 Other chronic pain: Secondary | ICD-10-CM | POA: Diagnosis not present

## 2023-05-17 DIAGNOSIS — R102 Pelvic and perineal pain: Secondary | ICD-10-CM | POA: Diagnosis not present

## 2023-05-17 NOTE — Progress Notes (Signed)
 Chief Complaint: follow-up constipation Primary GI Doctor: Dr. Venice Gillis  HPI: Brandy Maxwell is a 68 y.o. female with breast CA, chronic fatigue syndrome, fibromyalgia, chronic pain syndrome being followed by pain clinic, H LD, OSA on CPAP, seizure D/O with longstanding urinary problems including urinary incontinence SP retropubic mid urethral sling on 03/13/2018 with continued urge incontinence and chronic pelvic pain.  Patient last seen in the GI office by Dr. Venice Gillis on 03/10/2022 for IBS constipation and hemorrhoids.  At that time Dr. Venice Gillis increased the Linzess  to 145 mcg p.o. daily with MiraLAX as needed.  Over-the-counter prep H as needed.  Past GI procedures:   CT chest/Abdo/pelvis 01/26/2021 at [facility] -Hepatic steatosis -No acute abnormalities. -No metastatic disease.   Colon 02/2019 -Mild left colonic diverticulosis. -Non-bleeding internal hemorrhoids. -Otherwise normal colonoscopy to TI. -Rpt in 10 yrs   -Colonoscopy 12/2012 ([facility])-mild sigmoid diverticulosis, small internal hemorrhoids.  Advised repeat in 79yr due to FH  Interval History    Patient presents for follow-up on chronic constipation and currently doing well on Linzess  72 mcg po daily. She states in past she was having worsening constipation so they tried 145 mcg po daily. She states she did not require the higher dose all the time only occasionally, but unfortunately insurance wont cover both doses. She does report when she takes the Linzess  she has to be close to restroom or she will have accident due to urgency. She is pending PA for Linzess  72 mcg po daily. No blood in stool. She has been out of her medication recently which results in abdominal pain and bloating.  Wt Readings from Last 3 Encounters:  05/17/23 183 lb (83 kg)  03/16/23 176 lb 6 oz (80 kg)  03/06/23 184 lb (83.5 kg)    Past Medical History:  Diagnosis Date   Abnormal mammogram of left breast 11/05/2018   Allergic rhinitis 08/30/2013   Altered  consciousness 09/29/2020   Aortic atherosclerosis (HCC) 04/10/2022   Arthritis    Atypical ductal hyperplasia of right breast 05/21/2017   Bronchitis due to COVID-19 virus 12/15/2021   Tested positive for COVID-19     Cardiac murmur 09/23/2019   Cervical radiculopathy 08/22/2022   Cervical spinal stenosis 07/31/2022   Chronic kidney disease    recurrent kidney infections   Closed wedge compression fracture of L2 vertebra (HCC) 02/14/2022   Closed wedge compression fracture of T10 vertebra (HCC) 03/23/2022   Coccydynia 03/24/2015   Degeneration of thoracic or thoracolumbar intervertebral disc 02/12/2013   Depression with anxiety 08/30/2013   Diet-controlled diabetes mellitus (HCC) 10/31/2019   Difficulty in walking 08/30/2013   Disorder of sacrum 02/12/2013   Dyspnea on exertion 10/29/2020   Epilepsy (HCC) 08/30/2013   Excessive daytime sleepiness 07/03/2017   Family history of breast cancer    Family history of colon cancer    Family history of first degree relative with dementia 10/26/2017   Family history of lung cancer    Family history of prostate cancer    Family history of uterine cancer    Fibromyalgia    Gait abnormality 07/10/2012   Genetic testing 03/09/2021   Negative genetic testing. No pathogenic variants identified on the [facility] Multi-Cancer+RNA panel. The report date is 03/08/2021.  The Multi-Cancer Panel + RNA offered by [facility] includes sequencing and/or deletion duplication testing of the following 84 genes: AIP, ALK, APC, ATM, AXIN2,BAP1,  BARD1, BLM, BMPR1A, BRCA1, BRCA2, BRIP1, CASR, CDC73, CDH1, CDK4, CDKN1B, CDKN1C, CDKN2A (p14ARF), CDKN2A (   Herniated cervical disc  07/31/2022   Herpes zoster with nervous system complications 02/12/2013   History of left mastectomy 10/02/2022   History of seizure disorder 03/17/2019   Hypercholesteremia    Hyperlipidemia 08/30/2013   IBS (irritable bowel syndrome)    Insomnia 09/07/2015   Lumbar transverse process  fracture (HCC) 02/14/2022   Malignant neoplasm of central portion of left breast in female, estrogen receptor positive (HCC) 10/26/2020   Mass of upper outer quadrant of left breast 05/21/2017   Medication management 01/31/2019   Migraine headache 08/30/2013   Mild cognitive impairment 03/17/2019   Muscle weakness (generalized) 07/25/2021   Myalgia and myositis 02/12/2013   Neurogenic bladder 05/21/2020   Neuropathic pain 07/22/2020   Neuropathy 03/17/2019   Obesity (BMI 30.0-34.9) 03/17/2019   Obesity, Class I, BMI 30-34.9 03/17/2019   Obstructive sleep apnea (adult) (pediatric) 08/30/2013   Osteopenia after menopause 02/10/2021   Pain in thoracic spine 02/12/2013   Palpitations 09/23/2019   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   Postlaminectomy syndrome, lumbar region 02/12/2013   Postoperative examination 06/19/2017   Postural hypotension 09/23/2019   Recurrent urinary tract infection 08/29/2021   Restless legs syndrome 05/22/2017   Risk for falls 09/07/2015   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   Seizures (HCC)    Sleep apnea    nightly cpap   Spondylosis of thoracic region without myelopathy or radiculopathy 02/12/2013   Spondylosis without myelopathy or radiculopathy, cervical region 07/31/2022   Syncope and collapse 03/04/2022   Thoracic or lumbosacral neuritis or radiculitis 02/12/2013   Thoracic spondylosis with myelopathy 02/12/2013   Tremors of nervous system    Urinary incontinence 03/17/2019   Urinary incontinence, mixed 05/29/2013   Vitamin B 12 deficiency 03/17/2019   Vitamin D deficiency 03/17/2019   Weakness 07/10/2012    Past Surgical History:  Procedure Laterality Date   BLADDER REPAIR  03/13/2018   BLADDER SUSPENSION  03/13/2018   BREAST LUMPECTOMY  2019   CERVICAL FUSION  12/2022   COLONOSCOPY  12/20/2012   Left colonic diverticulosis predominantly in the sigmoid colon. Small internal hemmorhoids.  Otherwise normal colonoscopy.    LAMINECTOMY  1998    Current Outpatient Medications  Medication Sig Dispense Refill   anastrozole  (ARIMIDEX ) 1 MG tablet TAKE 1 TABLET BY MOUTH EVERY DAY 90 tablet 3   chlorhexidine (PERIDEX) 0.12 % solution Use as directed 5 mLs in the mouth or throat as directed.     ciprofloxacin  (CIPRO ) 500 MG tablet Take 1 tablet (500 mg total) by mouth 2 (two) times daily. 20 tablet 0   cyclobenzaprine (FLEXERIL) 10 MG tablet Take 10 mg by mouth 3 (three) times daily as needed for muscle spasms.     denosumab  (XGEVA ) 120 MG/1.7ML SOLN injection Inject 120 mg into the skin every 6 (six) months.     dicyclomine (BENTYL) 10 MG capsule Take 10 mg by mouth 3 (three) times daily as needed for pain. With IBS     EPINEPHrine 0.3 mg/0.3 mL IJ SOAJ injection Inject 0.3 mg into the muscle as needed for anaphylaxis.     ezetimibe (ZETIA) 10 MG tablet Take 10 mg by mouth daily.     fenofibrate 160 MG tablet Take 160 mg by mouth daily.     gabapentin (NEURONTIN) 600 MG tablet Take 1,200 mg by mouth 3 (three) times daily.     levETIRAcetam (KEPPRA) 750 MG tablet Take 1.5 tablets by mouth 2 (two) times daily.     lidocaine (LIDODERM) 5 % Place 1  patch onto the skin daily.     LINZESS  72 MCG capsule TAKE 1 CAPSULE BY MOUTH DAILY. 90 capsule 4   midodrine  (PROAMATINE ) 10 MG tablet Take 1 tablet (10 mg total) by mouth 3 (three) times daily. 270 tablet 3   mirabegron ER (MYRBETRIQ) 50 MG TB24 tablet Take 1 tablet by mouth daily.     omeprazole (PRILOSEC OTC) 20 MG tablet Take 1 tablet by mouth daily.     pramipexole (MIRAPEX) 1 MG tablet Take 1 mg by mouth at bedtime.     traMADol (ULTRAM) 50 MG tablet Take 50 mg by mouth every 6 (six) hours as needed for moderate pain (pain score 4-6) or severe pain (pain score 7-10).     traZODone (DESYREL) 50 MG tablet Take 50 mg by mouth at bedtime.     Vitamin D, Ergocalciferol, (DRISDOL) 1.25 MG (50000 UT) CAPS capsule TAKE 1 CAPSULE BY MOUTH ONCE A  WEEK FOR 30 DAYS     vitamin E 1000 UNIT capsule Take 1,000 Units by mouth daily.     zinc gluconate 50 MG tablet Take 50 mg by mouth daily.     No current facility-administered medications for this visit.    Allergies as of 05/17/2023 - Review Complete 05/17/2023  Allergen Reaction Noted   Nitrofurantoin Anaphylaxis, Dermatitis, Diarrhea, Other (See Comments), Hives, Itching, Nausea Only, Palpitations, Photosensitivity, Rash, Shortness Of Breath, Swelling, Tinitus, and Nausea And Vomiting 12/19/2018   Sulfa antibiotics Hives, Other (See Comments), and Rash 07/10/2012    Family History  Problem Relation Age of Onset   Hypercholesterolemia Mother    Alzheimer's disease Mother    Heart Problems Father    Clotting disorder Father    Alzheimer's disease Father    Prostate cancer Father        dx 71s   Allergic rhinitis Sister    Colon cancer Maternal Grandfather        53s   Brain cancer Paternal Grandfather        59s   Uterine cancer Maternal Aunt        dx 35s, d. 12   Breast cancer Maternal Aunt 60       recurrence, metastatic, d. 47   Hemophilia Maternal Aunt    Breast cancer Maternal Aunt        dx 4-s d. 43s   Lung cancer Maternal Uncle 50       smoker   Lung cancer Maternal Uncle        37s   Breast cancer Paternal Aunt 62       dx 2s d. 37, 3 recurrences   Liver cancer Paternal Uncle    Kidney cancer Paternal Uncle    Colon cancer Cousin    Breast cancer Cousin        dx 65s, bilateral mastectomy   Esophageal cancer Neg Hx    Stomach cancer Neg Hx    Rectal cancer Neg Hx     Review of Systems:    Constitutional: No weight loss, fever, chills, weakness or fatigue HEENT: Eyes: No change in vision               Ears, Nose, Throat:  No change in hearing or congestion Skin: No rash or itching Cardiovascular: No chest pain, chest pressure or palpitations   Respiratory: No SOB or cough Gastrointestinal: See HPI and otherwise negative Genitourinary: No dysuria  or change in urinary frequency Neurological: No headache, dizziness or syncope Musculoskeletal: No new muscle  or joint pain Hematologic: No bleeding or bruising Psychiatric: No history of depression or anxiety    Physical Exam:  Vital signs: BP 128/80   Pulse 91   Ht 5\' 5"  (1.651 m)   Wt 183 lb (83 kg)   LMP 09/02/1998 (Approximate)   BMI 30.45 kg/m   Constitutional:   Pleasant  female appears to be in NAD, Well developed, Well nourished, alert and cooperative Throat: Oral cavity and pharynx without inflammation, swelling or lesion.  Respiratory: Respirations even and unlabored. Lungs clear to auscultation bilaterally.   No wheezes, crackles, or rhonchi.  Cardiovascular: Normal S1, S2. Regular rate and rhythm. No peripheral edema, cyanosis or pallor.  Gastrointestinal:  Soft, nondistended, nontender. No rebound or guarding. Normal bowel sounds. No appreciable masses or hepatomegaly. Rectal:  Not performed.  Msk:  Symmetrical without gross deformities. Without edema, uses cane. Neurologic:  Alert and  oriented x4;  grossly normal neurologically.  Skin:   Dry and intact without significant lesions or rashes. Psychiatric: Oriented to person, place and time. Demonstrates good judgement and reason without abnormal affect or behaviors.  RELEVANT LABS AND IMAGING: CBC    Latest Ref Rng & Units 03/06/2023    1:47 PM 08/29/2022    2:23 PM 06/01/2022    1:42 PM  CBC  WBC 4.0 - 10.5 K/uL 6.9  4.8  6.9   Hemoglobin 12.0 - 15.0 g/dL 93.2  35.5  73.2   Hematocrit 36.0 - 46.0 % 42.1  41.7  41.8   Platelets 150 - 400 K/uL 292  287  303      CMP     Latest Ref Rng & Units 03/06/2023    1:47 PM 08/29/2022    2:23 PM 06/01/2022    1:42 PM  CMP  Glucose 70 - 99 mg/dL 202  542  706   BUN 8 - 23 mg/dL 21  16  14    Creatinine 0.44 - 1.00 mg/dL 2.37  6.28  3.15   Sodium 135 - 145 mmol/L 140  140  137   Potassium 3.5 - 5.1 mmol/L 4.2  4.1  3.9   Chloride 98 - 111 mmol/L 103  104  102   CO2 22 -  32 mmol/L 25  27  25    Calcium 8.9 - 10.3 mg/dL 17.6  9.9  9.8   Total Protein 6.5 - 8.1 g/dL 7.7  7.4  7.4   Total Bilirubin 0.0 - 1.2 mg/dL 0.5  0.6  0.4   Alkaline Phos 38 - 126 U/L 40  28  38   AST 15 - 41 U/L 33  31  28   ALT 0 - 44 U/L 24  28  29       Lab Results  Component Value Date   TSH 3.670 09/23/2019     Assessment: Encounter Diagnoses  Name Primary?   Irritable bowel syndrome with constipation Yes   Chronic female pelvic pain   1.) Chronic pelvic pain. S/P bladder urethral sling 03/2018.  Being followed by urology (Dr. Doyne Genin). Neg CT Abdo/pelvis January 2023.  2.) IBS-C (now component of IOC) doing well on Linzess , pending PA. Needed office visit.  Plan: - give samples of Linzess  72mcg until PA approved -colon recall 02/2029  Thank you for the courtesy of this consult. Please call me with any questions or concerns.   Larissa Pegg, FNP-C [facility] 05/17/2023, 1:59 PM  Cc: Lucius Sabins., MD

## 2023-05-17 NOTE — Patient Instructions (Signed)
 We have given you samples of the following medication to take: Linzess  72 mcg  Linzess  145 mcg   One capsule 1 hour before first meal of the day.  _______________________________________________________  If your blood pressure at your visit was 140/90 or greater, please contact your primary care physician to follow up on this.  _______________________________________________________  If you are age 68 or older, your body mass index should be between 23-30. Your Body mass index is 30.45 kg/m. If this is out of the aforementioned range listed, please consider follow up with your Primary Care Provider.  If you are age 50 or younger, your body mass index should be between 19-25. Your Body mass index is 30.45 kg/m. If this is out of the aformentioned range listed, please consider follow up with your Primary Care Provider.   ________________________________________________________  The [facility] providers would like to encourage you to use MYCHART to communicate with providers for non-urgent requests or questions.  Due to long hold times on the telephone, sending your provider a message by [facility] may be a faster and more efficient way to get a response.  Please allow 48 business hours for a response.  Please remember that this is for non-urgent requests.  _______________________________________________________ Thank you for trusting me with your gastrointestinal care. Deanna May, RNP

## 2023-05-18 ENCOUNTER — Telehealth: Payer: Self-pay

## 2023-05-18 NOTE — Telephone Encounter (Signed)
 Please see notes below,  Pt had office visit yesterday.  Please assist with PA

## 2023-05-18 NOTE — Telephone Encounter (Signed)
 Inbound call from patient, would like a follow up on Linzess , Patient states she spoke to her insurance, states they told her for our office to call the PA team to get medication approved.   Contact number is [phone] option 2

## 2023-05-18 NOTE — Telephone Encounter (Signed)
 Pharmacy Patient Advocate Encounter   Received notification from Pt Calls Messages that prior authorization for Linzess  capsules is required/requested.   Insurance verification completed.   The patient is insured through [employer] .   Per test claim: PA required; PA started via CoverMyMeds. KEY [id number] . Waiting for clinical questions to populate.

## 2023-05-21 ENCOUNTER — Other Ambulatory Visit (HOSPITAL_COMMUNITY): Payer: Self-pay

## 2023-05-21 NOTE — Telephone Encounter (Signed)
 Pharmacy Patient Advocate Encounter   Received notification from Pt Calls Messages that prior authorization for  Linzess  capsules is required/requested.   Insurance verification completed.   The patient is insured through [employer] [facility] .   Per test claim: PA required; PA submitted to above mentioned insurance via CoverMyMeds Key/confirmation #/EOC [id number]  Status is pending

## 2023-05-21 NOTE — Telephone Encounter (Signed)
 Pharmacy Patient Advocate Encounter  Received notification from [employer] [facility] that Prior Authorization for Linzess  capsules has been APPROVED from 04-21-2023 to 05-20-2024   PA #/Case ID/Reference #: [id number]

## 2023-05-21 NOTE — Telephone Encounter (Signed)
 Pt was made aware of the PA approval. Pt stated that wants to know how much the actual medication will be after the PA. Pt was notified to call her pharmacy to find out the exact amount and that I would give her a call back tomorrow to discuss if she can afford the medication.  Pt verbalized understanding with all questions answered.

## 2023-05-22 NOTE — Telephone Encounter (Signed)
 Pt stated that she contacted the pharmacy and was notified that the medication was not going to be any charge. Pt was thankful and expressed much thankfulness Pt verbalized understanding with all questions answered.

## 2023-05-22 NOTE — Telephone Encounter (Signed)
 Left message for pt to call back

## 2023-06-17 ENCOUNTER — Other Ambulatory Visit: Payer: Self-pay | Admitting: Gastroenterology

## 2023-07-11 ENCOUNTER — Encounter (INDEPENDENT_AMBULATORY_CARE_PROVIDER_SITE_OTHER): Payer: Self-pay

## 2023-09-18 ENCOUNTER — Inpatient Hospital Stay

## 2023-09-18 ENCOUNTER — Other Ambulatory Visit: Payer: Self-pay | Admitting: Oncology

## 2023-09-18 ENCOUNTER — Encounter: Payer: Self-pay | Admitting: Oncology

## 2023-09-18 ENCOUNTER — Inpatient Hospital Stay: Attending: Oncology | Admitting: Oncology

## 2023-09-18 ENCOUNTER — Telehealth: Payer: Self-pay | Admitting: Oncology

## 2023-09-18 VITALS — BP 109/78 | HR 97 | Temp 97.9°F | Resp 18 | Ht 65.0 in | Wt 177.5 lb

## 2023-09-18 DIAGNOSIS — N952 Postmenopausal atrophic vaginitis: Secondary | ICD-10-CM

## 2023-09-18 DIAGNOSIS — C50112 Malignant neoplasm of central portion of left female breast: Secondary | ICD-10-CM | POA: Diagnosis not present

## 2023-09-18 DIAGNOSIS — Z1732 Human epidermal growth factor receptor 2 negative status: Secondary | ICD-10-CM | POA: Insufficient documentation

## 2023-09-18 DIAGNOSIS — M8589 Other specified disorders of bone density and structure, multiple sites: Secondary | ICD-10-CM | POA: Diagnosis present

## 2023-09-18 DIAGNOSIS — Z1721 Progesterone receptor positive status: Secondary | ICD-10-CM | POA: Diagnosis not present

## 2023-09-18 DIAGNOSIS — Z780 Asymptomatic menopausal state: Secondary | ICD-10-CM

## 2023-09-18 DIAGNOSIS — M8580 Other specified disorders of bone density and structure, unspecified site: Secondary | ICD-10-CM | POA: Diagnosis not present

## 2023-09-18 DIAGNOSIS — Z170 Estrogen receptor positive status [ER+]: Secondary | ICD-10-CM | POA: Diagnosis not present

## 2023-09-18 DIAGNOSIS — Z79899 Other long term (current) drug therapy: Secondary | ICD-10-CM | POA: Diagnosis not present

## 2023-09-18 LAB — CBC WITH DIFFERENTIAL (CANCER CENTER ONLY)
Abs Immature Granulocytes: 0.04 K/uL (ref 0.00–0.07)
Basophils Absolute: 0 K/uL (ref 0.0–0.1)
Basophils Relative: 0 %
Eosinophils Absolute: 0.1 K/uL (ref 0.0–0.5)
Eosinophils Relative: 1 %
HCT: 41.6 % (ref 36.0–46.0)
Hemoglobin: 14.1 g/dL (ref 12.0–15.0)
Immature Granulocytes: 1 %
Lymphocytes Relative: 25 %
Lymphs Abs: 1.7 K/uL (ref 0.7–4.0)
MCH: 31.3 pg (ref 26.0–34.0)
MCHC: 33.9 g/dL (ref 30.0–36.0)
MCV: 92.4 fL (ref 80.0–100.0)
Monocytes Absolute: 0.7 K/uL (ref 0.1–1.0)
Monocytes Relative: 10 %
Neutro Abs: 4.4 K/uL (ref 1.7–7.7)
Neutrophils Relative %: 63 %
Platelet Count: 321 K/uL (ref 150–400)
RBC: 4.5 MIL/uL (ref 3.87–5.11)
RDW: 13 % (ref 11.5–15.5)
WBC Count: 6.9 K/uL (ref 4.0–10.5)
nRBC: 0 % (ref 0.0–0.2)

## 2023-09-18 LAB — CMP (CANCER CENTER ONLY)
ALT: 24 U/L (ref 0–44)
AST: 30 U/L (ref 15–41)
Albumin: 4.4 g/dL (ref 3.5–5.0)
Alkaline Phosphatase: 39 U/L (ref 38–126)
Anion gap: 13 (ref 5–15)
BUN: 16 mg/dL (ref 8–23)
CO2: 25 mmol/L (ref 22–32)
Calcium: 10.5 mg/dL — ABNORMAL HIGH (ref 8.9–10.3)
Chloride: 101 mmol/L (ref 98–111)
Creatinine: 1.15 mg/dL — ABNORMAL HIGH (ref 0.44–1.00)
GFR, Estimated: 52 mL/min — ABNORMAL LOW (ref >60)
Glucose, Bld: 79 mg/dL (ref 70–99)
Potassium: 3.9 mmol/L (ref 3.5–5.1)
Sodium: 140 mmol/L (ref 135–145)
Total Bilirubin: 0.5 mg/dL (ref 0.0–1.2)
Total Protein: 7.9 g/dL (ref 6.5–8.1)

## 2023-09-18 MED ORDER — DENOSUMAB 60 MG/ML ~~LOC~~ SOSY
60.0000 mg | PREFILLED_SYRINGE | Freq: Once | SUBCUTANEOUS | Status: AC
  Administered 2023-09-18: 60 mg via SUBCUTANEOUS
  Filled 2023-09-18: qty 1

## 2023-09-18 MED ORDER — PREMARIN 0.625 MG/GM VA CREA: 1.0000 | TOPICAL_CREAM | Freq: Every day | VAGINAL | 12 refills | Status: AC

## 2023-09-18 NOTE — Patient Instructions (Signed)
 Denosumab Injection (Osteoporosis) What is this medication? DENOSUMAB (den oh SUE mab) prevents and treats osteoporosis. It works by Interior and spatial designer stronger and less likely to break (fracture). It is a monoclonal antibody. This medicine may be used for other purposes; ask your health care provider or pharmacist if you have questions. COMMON BRAND NAME(S): Prolia What should I tell my care team before I take this medication? They need to know if you have any of these conditions: Dental or gum disease Had thyroid or parathyroid (glands located in neck) surgery Having dental surgery or a tooth pulled Kidney disease Low levels of calcium in the blood On dialysis Poor nutrition Thyroid disease Trouble absorbing nutrients from your food An unusual or allergic reaction to denosumab, other medications, foods, dyes, or preservatives Pregnant or trying to get pregnant Breastfeeding How should I use this medication? This medication is injected under the skin. It is given by your care team in a hospital or clinic setting. A special MedGuide will be given to you before each treatment. Be sure to read this information carefully each time. Talk to your care team about the use of this medication in children. Special care may be needed. Overdosage: If you think you have taken too much of this medicine contact a poison control center or emergency room at once. NOTE: This medicine is only for you. Do not share this medicine with others. What if I miss a dose? Keep appointments for follow-up doses. It is important not to miss your dose. Call your care team if you are unable to keep an appointment. What may interact with this medication? Do not take this medication with any of the following: Other medications that contain denosumab This medication may also interact with the following: Medications that lower your chance of fighting infection Steroid medications, such as prednisone or cortisone This  list may not describe all possible interactions. Give your health care provider a list of all the medicines, herbs, non-prescription drugs, or dietary supplements you use. Also tell them if you smoke, drink alcohol, or use illegal drugs. Some items may interact with your medicine. What should I watch for while using this medication? Your condition will be monitored carefully while you are receiving this medication. You may need blood work done while taking this medication. This medication may increase your risk of getting an infection. Call your care team for advice if you get a fever, chills, sore throat, or other symptoms of a cold or flu. Do not treat yourself. Try to avoid being around people who are sick. Tell your dentist and dental surgeon that you are taking this medication. You should not have major dental surgery while on this medication. See your dentist to have a dental exam and fix any dental problems before starting this medication. Take good care of your teeth while on this medication. Make sure you see your dentist for regular follow-up appointments. This medication may cause low levels of calcium in your body. The risk of severe side effects is increased in people with kidney disease. Your care team may prescribe calcium and vitamin D to help prevent low calcium levels while you take this medication. It is important to take calcium and vitamin D as directed by your care team. Talk to your care team if you may be pregnant. Serious birth defects may occur if you take this medication during pregnancy and for 5 months after the last dose. You will need a negative pregnancy test before starting this medication. Contraception  is recommended while taking this medication and for 5 months after the last dose. Your care team can help you find the option that works for you. Talk to your care team before breastfeeding. Changes to your treatment plan may be needed. What side effects may I notice from  receiving this medication? Side effects that you should report to your care team as soon as possible: Allergic reactions--skin rash, itching, hives, swelling of the face, lips, tongue, or throat Infection--fever, chills, cough, sore throat, wounds that don't heal, pain or trouble when passing urine, general feeling of discomfort or being unwell Low calcium level--muscle pain or cramps, confusion, tingling, or numbness in the hands or feet Osteonecrosis of the jaw--pain, swelling, or redness in the mouth, numbness of the jaw, poor healing after dental work, unusual discharge from the mouth, visible bones in the mouth Severe bone, joint, or muscle pain Skin infection--skin redness, swelling, warmth, or pain Side effects that usually do not require medical attention (report these to your care team if they continue or are bothersome): Back pain Headache Joint pain Muscle pain Pain in the hands, arms, legs, or feet Runny or stuffy nose Sore throat This list may not describe all possible side effects. Call your doctor for medical advice about side effects. You may report side effects to FDA at 1-800-FDA-1088. Where should I keep my medication? This medication is given in a hospital or clinic. It will not be stored at home. NOTE: This sheet is a summary. It may not cover all possible information. If you have questions about this medicine, talk to your doctor, pharmacist, or health care provider.  2024 Elsevier/Gold Standard (2022-01-24 00:00:00)

## 2023-09-18 NOTE — Progress Notes (Signed)
 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  She was treated with left mastectomy followed by adjuvant chemotherapy and radiation therapy.  She continues adjuvant endocrine therapy with anastrozole  1 mg daily with plans for at least 5 years of treatment.  She remains without evidence of recurrence.    Osteopenia after menopause Osteopenia.  Bone density scan at [facility] in February 2023 revealed a T-score of -1.6 in the spine and a T-score of 0.1 in the femur.  She had compression fractures of T10 and L2 and L3, February and March 2024, from severe falls. She had kyphoplasty of T10 in April at [facility].  She now is a cervical fusion for degenerative disease.  She was started on Prolia  injections every 6 months in March 2024 after spinal fracture.  At her last visit, she was not taking calcium, only high dose vitamin D weekly, so she was started on calcium.  She now has mild hypercalcemia, so we will discontinue oral calcium.  She will proceed with Prolia  today.  She had a bone density scan in March, 2025 which showed minimal osteopenia of the spine with a T-score of -1.1 and normal femur. This is improved from prior bone density.   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 She can start out daily but then 2-3 times weekly should be sufficient.    Plan: She was seen by an urologist who placed her on Keflex daily for recurrent UTI's, and now Cipro  but to no avail. I will prescribe a premarin  cream to use 2-3 times a week and explained what  this is, how to use it, and that it is safe. She continues Anastrozole  1 mg daily without difficulty and her screening right mammogram is scheduled for next month. She informed me that her PCP informed her that she has pulmonary fibrosis and she believes it could have developed from when she had COVID. I could not find any record of this within her scans or history. She had a bone density scan in March, 2025 which showed minimal osteopenia of the spine with a T-score of -1.1 and normal femur. This is improved from prior bone density. She has a WBC of 6.9, hemoglobin of 14.1, and platelet count of 321,000. Her CMP is fairly normal other than an elevated creatinine of 1.15 and calcium of 10.5, improved from 11.3. She will receive her Prolia  injection today. I will see her back in 6 months with CBC, CMP, and Prolia  injection. The patient understands the plans discussed today and is in agreement with them.  She knows to contact our office if she develops concerns prior to her next appointment.  I provided 23 minutes of face-to-face time during this encounter and > 50% was spent counseling as documented under my assessment and plan.   [name], MD  [facility] - A DEPT OF [facility] [address] [place] [address] Dept: [phone] Dept Fax: [phone]   No orders of the defined types were placed in this encounter.  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 She was treated with left mastectomy.  One node was positive for malignancy. EndoPredict revealed EP clin score of  5.4, which corresponds with high risk for recurrence of 55% within the next 10 years.  The estimated chemotherapy benefit was as 27%.  She was treated with doxorubicin /cyclophosphamide  for 4 cycles completed in May 2024.  She then received weekly paclitaxel  for 8 of 12 planned cycles.  She had interruptions for various complications, including meningitis.  Chemotherapy was discontinued in September 2023 due to complications.  She was placed on hormonal therapy with anastrozole  in October 2023.  She continues to follow with Dr. Bert.  Right screening mammogram and September 2023 did not reveal any evidence of malignancy.   Bone density scan at [facility] in February 2023 revealed a T-score of -1.6 in the spine and a T-score of 0.1 in the femur.  She had compression fractures of T10 and L2 and L3, February and March 2024, from severe falls. She had kyphoplasty of T10 in April at [facility].  She now is a cervical fusion for degenerative disease.  She was started on Prolia  injections every 6 months in March 2024.  She continues to follow with Dr. Dalia and is due for right screening mammogram in September.  Oncology History  Malignant neoplasm of central portion of left breast in female, estrogen receptor positive (HCC)  09/16/2020 Mammogram   DIGITAL SCREENING BILATERAL MAMMOGRAM: In the left breast, a possible mass warrants further evaluation. In the right breast, no findings suspicious for malignancy.    10/08/2020 Mammogram   DIAGNOSTIC UNILATERAL LEFT MAMMOGRAM AND LEFT BREAST ULTRASOUND: There is architectural distortion at the left breast 12 o'clock.  Targeted ultrasound is performed, showing no focal abnormal discrete cystic or solid lesion at left breast 12 o'clock the correlate to the mammographic finding. Ultrasound of the left axilla is negative.   10/20/2020 Pathology Results   Breast, left, needle core biopsy, 12 o'clock:             Invasive mammary carcinoma              Mammary carcinoma in situ HER2: Negative (0) ER: Positive 30% PR: Negative 0% Ki67: 1%   10/26/2020 Initial Diagnosis   Malignant neoplasm of central portion of left breast in female, estrogen receptor positive (HCC)   12/17/2020 Cancer Staging   Staging form: Breast, AJCC 8th Edition - Clinical stage from 12/17/2020: Stage IIA (cT1b, cN1(sn), cM0, G2, ER+, PR-, HER2-) - Signed by Cornelius Wanda DEL, MD on 02/13/2021 Histopathologic type: Infiltrating duct carcinoma, NOS Stage prefix: Initial diagnosis Method of lymph node assessment: Sentinel lymph node biopsy Nuclear grade: G2 Multigene prognostic tests performed: EndoPredict Histologic grading system: 3 grade system Laterality: Left Tumor size (mm): 7 Lymph-vascular invasion (LVI): LVI not present (absent)/not identified Diagnostic confirmation: Positive histology Specimen type: Excision Staged by: Managing physician Menopausal status: Postmenopausal Ki-67 (%): 1 EndoPredict EPclin risk score: 5.4 EndoPredict EPclin risk level: High risk EndoPredict 10-year percentage risk of distant recurrence (%): 55 EndoPredict 10-year risk of distant recurrence: High risk Stage used in treatment planning: Yes National guidelines used in treatment  planning: Yes Type of national guideline used in treatment planning: NCCN Staging comments: Endopredict with 27% benefit from chemotherapy   12/17/2020 Pathology Results   Breast, simple mastectomy, left:         Invasive ductal carcinoma, grade 2, 7 mm, and ductal carcinoma in situ         Ribbon clip and biopsy reaction present         All margins negative for invasive carcinoma and DCIS         Fibrocystic changes Lymph node, sentinel, biopsy, left axillary:         Tumor present in regional lymph node (1/1)   02/18/2021 - 08/30/2021 Chemotherapy   Patient is on Treatment Plan : Breast AC q21 Days / PACLitaxel  q7d      Genetic Testing   Negative genetic testing. No pathogenic variants  identified on the [facility] Multi-Cancer+RNA panel. The report date is 03/08/2021.  The Multi-Cancer Panel + RNA offered by [facility] includes sequencing and/or deletion duplication testing of the following 84 genes: AIP, ALK, APC, ATM, AXIN2,BAP1,  BARD1, BLM, BMPR1A, BRCA1, BRCA2, BRIP1, CASR, CDC73, CDH1, CDK4, CDKN1B, CDKN1C, CDKN2A (p14ARF), CDKN2A (p16INK4a), CEBPA, CHEK2, CTNNA1, DICER1, DIS3L2, EGFR (c.2369C>T, p.Thr790Met variant only), EPCAM (Deletion/duplication testing only), FH, FLCN, GATA2, GPC3, GREM1 (Promoter region deletion/duplication testing only), HOXB13 (c.251G>A, p.Gly84Glu), HRAS, KIT, MAX, MEN1, MET, MITF (c.952G>A, p.Glu318Lys variant only), MLH1, MSH2, MSH3, MSH6, MUTYH, NBN, NF1, NF2, NTHL1, PALB2, PDGFRA, PHOX2B, PMS2, POLD1, POLE, POT1, PRKAR1A, PTCH1, PTEN, RAD50, RAD51C, RAD51D, RB1, RECQL4, RET, RUNX1, SDHAF2, SDHA (sequence changes only), SDHB, SDHC, SDHD, SMAD4, SMARCA4, SMARCB1, SMARCE1, STK11, SUFU, TERC, TERT, TMEM127, TP53, TSC1, TSC2, VHL, WRN and WT1.   02/18/2021 - 09/07/2021 Chemotherapy   Patient is on Treatment Plan : Breast AC q21 Days / PACLitaxel  q7d      INTERVAL HISTORY:  Sharika is here today for repeat clinical assessment for her stage IIA hormone receptor positive breast cancer diagnosed in October, 2022 and osteopenia after menopause with fracture. Patient states that she feels well but complains of pain in her right axilla, recurrent UTI's, and right hip/leg pain rating 4/10. She was seen by an urologist who placed her on Keflex daily and now Cipro  but to no avail. I will prescribe a premarin  cream to use 2-3 times a week and explained what this is to her. She continues Anastrozole  1mg  without difficulty and her screening right mammogram is scheduled for next month. She informed me that her PCP informed her that she has pulmonary fibrosis and she believes it could have developed from when she had COVID. I could not find any record of this within her scans or  history. She had a bone density scan in march, 2025 which showed osteopenia of the spine with a T-score of -1.1 and normal femur. This is improved from prior bone density. She has a WBC of 6.9, hemoglobin of 14.1, and platelet count of 321,000. Her CMP is fairly normal other than an elevated creatinine of 1.15 and calcium of 10.5 improved from 11.3. She will receive her Prolia  injection today. I will see her back in 6 months with CBC, CMP, and Prolia  injection.   She denies fever, chills, night sweats, or other signs of infection. She denies cardiorespiratory and gastrointestinal issues. Her appetite is good and Her weight has increased 1 pounds over last 6 months. This patient is accompanied in the office by her granddaughter.   REVIEW OF SYSTEMS:  Review of Systems  Constitutional:  Negative  for appetite change, chills, fatigue, fever and unexpected weight change.  HENT:  Negative.  Negative for lump/mass, mouth sores and sore throat.   Eyes: Negative.   Respiratory: Negative.  Negative for chest tightness, cough, hemoptysis, shortness of breath and wheezing.   Cardiovascular: Negative.  Negative for chest pain, leg swelling and palpitations.  Gastrointestinal: Negative.  Negative for abdominal distention, abdominal pain, blood in stool, constipation, diarrhea, nausea and vomiting.  Endocrine: Negative.  Negative for hot flashes.  Genitourinary:  Positive for bladder incontinence. Negative for difficulty urinating, dysuria, frequency and hematuria.        Recurrent UTI's  Musculoskeletal:  Negative for arthralgias, back pain, flank pain, gait problem and myalgias.       Right hip/leg rating 4/10 Pain in her right axilla  Skin: Negative.  Negative for rash.  Neurological:  Positive for dizziness. Negative for extremity weakness, gait problem, headaches, light-headedness, numbness, seizures and speech difficulty.  Hematological: Negative.  Negative for adenopathy. Does not bruise/bleed easily.   Psychiatric/Behavioral: Negative.  Negative for depression and sleep disturbance. The patient is not nervous/anxious.     VITALS:  Blood pressure 109/78, pulse 97, temperature 97.9 F (36.6 C), temperature source Oral, resp. rate 18, height 5' 5 (1.651 m), weight 177 lb 8 oz (80.5 kg), last menstrual period 09/02/1998, SpO2 98%.  Wt Readings from Last 3 Encounters:  09/18/23 177 lb 8 oz (80.5 kg)  05/17/23 183 lb (83 kg)  03/16/23 176 lb 6 oz (80 kg)    Body mass index is 29.54 kg/m.  Performance status (ECOG): 1 - Symptomatic but completely ambulatory  PHYSICAL EXAM:  Physical Exam Vitals and nursing note reviewed.  Constitutional:      General: She is not in acute distress.    Appearance: Normal appearance.  HENT:     Head: Normocephalic and atraumatic.     Mouth/Throat:     Mouth: Mucous membranes are moist.     Pharynx: Oropharynx is clear. No oropharyngeal exudate or posterior oropharyngeal erythema.  Eyes:     General: No scleral icterus.    Extraocular Movements: Extraocular movements intact.     Conjunctiva/sclera: Conjunctivae normal.     Pupils: Pupils are equal, round, and reactive to light.  Neck:     Comments: Anterior cervical scar which is well healed Cardiovascular:     Rate and Rhythm: Normal rate and regular rhythm.     Heart sounds: Normal heart sounds. No murmur heard.    No friction rub. No gallop.  Pulmonary:     Effort: Pulmonary effort is normal.     Breath sounds: Normal breath sounds. No wheezing, rhonchi or rales.  Chest:  Breasts:    Right: Normal. No swelling, bleeding, inverted nipple, mass, nipple discharge, skin change or tenderness.     Left: Absent.     Comments: Scar in the lateral right breast which is well healed No masses in the right breast Left mastectomy is negative Abdominal:     General: There is no distension.     Palpations: Abdomen is soft. There is no hepatomegaly, splenomegaly or mass.     Tenderness: There is no  abdominal tenderness.  Musculoskeletal:        General: Normal range of motion.     Cervical back: Normal range of motion and neck supple. No tenderness.     Thoracic back: No tenderness (lower right lateral).     Right lower leg: No edema.     Left lower leg:  No edema.  Lymphadenopathy:     Cervical: No cervical adenopathy.     Upper Body:     Right upper body: No supraclavicular or axillary adenopathy.     Left upper body: No supraclavicular or axillary adenopathy.     Lower Body: No right inguinal adenopathy. No left inguinal adenopathy.  Skin:    General: Skin is warm and dry.     Coloration: Skin is not jaundiced.     Findings: No rash.  Neurological:     Mental Status: She is alert and oriented to person, place, and time.     Cranial Nerves: No cranial nerve deficit.  Psychiatric:        Mood and Affect: Mood normal.        Behavior: Behavior normal.        Thought Content: Thought content normal.    LABS:      Latest Ref Rng & Units 09/18/2023    3:14 PM 03/06/2023    1:47 PM 08/29/2022    2:23 PM  CBC  WBC 4.0 - 10.5 K/uL 6.9  6.9  4.8   Hemoglobin 12.0 - 15.0 g/dL 85.8  85.5  86.6   Hematocrit 36.0 - 46.0 % 41.6  42.1  41.7   Platelets 150 - 400 K/uL 321  292  287       Latest Ref Rng & Units 09/18/2023    3:14 PM 03/06/2023    1:47 PM 08/29/2022    2:23 PM  CMP  Glucose 70 - 99 mg/dL 79  869  877   BUN 8 - 23 mg/dL 16  21  16    Creatinine 0.44 - 1.00 mg/dL 8.84  8.77  9.02   Sodium 135 - 145 mmol/L 140  140  140   Potassium 3.5 - 5.1 mmol/L 3.9  4.2  4.1   Chloride 98 - 111 mmol/L 101  103  104   CO2 22 - 32 mmol/L 25  25  27    Calcium 8.9 - 10.3 mg/dL 89.4  88.6  9.9   Total Protein 6.5 - 8.1 g/dL 7.9  7.7  7.4   Total Bilirubin 0.0 - 1.2 mg/dL 0.5  0.5  0.6   Alkaline Phos 38 - 126 U/L 39  40  28   AST 15 - 41 U/L 30  33  31   ALT 0 - 44 U/L 24  24  28     STUDIES:  EXAM: 03/23/2023  DUAL X-RAY ABSORPTIOMETRY (DXA) FOR BONE MINERAL  DENSITY Impression: AP Spine L1-L4 (L2,L3) 03/23/2023 66.7 Low Bone Mass -1.1 1.037 g/cm2 DualFemur Neck Left 03/23/2023 66.7 Normal -0.8 0.923 g/cm2 DualFemur Total Mean 03/23/2023 66.7 Normal 0.3 1.046 g/cm2  HISTORY:   Past Medical History:  Diagnosis Date   Abnormal mammogram of left breast 11/05/2018   Allergic rhinitis 08/30/2013   Altered consciousness 09/29/2020   Aortic atherosclerosis 04/10/2022   Arthritis    Atypical ductal hyperplasia of right breast 05/21/2017   Bronchitis due to COVID-19 virus 12/15/2021   Tested positive for COVID-19     Cardiac murmur 09/23/2019   Cervical radiculopathy 08/22/2022   Cervical spinal stenosis 07/31/2022   Chronic kidney disease    recurrent kidney infections   Closed wedge compression fracture of L2 vertebra (HCC) 02/14/2022   Closed wedge compression fracture of T10 vertebra (HCC) 03/23/2022   Coccydynia 03/24/2015   Degeneration of thoracic or thoracolumbar intervertebral disc 02/12/2013   Depression with anxiety 08/30/2013   Diet-controlled diabetes  mellitus (HCC) 10/31/2019   Difficulty in walking 08/30/2013   Disorder of sacrum 02/12/2013   Dyspnea on exertion 10/29/2020   Epilepsy (HCC) 08/30/2013   Excessive daytime sleepiness 07/03/2017   Family history of breast cancer    Family history of colon cancer    Family history of first degree relative with dementia 10/26/2017   Family history of lung cancer    Family history of prostate cancer    Family history of uterine cancer    Fibromyalgia    Gait abnormality 07/10/2012   Genetic testing 03/09/2021   Negative genetic testing. No pathogenic variants identified on the [facility] Multi-Cancer+RNA panel. The report date is 03/08/2021.  The Multi-Cancer Panel + RNA offered by [facility] includes sequencing and/or deletion duplication testing of the following 84 genes: AIP, ALK, APC, ATM, AXIN2,BAP1,  BARD1, BLM, BMPR1A, BRCA1, BRCA2, BRIP1, CASR, CDC73, CDH1, CDK4, CDKN1B, CDKN1C,  CDKN2A (p14ARF), CDKN2A (   Herniated cervical disc 07/31/2022   Herpes zoster with nervous system complications 02/12/2013   History of left mastectomy 10/02/2022   History of seizure disorder 03/17/2019   Hypercholesteremia    Hyperlipidemia 08/30/2013   IBS (irritable bowel syndrome)    Insomnia 09/07/2015   Lumbar transverse process fracture (HCC) 02/14/2022   Malignant neoplasm of central portion of left breast in female, estrogen receptor positive (HCC) 10/26/2020   Mass of upper outer quadrant of left breast 05/21/2017   Medication management 01/31/2019   Migraine headache 08/30/2013   Mild cognitive impairment 03/17/2019   Muscle weakness (generalized) 07/25/2021   Myalgia and myositis 02/12/2013   Neurogenic bladder 05/21/2020   Neuropathic pain 07/22/2020   Neuropathy 03/17/2019   Obesity (BMI 30.0-34.9) 03/17/2019   Obesity, Class I, BMI 30-34.9 03/17/2019   Obstructive sleep apnea (adult) (pediatric) 08/30/2013   Osteopenia after menopause 02/10/2021   Pain in thoracic spine 02/12/2013   Palpitations 09/23/2019   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   Postlaminectomy syndrome, lumbar region 02/12/2013   Postoperative examination 06/19/2017   Postural hypotension 09/23/2019   Recurrent urinary tract infection 08/29/2021   Restless legs syndrome 05/22/2017   Risk for falls 09/07/2015   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   Seizures (HCC)    Sleep apnea    nightly cpap   Spondylosis of thoracic region without myelopathy or radiculopathy 02/12/2013   Spondylosis without myelopathy or radiculopathy, cervical region 07/31/2022   Syncope and collapse 03/04/2022   Thoracic or lumbosacral neuritis or radiculitis 02/12/2013   Thoracic spondylosis with myelopathy 02/12/2013   Tremors of nervous system    Urinary incontinence 03/17/2019   Urinary incontinence, mixed 05/29/2013   Vitamin B 12 deficiency 03/17/2019   Vitamin D  deficiency 03/17/2019   Weakness 07/10/2012    Past Surgical History:  Procedure Laterality Date   BLADDER REPAIR  03/13/2018   BLADDER SUSPENSION  03/13/2018   BREAST LUMPECTOMY  2019   CERVICAL FUSION  12/2022   COLONOSCOPY  12/20/2012   Left colonic diverticulosis predominantly in the sigmoid colon. Small internal hemmorhoids. Otherwise normal colonoscopy.    LAMINECTOMY  1998    Family History  Problem Relation Age of Onset   Hypercholesterolemia Mother    Alzheimer's disease Mother    Heart Problems Father    Clotting disorder Father    Alzheimer's disease Father    Prostate cancer Father        dx 67s   Allergic rhinitis Sister    Colon cancer Maternal Grandfather  9s   Brain cancer Paternal Grandfather        20s   Uterine cancer Maternal Aunt        dx 15s, d. 29   Breast cancer Maternal Aunt 60       recurrence, metastatic, d. 22   Hemophilia Maternal Aunt    Breast cancer Maternal Aunt        dx 4-s d. 50s   Lung cancer Maternal Uncle 50       smoker   Lung cancer Maternal Uncle        60s   Breast cancer Paternal Aunt 51       dx 80s d. 46, 3 recurrences   Liver cancer Paternal Uncle    Kidney cancer Paternal Uncle    Colon cancer Cousin    Breast cancer Cousin        dx 20s, bilateral mastectomy   Esophageal cancer Neg Hx    Stomach cancer Neg Hx    Rectal cancer Neg Hx     Social History:  reports that she has never smoked. She has never used smokeless tobacco. She reports that she does not drink alcohol and does not use drugs.The patient is accompanied by her granddaughter today.  Allergies:  Allergies  Allergen Reactions   Nitrofurantoin Anaphylaxis, Dermatitis, Diarrhea, Other (See Comments), Hives, Itching, Nausea Only, Palpitations, Photosensitivity, Rash, Shortness Of Breath, Swelling, Tinitus and Nausea And Vomiting    Other Reaction(s): Fatique  Other Reaction(s): Abdominal Pain, Confusion, Confusion (intolerance), Dermatitis,  Dermatitis, Lethargy (intolerance), Other, Seizures   Sulfa Antibiotics Hives, Other (See Comments) and Rash    Other reaction(s): Rash  Other Reaction(s): Other (See Comments)    Other reaction(s): Rash    Current Medications: Current Outpatient Medications  Medication Sig Dispense Refill   albuterol (VENTOLIN HFA) 108 (90 Base) MCG/ACT inhaler Inhale into the lungs every 4 (four) hours as needed for wheezing or shortness of breath.     anastrozole  (ARIMIDEX ) 1 MG tablet TAKE 1 TABLET BY MOUTH EVERY DAY 90 tablet 3   cephALEXin (KEFLEX) 500 MG capsule Take 500 mg by mouth daily.     chlorhexidine (PERIDEX) 0.12 % solution Use as directed 5 mLs in the mouth or throat as directed.     ciprofloxacin  (CIPRO ) 500 MG tablet Take 1 tablet (500 mg total) by mouth 2 (two) times daily. 20 tablet 0   cyclobenzaprine (FLEXERIL) 10 MG tablet Take 10 mg by mouth 3 (three) times daily as needed for muscle spasms.     denosumab  (XGEVA ) 120 MG/1.7ML SOLN injection Inject 120 mg into the skin every 6 (six) months.     dicyclomine (BENTYL) 10 MG capsule Take 10 mg by mouth 3 (three) times daily as needed for pain. With IBS     DULoxetine (CYMBALTA) 60 MG capsule Take 60 mg by mouth.     EPINEPHrine 0.3 mg/0.3 mL IJ SOAJ injection Inject 0.3 mg into the muscle as needed for anaphylaxis.     ezetimibe (ZETIA) 10 MG tablet Take 10 mg by mouth daily.     fenofibrate 160 MG tablet Take 160 mg by mouth daily.     gabapentin (NEURONTIN) 600 MG tablet Take 1,200 mg by mouth 3 (three) times daily.     levETIRAcetam (KEPPRA) 750 MG tablet Take 1.5 tablets by mouth 2 (two) times daily.     lidocaine (LIDODERM) 5 % Place 1 patch onto the skin daily.     LINZESS  72 MCG capsule TAKE  1 CAPSULE BY MOUTH DAILY BEFORE BREAKFAST. 90 capsule 4   meloxicam (MOBIC) 15 MG tablet Take 15 mg by mouth daily.     midodrine  (PROAMATINE ) 10 MG tablet Take 1 tablet (10 mg total) by mouth 3 (three) times daily. 270 tablet 3    mirabegron ER (MYRBETRIQ) 50 MG TB24 tablet Take 1 tablet by mouth daily.     omeprazole (PRILOSEC OTC) 20 MG tablet Take 1 tablet by mouth daily.     traMADol (ULTRAM) 50 MG tablet Take 50 mg by mouth every 6 (six) hours as needed for moderate pain (pain score 4-6) or severe pain (pain score 7-10).     traZODone (DESYREL) 50 MG tablet Take 50 mg by mouth at bedtime.     Vitamin D, Ergocalciferol, (DRISDOL) 1.25 MG (50000 UT) CAPS capsule TAKE 1 CAPSULE BY MOUTH ONCE A WEEK FOR 30 DAYS     vitamin E 1000 UNIT capsule Take 1,000 Units by mouth daily.     zinc gluconate 50 MG tablet Take 50 mg by mouth daily.     conjugated estrogens  (PREMARIN ) vaginal cream Place 1 Applicatorful vaginally daily. 42.5 g 12   No current facility-administered medications for this visit.    I,Jasmine M Lassiter,acting as a scribe for [name], MD.,have documented all relevant documentation on the behalf of [name], MD,as directed by  [name], MD while in the presence of [name], MD.

## 2023-09-18 NOTE — Telephone Encounter (Signed)
 Patient has been scheduled for follow-up visit per 09/18/23 LOS.  Pt given an appt calendar with date and time.

## 2023-09-26 ENCOUNTER — Encounter: Payer: Self-pay | Admitting: Hematology and Oncology

## 2023-10-10 ENCOUNTER — Encounter: Payer: Self-pay | Admitting: Hematology and Oncology

## 2023-10-11 ENCOUNTER — Encounter: Payer: Self-pay | Admitting: Cardiology

## 2023-10-11 ENCOUNTER — Ambulatory Visit: Attending: Cardiology | Admitting: Cardiology

## 2023-10-11 VITALS — BP 103/69 | HR 92 | Ht 65.0 in | Wt 179.4 lb

## 2023-10-11 DIAGNOSIS — E66811 Obesity, class 1: Secondary | ICD-10-CM | POA: Diagnosis present

## 2023-10-11 DIAGNOSIS — G4730 Sleep apnea, unspecified: Secondary | ICD-10-CM | POA: Diagnosis not present

## 2023-10-11 DIAGNOSIS — I700 Atherosclerosis of aorta: Secondary | ICD-10-CM | POA: Insufficient documentation

## 2023-10-11 DIAGNOSIS — E782 Mixed hyperlipidemia: Secondary | ICD-10-CM | POA: Diagnosis not present

## 2023-10-11 NOTE — Patient Instructions (Signed)

## 2023-10-11 NOTE — Progress Notes (Signed)
 Cardiology Office Note:    Date:  10/11/2023   ID:  Brandy Maxwell, DOB 1955/02/18, MRN [id number]  PCP:  Sabas Norleen PARAS., MD  Cardiologist:  Jennifer JONELLE Crape, MD   Referring MD: Sabas Norleen PARAS., MD    ASSESSMENT:    1. Mixed hyperlipidemia   2. Aortic atherosclerosis   3. Sleep apnea, unspecified type   4. Obesity (BMI 30.0-34.9)    PLAN:    In order of problems listed above:  Primary prevention stressed with the patient.  Importance of compliance with diet medication stressed and patient verbalized standing. She was advised to ambulate to the best of her ability. Orthostatic hypotension: On medications and handling it well.  She has had no low blood pressure issues in the past several months and happy about it. Mixed dyslipidemia: On lipid-lowering medications followed by primary care.  Goal LDL less than 60.  She has aortic atherosclerosis.  She is intolerant to statin therapy. Aortic atherosclerosis: Patient is aware and will monitor. Obesity: Weight reduction stressed diet emphasized and she promises to do better.  Risks of obesity explained.  Weight reduction stressed. Patient will be seen in follow-up appointment in 6 months or earlier if the patient has any concerns.    Medication Adjustments/Labs and Tests Ordered: Current medicines are reviewed at length with the patient today.  Concerns regarding medicines are outlined above.  Orders Placed This Encounter  Procedures   EKG 12-Lead   No orders of the defined types were placed in this encounter.    No chief complaint on file.    History of Present Illness:    Brandy Maxwell is a 68 y.o. female.  Patient has past medical history of hypertension, mixed dyslipidemia, aortic atherosclerosis sleep apnea and obesity.  She denies any problems at this time and takes care of activities of daily living.  No chest pain orthopnea or PND.  She tells me that she has frequent falls and her primary care is aware of this.  No  syncope.  At the time of my evaluation, the patient is alert awake oriented and in no distress.  She tells me that her blood pressure is stable....  Past Medical History:  Diagnosis Date   Abnormal mammogram of left breast 11/05/2018   Allergic rhinitis 08/30/2013   Altered consciousness 09/29/2020   Aortic atherosclerosis 04/10/2022   Arthritis    Atypical ductal hyperplasia of right breast 05/21/2017   Bronchitis due to COVID-19 virus 12/15/2021   Tested positive for COVID-19     Cardiac murmur 09/23/2019   Cervical radiculopathy 08/22/2022   Cervical spinal stenosis 07/31/2022   Chronic kidney disease    recurrent kidney infections   Closed wedge compression fracture of L2 vertebra (HCC) 02/14/2022   Closed wedge compression fracture of T10 vertebra (HCC) 03/23/2022   Coccydynia 03/24/2015   Degeneration of thoracic or thoracolumbar intervertebral disc 02/12/2013   Depression with anxiety 08/30/2013   Diet-controlled diabetes mellitus (HCC) 10/31/2019   Difficulty in walking 08/30/2013   Disorder of sacrum 02/12/2013   Dyspnea on exertion 10/29/2020   Epilepsy (HCC) 08/30/2013   Excessive daytime sleepiness 07/03/2017   Family history of breast cancer    Family history of colon cancer    Family history of first degree relative with dementia 10/26/2017   Family history of lung cancer    Family history of prostate cancer    Family history of uterine cancer    Fibromyalgia    Gait abnormality 07/10/2012   Genetic  testing 03/09/2021   Negative genetic testing. No pathogenic variants identified on the [facility] Multi-Cancer+RNA panel. The report date is 03/08/2021.  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   History of left mastectomy 10/02/2022   History of seizure disorder 03/17/2019   Hypercholesteremia    Hyperlipidemia 08/30/2013   IBS (irritable bowel syndrome)    Insomnia 09/07/2015   Lumbar transverse process fracture (HCC) 02/14/2022   Malignant neoplasm of central portion of left breast in female, estrogen receptor positive (HCC) 10/26/2020   Mass of upper outer quadrant of left breast 05/21/2017   Medication management 01/31/2019   Migraine headache 08/30/2013   Mild cognitive impairment 03/17/2019   Muscle weakness (generalized) 07/25/2021   Myalgia and myositis 02/12/2013   Neurogenic bladder 05/21/2020   Neuropathic pain 07/22/2020   Neuropathy 03/17/2019   Obesity (BMI 30.0-34.9) 03/17/2019   Obesity, Class I, BMI 30-34.9 03/17/2019   Obstructive sleep apnea (adult) (pediatric) 08/30/2013   Osteopenia after menopause 02/10/2021   Pain in thoracic spine 02/12/2013   Palpitations 09/23/2019   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   Postlaminectomy syndrome, lumbar region 02/12/2013   Postoperative examination 06/19/2017   Postural hypotension 09/23/2019   Recurrent urinary tract infection 08/29/2021   Restless legs syndrome 05/22/2017   Risk for falls 09/07/2015   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   Seizures (HCC)    Sleep apnea    nightly cpap   Spondylosis of thoracic region without myelopathy or radiculopathy 02/12/2013   Spondylosis without myelopathy or radiculopathy, cervical region 07/31/2022   Syncope and collapse 03/04/2022   Thoracic or lumbosacral neuritis or radiculitis 02/12/2013   Thoracic spondylosis with myelopathy 02/12/2013   Tremors of nervous system    Urinary incontinence 03/17/2019   Urinary incontinence, mixed 05/29/2013   Vitamin B 12 deficiency 03/17/2019   Vitamin D deficiency 03/17/2019   Weakness 07/10/2012    Past Surgical History:   Procedure Laterality Date   BLADDER REPAIR  03/13/2018   BLADDER SUSPENSION  03/13/2018   BREAST LUMPECTOMY  2019   CERVICAL FUSION  12/2022   COLONOSCOPY  12/20/2012   Left colonic diverticulosis predominantly in the sigmoid colon. Small internal hemmorhoids. Otherwise normal colonoscopy.    LAMINECTOMY  1998    Current Medications: Current Meds  Medication Sig   albuterol (VENTOLIN HFA) 108 (90 Base) MCG/ACT inhaler Inhale into the lungs every 4 (four) hours as needed for wheezing or shortness of breath.   anastrozole  (ARIMIDEX ) 1 MG tablet TAKE 1 TABLET BY MOUTH EVERY DAY   cephALEXin (KEFLEX) 500 MG capsule Take 500 mg by mouth daily.   chlorhexidine (PERIDEX) 0.12 % solution Use as directed 5 mLs in the mouth or throat as directed.   ciprofloxacin  (CIPRO ) 500 MG tablet Take 1 tablet (500 mg total) by mouth 2 (two) times daily.   conjugated estrogens  (PREMARIN ) vaginal cream Place 1 Applicatorful vaginally daily.   cyclobenzaprine (FLEXERIL) 10 MG tablet Take 10 mg by mouth 3 (three) times daily as needed for muscle spasms.   denosumab  (XGEVA ) 120 MG/1.7ML SOLN injection Inject 120 mg into the skin every 6 (six) months.   dicyclomine (BENTYL) 10 MG capsule Take 10 mg by mouth  3 (three) times daily as needed for pain. With IBS   DULoxetine (CYMBALTA) 60 MG capsule Take 60 mg by mouth.   EPINEPHrine 0.3 mg/0.3 mL IJ SOAJ injection Inject 0.3 mg into the muscle as needed for anaphylaxis.   ezetimibe (ZETIA) 10 MG tablet Take 10 mg by mouth daily.   fenofibrate 160 MG tablet Take 160 mg by mouth daily.   gabapentin (NEURONTIN) 600 MG tablet Take 1,200 mg by mouth 3 (three) times daily.   levETIRAcetam (KEPPRA) 750 MG tablet Take 1.5 tablets by mouth 2 (two) times daily.   lidocaine (LIDODERM) 5 % Place 1 patch onto the skin daily.   LINZESS  72 MCG capsule TAKE 1 CAPSULE BY MOUTH DAILY BEFORE BREAKFAST.   meloxicam (MOBIC) 15 MG tablet Take 15 mg by mouth daily.   midodrine   (PROAMATINE ) 10 MG tablet Take 1 tablet (10 mg total) by mouth 3 (three) times daily.   mirabegron ER (MYRBETRIQ) 50 MG TB24 tablet Take 1 tablet by mouth daily.   omeprazole (PRILOSEC OTC) 20 MG tablet Take 1 tablet by mouth daily.   traMADol (ULTRAM) 50 MG tablet Take 50 mg by mouth every 6 (six) hours as needed for moderate pain (pain score 4-6) or severe pain (pain score 7-10).   traZODone (DESYREL) 50 MG tablet Take 50 mg by mouth at bedtime.   Vitamin D, Ergocalciferol, (DRISDOL) 1.25 MG (50000 UT) CAPS capsule TAKE 1 CAPSULE BY MOUTH ONCE A WEEK FOR 30 DAYS   vitamin E 1000 UNIT capsule Take 1,000 Units by mouth daily.   zinc gluconate 50 MG tablet Take 50 mg by mouth daily.     Allergies:   Nitrofurantoin and Sulfa antibiotics   Social History   Socioeconomic History   Marital status: Married    Spouse name: Not on file   Number of children: 6   Years of education: Not on file   Highest education level: Not on file  Occupational History   Not on file  Tobacco Use   Smoking status: Never   Smokeless tobacco: Never  Vaping Use   Vaping status: Never Used  Substance and Sexual Activity   Alcohol use: Never   Drug use: Never   Sexual activity: Not on file  Other Topics Concern   Not on file  Social History Narrative   Not on file   Social Drivers of Health   Financial Resource Strain: Medium Risk (04/18/2022)   Received from [facility]   Overall Financial Resource Strain (CARDIA)    Difficulty of Paying Living Expenses: Somewhat hard  Food Insecurity: No Food Insecurity (12/13/2022)   Received from [facility]   Hunger Vital Sign    Within the past 12 months, you worried that your food would run out before you got the money to buy more.: Never true    Within the past 12 months, the food you bought just didn't last and you didn't have money to get more.: Never true  Transportation Needs: Unmet Transportation Needs (12/13/2022)   Received from [facility] - Transportation    Lack of Transportation (Medical): Yes    Lack of Transportation (Non-Medical): No  Physical Activity: Insufficiently Active (04/18/2022)   [facility] from [facility]   Exercise Vital Sign    On average, how many days per week do you engage in moderate to strenuous exercise (like a brisk walk)?: 3 days    On average, how many minutes do you engage in exercise at  this level?: 10 min  Stress: Stress Concern Present (12/13/2022)   Received from [facility] - Occupational Stress Questionnaire    Feeling of Stress : Very much  Social Connections: Moderately Integrated (04/18/2022)   Received from [facility]   Social Network    How would you rate your social network (family, work, friends)?: Adequate participation with social networks     Family History: The patient's family history includes Allergic rhinitis in her sister; Alzheimer's disease in her father and mother; Brain cancer in her paternal grandfather; Breast cancer in her cousin and maternal aunt; Breast cancer (age of onset: 67) in her paternal aunt; Breast cancer (age of onset: 30) in her maternal aunt; Clotting disorder in her father; Colon cancer in her cousin and maternal grandfather; Heart Problems in her father; Hemophilia in her maternal aunt; Hypercholesterolemia in her mother; Kidney cancer in her paternal uncle; Liver cancer in her paternal uncle; Lung cancer in her maternal uncle; Lung cancer (age of onset: 39) in her maternal uncle; Prostate cancer in her father; Uterine cancer in her maternal aunt. There is no history of Esophageal cancer, Stomach cancer, or Rectal cancer.  ROS:   Please see the history of present illness.    All other systems reviewed and are negative.  EKGs/Labs/Other Studies Reviewed:    The following studies were reviewed today: .SABRAEKG Interpretation Date/Time:  Thursday October 11 2023 15:44:07 EDT Ventricular Rate:  92 PR  Interval:  158 QRS Duration:  74 QT Interval:  362 QTC Calculation: 447 R Axis:   70  Text Interpretation: Normal sinus rhythm Low voltage QRS Cannot rule out Anterior infarct , age undetermined Abnormal ECG No previous ECGs available Confirmed by Edwyna Backers [phone] on 10/11/2023 3:53:49 PM     Recent Labs: 09/18/2023: ALT 24; BUN 16; Creatinine 1.15; Hemoglobin 14.1; Platelet Count 321; Potassium 3.9; Sodium 140  Recent Lipid Panel No results found for: CHOL, TRIG, HDL, CHOLHDL, VLDL, LDLCALC, LDLDIRECT  Physical Exam:    VS:  BP 103/69   Pulse 92   Ht 5' 5 (1.651 m)   Wt 179 lb 6.4 oz (81.4 kg)   LMP 09/02/1998 (Approximate)   SpO2 97%   BMI 29.85 kg/m     Wt Readings from Last 3 Encounters:  10/11/23 179 lb 6.4 oz (81.4 kg)  09/18/23 177 lb 8 oz (80.5 kg)  05/17/23 183 lb (83 kg)     GEN: Patient is in no acute distress HEENT: Normal NECK: No JVD; No carotid bruits LYMPHATICS: No lymphadenopathy CARDIAC: Hear sounds regular, 2/6 systolic murmur at the apex. RESPIRATORY:  Clear to auscultation without rales, wheezing or rhonchi  ABDOMEN: Soft, non-tender, non-distended MUSCULOSKELETAL:  No edema; No deformity  SKIN: Warm and dry NEUROLOGIC:  Alert and oriented x 3 PSYCHIATRIC:  Normal affect   Signed, Backers JONELLE Edwyna, MD  10/11/2023 3:55 PM    [facility]

## 2023-10-18 ENCOUNTER — Other Ambulatory Visit (HOSPITAL_BASED_OUTPATIENT_CLINIC_OR_DEPARTMENT_OTHER): Payer: Self-pay

## 2023-10-18 MED ORDER — SHINGRIX 50 MCG/0.5ML IM SUSR: INTRAMUSCULAR | 1 refills | Status: AC

## 2023-10-18 MED ORDER — CAPVAXIVE 0.5 ML IM SOSY: PREFILLED_SYRINGE | INTRAMUSCULAR | 0 refills | Status: AC

## 2023-10-25 ENCOUNTER — Other Ambulatory Visit (HOSPITAL_BASED_OUTPATIENT_CLINIC_OR_DEPARTMENT_OTHER): Payer: Self-pay

## 2023-10-25 MED ORDER — TRAMADOL HCL 50 MG PO TABS
50.0000 mg | ORAL_TABLET | Freq: Four times a day (QID) | ORAL | 1 refills | Status: AC | PRN
  Filled 2023-10-25: qty 30, 8d supply, fill #0
  Filled 2023-12-31: qty 30, 8d supply, fill #1

## 2023-12-31 ENCOUNTER — Other Ambulatory Visit (HOSPITAL_BASED_OUTPATIENT_CLINIC_OR_DEPARTMENT_OTHER): Payer: Self-pay

## 2024-03-18 ENCOUNTER — Other Ambulatory Visit

## 2024-03-18 ENCOUNTER — Ambulatory Visit: Admitting: Oncology

## 2024-03-18 ENCOUNTER — Ambulatory Visit
